# Patient Record
Sex: Male | Born: 1960
Health system: Southern US, Community
[De-identification: ages and names within clinical notes are randomized; demographics above are authoritative.]

## PROBLEM LIST (undated history)

## (undated) DIAGNOSIS — I639 Cerebral infarction, unspecified: Secondary | ICD-10-CM

## (undated) DIAGNOSIS — M1990 Unspecified osteoarthritis, unspecified site: Secondary | ICD-10-CM

## (undated) DIAGNOSIS — K920 Hematemesis: Secondary | ICD-10-CM

## (undated) DIAGNOSIS — I10 Essential (primary) hypertension: Secondary | ICD-10-CM

## (undated) DIAGNOSIS — K219 Gastro-esophageal reflux disease without esophagitis: Secondary | ICD-10-CM

## (undated) DIAGNOSIS — M720 Palmar fascial fibromatosis [Dupuytren]: Secondary | ICD-10-CM

## (undated) HISTORY — PX: TOTAL HIP ARTHROPLASTY: SHX124

## (undated) HISTORY — PX: COLONOSCOPY: SHX174

## (undated) HISTORY — DX: Gastro-esophageal reflux disease without esophagitis: K21.9

## (undated) HISTORY — DX: Cerebral infarction, unspecified: I63.9

## (undated) HISTORY — DX: Palmar fascial fibromatosis (dupuytren): M72.0

## (undated) HISTORY — DX: Unspecified osteoarthritis, unspecified site: M19.90

## (undated) HISTORY — DX: Hematemesis: K92.0

---

## 2012-07-03 ENCOUNTER — Encounter: Payer: Self-pay | Admitting: Family Medicine

## 2012-07-03 ENCOUNTER — Ambulatory Visit (INDEPENDENT_AMBULATORY_CARE_PROVIDER_SITE_OTHER): Payer: 59 | Admitting: Family Medicine

## 2012-07-03 VITALS — BP 130/90 | HR 65 | Temp 98.1°F | Ht 73.0 in | Wt 193.0 lb

## 2012-07-03 DIAGNOSIS — Z0000 Encounter for general adult medical examination without abnormal findings: Secondary | ICD-10-CM | POA: Insufficient documentation

## 2012-07-03 DIAGNOSIS — Z833 Family history of diabetes mellitus: Secondary | ICD-10-CM

## 2012-07-03 DIAGNOSIS — M720 Palmar fascial fibromatosis [Dupuytren]: Secondary | ICD-10-CM

## 2012-07-03 DIAGNOSIS — Z1211 Encounter for screening for malignant neoplasm of colon: Secondary | ICD-10-CM

## 2012-07-03 DIAGNOSIS — Z125 Encounter for screening for malignant neoplasm of prostate: Secondary | ICD-10-CM

## 2012-07-03 LAB — PSA: PSA: 0.12 ng/mL (ref 0.10–4.00)

## 2012-07-03 LAB — COMPREHENSIVE METABOLIC PANEL
ALT: 21 U/L (ref 0–53)
AST: 21 U/L (ref 0–37)
Albumin: 4.4 g/dL (ref 3.5–5.2)
Alkaline Phosphatase: 56 U/L (ref 39–117)
BUN: 9 mg/dL (ref 6–23)
CO2: 29 mEq/L (ref 19–32)
Calcium: 9.9 mg/dL (ref 8.4–10.5)
Chloride: 101 mEq/L (ref 96–112)
Creatinine, Ser: 1 mg/dL (ref 0.4–1.5)
GFR: 102.62 mL/min (ref >60.00)
Glucose, Bld: 92 mg/dL (ref 70–99)
Potassium: 4.5 mEq/L (ref 3.5–5.1)
Sodium: 140 mEq/L (ref 135–145)
Total Bilirubin: 1 mg/dL (ref 0.3–1.2)
Total Protein: 7.3 g/dL (ref 6.0–8.3)

## 2012-07-03 LAB — LIPID PANEL
Cholesterol: 192 mg/dL (ref 0–200)
HDL: 46.7 mg/dL (ref >39.00)
LDL Cholesterol: 118 mg/dL — ABNORMAL HIGH (ref 0–99)
Total CHOL/HDL Ratio: 4
Triglycerides: 137 mg/dL (ref 0.0–149.0)
VLDL: 27.4 mg/dL (ref 0.0–40.0)

## 2012-07-03 NOTE — Assessment & Plan Note (Signed)
Offered referral.  He'll call back if desired.

## 2012-07-03 NOTE — Progress Notes (Signed)
CPE- See plan.  Routine anticipatory guidance given to patient.  See health maintenance. PSA screening d/w pt.  No FH prostate cancer. D/w patient ZO:[id number] for colon cancer screening, including IFOB vs. colonoscopy.  Risks and benefits of both were discussed and patient voiced understanding.  Pt elects WUJ:WJXB.  FH DM.  No known personal hx.  Due for labs today.  Tetanus <10 years per patient.  Flu shot encouraged.  D/w pt about smoking cessation. He's planning on using Ecig.   R>L dupuytren contracture on the hands.  Offered referral, not bothersome enough to need referral at this point.   PMH and SH reviewed  Meds, vitals, and allergies reviewed.   ROS: See HPI.  Otherwise negative.    GEN: nad, alert and oriented HEENT: mucous membranes moist NECK: supple w/o LA CV: rrr. PULM: ctab, no inc wob ABD: soft, +bs EXT: no edema SKIN: no acute rash R>L dupuytren contracture on the hands noted

## 2012-07-03 NOTE — Assessment & Plan Note (Signed)
Routine anticipatory guidance given to patient. See health maintenance.  PSA screening d/w pt. No FH prostate cancer.  D/w patient UE:[id number] for colon cancer screening, including IFOB vs. colonoscopy. Risks and benefits of both were discussed and patient voiced understanding. Pt elects XBJ:YNWG.  FH DM. No known personal hx. Due for labs today.  Tetanus <10 years per patient.  Flu shot encouraged.  D/w pt about smoking cessation. He's planning on using Ecig.

## 2012-07-03 NOTE — Patient Instructions (Addendum)
Go to the lab on the way out.  We'll contact you with your lab report. I would get a flu shot each fall.   Take care.  Glad to see you.  Work on stopping smoking.

## 2012-07-10 ENCOUNTER — Other Ambulatory Visit: Payer: 59

## 2012-07-10 DIAGNOSIS — Z1211 Encounter for screening for malignant neoplasm of colon: Secondary | ICD-10-CM

## 2012-07-10 LAB — FECAL OCCULT BLOOD, IMMUNOCHEMICAL: Fecal Occult Bld: NEGATIVE

## 2012-08-02 ENCOUNTER — Telehealth: Payer: Self-pay

## 2012-08-02 NOTE — Telephone Encounter (Signed)
Pt request lab results 07/03/12. Pt did not receive letter with lab results; pt request results mailed to verified home address;done.

## 2013-06-04 ENCOUNTER — Other Ambulatory Visit: Payer: Self-pay | Admitting: Orthopedic Surgery

## 2013-06-04 DIAGNOSIS — M720 Palmar fascial fibromatosis [Dupuytren]: Secondary | ICD-10-CM

## 2013-06-04 HISTORY — DX: Palmar fascial fibromatosis (dupuytren): M72.0

## 2013-06-20 ENCOUNTER — Encounter (HOSPITAL_BASED_OUTPATIENT_CLINIC_OR_DEPARTMENT_OTHER): Payer: Self-pay | Admitting: *Deleted

## 2013-06-27 ENCOUNTER — Encounter (HOSPITAL_BASED_OUTPATIENT_CLINIC_OR_DEPARTMENT_OTHER)
Admission: RE | Disposition: A | Discharge status: Home / Self Care | Payer: Self-pay | Source: Ambulatory Visit | Attending: Orthopedic Surgery

## 2013-06-27 ENCOUNTER — Ambulatory Visit (HOSPITAL_BASED_OUTPATIENT_CLINIC_OR_DEPARTMENT_OTHER)
Admission: RE | Admit: 2013-06-27 | Discharge: 2013-06-27 | Disposition: A | Discharge status: Home / Self Care | Payer: BC Managed Care – PPO | Source: Ambulatory Visit | Attending: Orthopedic Surgery | Admitting: Orthopedic Surgery

## 2013-06-27 ENCOUNTER — Ambulatory Visit (HOSPITAL_BASED_OUTPATIENT_CLINIC_OR_DEPARTMENT_OTHER): Payer: BC Managed Care – PPO | Admitting: Anesthesiology

## 2013-06-27 ENCOUNTER — Encounter (HOSPITAL_BASED_OUTPATIENT_CLINIC_OR_DEPARTMENT_OTHER): Payer: Self-pay | Admitting: Anesthesiology

## 2013-06-27 ENCOUNTER — Encounter (HOSPITAL_BASED_OUTPATIENT_CLINIC_OR_DEPARTMENT_OTHER): Payer: Self-pay

## 2013-06-27 DIAGNOSIS — F17200 Nicotine dependence, unspecified, uncomplicated: Secondary | ICD-10-CM | POA: Insufficient documentation

## 2013-06-27 DIAGNOSIS — M20099 Other deformity of finger(s), unspecified finger(s): Secondary | ICD-10-CM | POA: Insufficient documentation

## 2013-06-27 DIAGNOSIS — K219 Gastro-esophageal reflux disease without esophagitis: Secondary | ICD-10-CM | POA: Insufficient documentation

## 2013-06-27 DIAGNOSIS — M720 Palmar fascial fibromatosis [Dupuytren]: Secondary | ICD-10-CM | POA: Insufficient documentation

## 2013-06-27 HISTORY — PX: DUPUYTREN CONTRACTURE RELEASE: SHX1478

## 2013-06-27 LAB — POCT HEMOGLOBIN-HEMACUE: Hemoglobin: 15.8 g/dL (ref 13.0–17.0)

## 2013-06-27 SURGERY — RELEASE, DUPUYTREN CONTRACTURE
Anesthesia: General | Site: Hand | Laterality: Right | Wound class: Clean

## 2013-06-27 MED ORDER — FENTANYL CITRATE 0.05 MG/ML IJ SOLN
INTRAMUSCULAR | Status: DC | PRN
  Administered 2013-06-27 (×4): 25 ug via INTRAVENOUS
  Administered 2013-06-27: 100 ug via INTRAVENOUS

## 2013-06-27 MED ORDER — LIDOCAINE HCL (CARDIAC) 20 MG/ML IV SOLN
INTRAVENOUS | Status: DC | PRN
  Administered 2013-06-27: 100 mg via INTRAVENOUS

## 2013-06-27 MED ORDER — HYDROMORPHONE HCL 2 MG PO TABS: 2.0000 mg | ORAL_TABLET | ORAL | Status: DC | PRN

## 2013-06-27 MED ORDER — LACTATED RINGERS IV SOLN
INTRAVENOUS | Status: DC
  Administered 2013-06-27 (×2): via INTRAVENOUS

## 2013-06-27 MED ORDER — HYDROMORPHONE HCL PF 1 MG/ML IJ SOLN
0.2500 mg | INTRAMUSCULAR | Status: DC | PRN
  Administered 2013-06-27 (×2): 0.5 mg via INTRAVENOUS

## 2013-06-27 MED ORDER — CHLORHEXIDINE GLUCONATE 4 % EX LIQD: 60.0000 mL | Freq: Once | CUTANEOUS | Status: DC

## 2013-06-27 MED ORDER — FENTANYL CITRATE 0.05 MG/ML IJ SOLN: 50.0000 ug | INTRAMUSCULAR | Status: DC | PRN

## 2013-06-27 MED ORDER — MIDAZOLAM HCL 2 MG/2ML IJ SOLN: 1.0000 mg | INTRAMUSCULAR | Status: DC | PRN

## 2013-06-27 MED ORDER — DOXYCYCLINE HYCLATE 100 MG PO TABS: 100.0000 mg | ORAL_TABLET | Freq: Two times a day (BID) | ORAL | Status: DC

## 2013-06-27 MED ORDER — DEXAMETHASONE SODIUM PHOSPHATE 10 MG/ML IJ SOLN
INTRAMUSCULAR | Status: DC | PRN
  Administered 2013-06-27: 10 mg via INTRAVENOUS

## 2013-06-27 MED ORDER — LIDOCAINE HCL 2 % IJ SOLN
INTRAMUSCULAR | Status: DC | PRN
  Administered 2013-06-27: 5 mL

## 2013-06-27 MED ORDER — SILVER SULFADIAZINE 1 % EX CREA
TOPICAL_CREAM | CUTANEOUS | Status: DC | PRN
  Administered 2013-06-27: 1 via TOPICAL

## 2013-06-27 MED ORDER — OXYCODONE HCL 5 MG/5ML PO SOLN: 5.0000 mg | Freq: Once | ORAL | Status: AC | PRN

## 2013-06-27 MED ORDER — ONDANSETRON HCL 4 MG/2ML IJ SOLN
INTRAMUSCULAR | Status: DC | PRN
  Administered 2013-06-27: 4 mg via INTRAVENOUS

## 2013-06-27 MED ORDER — PROPOFOL 10 MG/ML IV BOLUS
INTRAVENOUS | Status: DC | PRN
  Administered 2013-06-27: 200 mg via INTRAVENOUS

## 2013-06-27 MED ORDER — MIDAZOLAM HCL 2 MG/ML PO SYRP: 12.0000 mg | ORAL_SOLUTION | Freq: Once | ORAL | Status: DC | PRN

## 2013-06-27 MED ORDER — MIDAZOLAM HCL 5 MG/5ML IJ SOLN
INTRAMUSCULAR | Status: DC | PRN
  Administered 2013-06-27: 2 mg via INTRAVENOUS

## 2013-06-27 MED ORDER — ONDANSETRON HCL 4 MG/2ML IJ SOLN: 4.0000 mg | Freq: Once | INTRAMUSCULAR | Status: DC | PRN

## 2013-06-27 MED ORDER — OXYCODONE HCL 5 MG PO TABS
5.0000 mg | ORAL_TABLET | Freq: Once | ORAL | Status: AC | PRN
  Administered 2013-06-27: 5 mg via ORAL

## 2013-06-27 SURGICAL SUPPLY — 53 items
BANDAGE ADHESIVE 1X3 (GAUZE/BANDAGES/DRESSINGS) IMPLANT
BANDAGE CONFORM 3  STR LF (GAUZE/BANDAGES/DRESSINGS) IMPLANT
BANDAGE ELASTIC 3 VELCRO ST LF (GAUZE/BANDAGES/DRESSINGS) ×2 IMPLANT
BANDAGE GAUZE ELAST BULKY 4 IN (GAUZE/BANDAGES/DRESSINGS) ×4 IMPLANT
BLADE MINI RND TIP GREEN BEAV (BLADE) ×2 IMPLANT
BLADE SURG 15 STRL LF DISP TIS (BLADE) ×4 IMPLANT
BLADE SURG 15 STRL SS (BLADE) ×4
BNDG ESMARK 4X9 LF (GAUZE/BANDAGES/DRESSINGS) ×2 IMPLANT
BRUSH SCRUB EZ PLAIN DRY (MISCELLANEOUS) ×2 IMPLANT
CLOTH BEACON ORANGE TIMEOUT ST (SAFETY) ×2 IMPLANT
CORDS BIPOLAR (ELECTRODE) ×2 IMPLANT
COVER MAYO STAND STRL (DRAPES) ×2 IMPLANT
COVER TABLE BACK 60X90 (DRAPES) ×2 IMPLANT
CUFF TOURNIQUET SINGLE 18IN (TOURNIQUET CUFF) ×2 IMPLANT
DECANTER SPIKE VIAL GLASS SM (MISCELLANEOUS) IMPLANT
DRAPE EXTREMITY T 121X128X90 (DRAPE) ×2 IMPLANT
DRAPE SURG 17X23 STRL (DRAPES) ×2 IMPLANT
DRSG EMULSION OIL 3X3 NADH (GAUZE/BANDAGES/DRESSINGS) ×4 IMPLANT
GLOVE BIO SURGEON STRL SZ 6.5 (GLOVE) ×2 IMPLANT
GLOVE BIO SURGEON STRL SZ7.5 (GLOVE) ×2 IMPLANT
GLOVE BIOGEL M STRL SZ7.5 (GLOVE) IMPLANT
GLOVE BIOGEL PI IND STRL 7.0 (GLOVE) ×3 IMPLANT
GLOVE BIOGEL PI IND STRL 8 (GLOVE) ×1 IMPLANT
GLOVE BIOGEL PI INDICATOR 7.0 (GLOVE) ×3
GLOVE BIOGEL PI INDICATOR 8 (GLOVE) ×1
GLOVE ECLIPSE 6.5 STRL STRAW (GLOVE) ×4 IMPLANT
GLOVE ORTHO TXT STRL SZ7.5 (GLOVE) ×2 IMPLANT
GOWN BRE IMP PREV XXLGXLNG (GOWN DISPOSABLE) ×4 IMPLANT
GOWN PREVENTION PLUS XLARGE (GOWN DISPOSABLE) ×6 IMPLANT
LOOP VESSEL MAXI BLUE (MISCELLANEOUS) IMPLANT
NEEDLE 27GAX1X1/2 (NEEDLE) ×2 IMPLANT
NEEDLE HYPO 25X1 1.5 SAFETY (NEEDLE) ×2 IMPLANT
NS IRRIG 1000ML POUR BTL (IV SOLUTION) ×2 IMPLANT
PACK BASIN DAY SURGERY FS (CUSTOM PROCEDURE TRAY) ×2 IMPLANT
PAD CAST 3X4 CTTN HI CHSV (CAST SUPPLIES) ×1 IMPLANT
PADDING CAST ABS 4INX4YD NS (CAST SUPPLIES) ×1
PADDING CAST ABS COTTON 4X4 ST (CAST SUPPLIES) ×1 IMPLANT
PADDING CAST COTTON 3X4 STRL (CAST SUPPLIES) ×1
SPLINT PLASTER CAST XFAST 3X15 (CAST SUPPLIES) ×8 IMPLANT
SPLINT PLASTER XTRA FASTSET 3X (CAST SUPPLIES) ×8
SPONGE GAUZE 4X4 12PLY (GAUZE/BANDAGES/DRESSINGS) ×2 IMPLANT
STOCKINETTE 4X48 STRL (DRAPES) ×2 IMPLANT
STRIP CLOSURE SKIN 1/2X4 (GAUZE/BANDAGES/DRESSINGS) IMPLANT
SUT ETHILON 5 0 P 3 18 (SUTURE) ×2
SUT NYLON ETHILON 5-0 P-3 1X18 (SUTURE) ×2 IMPLANT
SUT SILK 4 0 PS 2 (SUTURE) ×4 IMPLANT
SUT VIC AB 4-0 P2 18 (SUTURE) IMPLANT
SYR 3ML 23GX1 SAFETY (SYRINGE) IMPLANT
SYR BULB 3OZ (MISCELLANEOUS) ×2 IMPLANT
SYR CONTROL 10ML LL (SYRINGE) ×2 IMPLANT
TOWEL OR 17X24 6PK STRL BLUE (TOWEL DISPOSABLE) ×4 IMPLANT
TRAY DSU PREP LF (CUSTOM PROCEDURE TRAY) ×2 IMPLANT
UNDERPAD 30X30 INCONTINENT (UNDERPADS AND DIAPERS) ×2 IMPLANT

## 2013-06-27 NOTE — H&P (Signed)
Michael Chung is an 52 y.o. male.   Chief Complaint: Bilateral palmar fibromatosis and Dupuytren's diathesis HPI: Aggressive bilateral palmar fibromatosis leading to finger contractures during the past 12 months.         He is a smoker. There is no history of diabetes mellitus over this a positive family history of type 2          diabetes.  Past Medical History  Diagnosis Date  . Dupuytren contracture 06/2013    right long, ring, small fingers  . GERD (gastroesophageal reflux disease)     uses OTC as needed    Past Surgical History  Procedure Laterality Date  . No past surgeries      Family History  Problem Relation Age of Onset  . Hypertension Father   . Diabetes Father   . Diabetes Mother   . Hypertension Mother    Social History:  reports that he has been smoking Cigarettes.  He has a 10 pack-year smoking history. He has never used smokeless tobacco. He reports that he does not drink alcohol or use illicit drugs.  Allergies: No Known Allergies  No prescriptions prior to admission    Results for orders placed during the hospital encounter of 06/27/13 (from the past 48 hour(s))  POCT HEMOGLOBIN-HEMACUE     Status: None   Collection Time    06/27/13 10:22 AM      Result Value Range   Hemoglobin 15.8  13.0 - 17.0 g/dL   No results found.  Review of Systems  Constitutional: Negative.   HENT: Negative.   Eyes: Negative.   Respiratory: Negative.   Cardiovascular: Negative.   Gastrointestinal: Negative.   Genitourinary: Negative.   Musculoskeletal:       Bilateral Dupuytren's contracture of palms and fingers  Skin: Negative.   Neurological: Negative.   Endo/Heme/Allergies: Negative.   Psychiatric/Behavioral: Negative.     Blood pressure 159/108, pulse 74, temperature 97.7 F (36.5 C), temperature source Oral, resp. rate 20, height 6\' 1"  (1.854 m), weight 86.183 kg (190 lb), SpO2 99.00%. Physical Exam  Constitutional: He is oriented to person, place, and time. He  appears well-developed and well-nourished.  HENT:  Head: Normocephalic and atraumatic.  Eyes: Conjunctivae and EOM are normal. Pupils are equal, round, and reactive to light.  Neck: Neck supple.  Cardiovascular: Normal rate, regular rhythm and normal heart sounds.   Respiratory: Effort normal and breath sounds normal.  GI: Soft. Bowel sounds are normal.  Musculoskeletal: Normal range of motion.  Except for limited extension of fingers bilaterally due to palmar fibromatosis  Neurological: He is alert and oriented to person, place, and time. No cranial nerve deficit.  Skin: Skin is warm and dry.  Bilateral significant palmar fibromatosis  Psychiatric: He has a normal mood and affect. His behavior is normal. Judgment and thought content normal.     Assessment/Plan Bilateral aggressive palmar  fibromatosis consistent with Dupuytren's contracture in smoker and evidence of Dupuytren's diathesis.  Plan:  Resection of Dupuytren's palm R. fibromatosis from right hand. The surgery aftercare potential risks and benefits including severe keloid scar formation have been discussed in detail. There is a small risk for a dystrophic type response i.e. CRPS type I or 2 following the surgery. Questions have been invited and answered in detail.  Zeriah Baysinger JR,Damyan Corne V 06/27/2013, 11:27 AM

## 2013-06-27 NOTE — Anesthesia Postprocedure Evaluation (Signed)
  Anesthesia Post-op Note  Patient: Michael Chung  Procedure(s) Performed: Procedure(s): EXCISION DUPUYTRENS RIGHT LONG AND RING  FINGERS (Right)  Patient Location: PACU  Anesthesia Type:General  Level of Consciousness: awake, alert  and oriented  Airway and Oxygen Therapy: Patient Spontanous Breathing  Post-op Pain: mild  Post-op Assessment: Post-op Vital signs reviewed  Post-op Vital Signs: Reviewed  Complications: No apparent anesthesia complications

## 2013-06-27 NOTE — Op Note (Signed)
[id number] 

## 2013-06-27 NOTE — Brief Op Note (Signed)
06/27/2013  1:45 PM  PATIENT:  Michael Chung  52 y.o. male  PRE-OPERATIVE DIAGNOSIS:  DUPUYTRENS CONTRACTURE RIGHT HAND  POST-OPERATIVE DIAGNOSIS:  DUPUYTRENS CONTRACTURE RIGHT HAND  PROCEDURE:  Procedure(s): EXCISION DUPUYTRENS RIGHT LONG AND RING  FINGERS (Right)  SURGEON:  Surgeon(s) and Role:    * Wyn Forster., MD - Primary    * Tami Ribas, MD - Assisting  PHYSICIAN ASSISTANT:   ASSISTANTS: Surgical technician   ANESTHESIA:   general  EBL:  Total I/O In: 1300 [I.V.:1300] Out: -   BLOOD ADMINISTERED:none  DRAINS: none   LOCAL MEDICATIONS USED:  XYLOCAINE   SPECIMEN:  Biopsy / Limited Resection  DISPOSITION OF SPECIMEN:  PATHOLOGY  COUNTS:  YES  TOURNIQUET:   Total Tourniquet Time Documented: Upper Arm (Right) - 82 minutes Total: Upper Arm (Right) - 82 minutes   DICTATION: .Other Dictation: Dictation Number [id number]  PLAN OF CARE: Discharge to home after PACU  PATIENT DISPOSITION:  PACU - hemodynamically stable.   Delay start of Pharmacological VTE agent (>24hrs) due to surgical blood loss or risk of bleeding: not applicable

## 2013-06-27 NOTE — Anesthesia Procedure Notes (Signed)
Procedure Name: LMA Insertion Date/Time: 06/27/2013 11:48 AM Performed by: Caren Macadam Pre-anesthesia Checklist: Patient identified, Emergency Drugs available, Suction available and Patient being monitored Patient Re-evaluated:Patient Re-evaluated prior to inductionOxygen Delivery Method: Circle System Utilized Preoxygenation: Pre-oxygenation with 100% oxygen Intubation Type: IV induction Ventilation: Mask ventilation without difficulty LMA: LMA inserted LMA Size: 5.0 Number of attempts: 1 Airway Equipment and Method: bite block Placement Confirmation: positive ETCO2 and breath sounds checked- equal and bilateral Tube secured with: Tape Dental Injury: Teeth and Oropharynx as per pre-operative assessment

## 2013-06-27 NOTE — Anesthesia Preprocedure Evaluation (Signed)
Anesthesia Evaluation  Patient identified by MRN, date of birth, ID band Patient awake    Reviewed: Allergy & Precautions, H&P , NPO status , Patient's Chart, lab work & pertinent test results  Airway Mallampati: I TM Distance: >3 FB Neck ROM: Full    Dental  (+) Teeth Intact and Dental Advisory Given   Pulmonary  breath sounds clear to auscultation        Cardiovascular Rhythm:Regular Rate:Normal     Neuro/Psych    GI/Hepatic GERD-  Controlled,  Endo/Other    Renal/GU      Musculoskeletal   Abdominal   Peds  Hematology   Anesthesia Other Findings   Reproductive/Obstetrics                           Anesthesia Physical Anesthesia Plan  ASA: II  Anesthesia Plan: General   Post-op Pain Management:    Induction: Intravenous  Airway Management Planned: LMA  Additional Equipment:   Intra-op Plan:   Post-operative Plan: Extubation in OR  Informed Consent: I have reviewed the patients History and Physical, chart, labs and discussed the procedure including the risks, benefits and alternatives for the proposed anesthesia with the patient or authorized representative who has indicated his/her understanding and acceptance.   Dental advisory given  Plan Discussed with: CRNA, Anesthesiologist and Surgeon  Anesthesia Plan Comments:         Anesthesia Quick Evaluation

## 2013-06-27 NOTE — Transfer of Care (Signed)
Immediate Anesthesia Transfer of Care Note  Patient: Michael Chung  Procedure(s) Performed: Procedure(s): EXCISION DUPUYTRENS RIGHT LONG AND RING  FINGERS (Right)  Patient Location: PACU  Anesthesia Type:General  Level of Consciousness: awake, alert  and oriented  Airway & Oxygen Therapy: Patient Spontanous Breathing and Patient connected to face mask oxygen  Post-op Assessment: Report given to PACU RN, Post -op Vital signs reviewed and stable and Patient moving all extremities  Post vital signs: Reviewed and stable  Complications: No apparent anesthesia complications

## 2013-06-28 ENCOUNTER — Encounter (HOSPITAL_BASED_OUTPATIENT_CLINIC_OR_DEPARTMENT_OTHER): Payer: Self-pay | Admitting: Orthopedic Surgery

## 2013-06-28 NOTE — Op Note (Signed)
Michael Chung, SUAREZ NO.:  [id number]  MEDICAL RECORD NO.:  [id number]  LOCATION:                               FACILITY:  [facility]  PHYSICIAN:  Katy Fitch. Jerico Grisso, M.D. DATE OF BIRTH:  09-26-1961  DATE OF PROCEDURE:  06/27/2013 DATE OF DISCHARGE:  06/27/2013                              OPERATIVE REPORT   PREOPERATIVE DIAGNOSES:  Profound Dupuytren's diathesis involving right and left hands with very significant flexion contracture of right long finger proximal interphalangeal joint and right ring finger proximal interphalangeal joint, metacarpophalangeal joint and significant pretendinous cord in the palm.  POSTOPERATIVE DIAGNOSES:  Profound Dupuytren's diathesis involving right and left hands with very significant flexion contracture of right long finger proximal interphalangeal joint and right ring finger proximal interphalangeal joint, metacarpophalangeal joint and significant pretendinous cord in the palm.  OPERATION: 1. Resection of complex Dupuytren's palmar fibromatosis from right     ring finger and palm with V-Y advancement flap closure. 2. Resection of Dupuytren's palmar fibromatosis from right long     finger.  OPERATING SURGEON:  Katy Fitch. Halei Hanover, MD.  ASSISTING:  Betha Loa, MD  ANESTHESIA:  General by LMA.  SUPERVISING ANESTHESIOLOGIST:  Sheldon Silvan, M.D.  INDICATIONS:  Michael Chung is a 52 year old right-hand dominant gentleman who is referred from [facility] for evaluation and management of severe Dupuytren's diathesis affecting his hands bilaterally.  Mr. Troiano developed this Dupuytren's prior to age 52 and now has a very significant flexion contracture of the MP and PIP joints of the right ring finger and right long finger MP joint.  He had minor extension of the small finger through the pretendinous fibers the natatory ligaments.  Complicating Mr. Karge presentation are number of unusual circumstances.  He is  of [place] heritage and has very severe Dupuytren's disease.  He does not have a known history of keloid formation.  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  We had a detailed informed consent in our office during which we explained to him the Dupuytren's palmar fibromatosis is a poorly understood genetically driven predicament.  He understands that despite our surgical efforts, he will have persistent and recurrent disease in his hands and may have disease recurring in the long finger and ring finger we anticipate correcting today as well as his other fingers and thumb and may require revision surgery in the future, skin grafts and may still have flexion contractures and trouble with his predicament.  He understands he is at significant risk for developing a CRPS type 1 or 2 response following surgery, possible infection, and hand stiffness.  He fully understands that this is a genetic disorder that we are treating in a mechanical manner.  We do not have a scientific means of turning off Dupuytren's disease.  We had discussed alternative strategies of treating this including collagenase injections and needle aponeurotomy.  The complexity of his illness as well as the magnitude of his fibromatosis in my judgment precluded approaching his predicament in this manner.  We had repeat informed consent in the holding area of the [facility].  His wife was present for both discussions.  After informed consent, he was brought to the operating room at this time.  Dr. Ivin Booty provided detailed anesthesia informed consent.  Questions were invited and answered in detail.  PROCEDURE IN DETAIL:  Vayden Weinand was brought to room 1 of the [facility] and placed in supine position on the operating table.  Following anesthesia informed consent, general anesthesia by LMA technique was induced under Dr. Ivin Booty'  direct supervision.  The right hand and arm were prepped with Betadine soap and solution, sterilely draped.  A pneumatic tourniquet was applied to the proximal right brachium.  Following exsanguination of the forearm and brachium, the arterial tourniquet was inflated to 220 mmHg.  Following routine surgical time- out, we planned modified Brunner incisions with switch backs across the proximal finger flexion crease for the long and ring fingers.  An extended palmar incision was accomplished to remove the pretendinous fibers to the level of carpal tunnel for the ring finger.  Procedure commenced with skin incision.  Mr. Mcfarlane has profound callous formation, more than 3 mm thick.  The palmar fibromatosis, he presented with, was infiltrating into the dermis extensively.  We performed meticulous dissection between the dermal layer and a fibrous nodules followed by elevation of skin flaps.  We identified all named vessels and nerves, and preserved them throughout dissection.  Extensive nodular disease was removed from the pretendinous fibers to the ring finger as well as lateral fascial sheet involvement in the ring finger and a distal central cord and several lateral extensions.  He had 1 spiral band on the radial aspect of the ring finger.  In the long finger, he had extensive involvement on the radial aspect of the PIP joint with a central cord spiral band and natatory ligament involvement.  After all the pathologic fascia was excised, we were able to fully extend the MP joints beyond neutral.  We were able to extend the PIP joints to neutral.  He was noted to have tightness of his oblique retinacular ligaments for all the fingers.  With the PIP joint in maximum extension, I could only flex the DIP joints 25-30 degrees each finger.  There was a small cord in the thumb-index webspace that after release of the fascia in the palm was no longer tight, therefore we left this  undisturbed.  After dissection was completed, electrocautery was used under saline for hemostasis.  The wounds were meticulously repaired with corner sutures of 5-0 nylon and a few interrupted sutures in the limbs of the flaps. The palmar flaps for the ring finger and the proximal segment of the ring finger were extended by V-Y advancement flap technique. Satisfactory closure was achieved.  The wound was then dressed with Silvadene Adaptic, sterile gauze, sterile Kerlix, sterile Webril, and a volar plaster splint maintaining the MP joints in full extension.  There were no apparent complications. Tourniquet was released with immediate capillary refill to all fingers.  For aftercare, Mr. Wurzer was provided prescriptions for Dilaudid 2 mg 1 or 2 tablets p.o. q.4-6 hours p.r.n. pain, 30 tablets without refill, also Motrin 600 mg 1 p.o. q.6 hours p.r.n. pain, 30 tablets, and doxycycline 100 mg p.o. b.i.d. x4 days as a prophylactic antibiotic.     Katy Fitch Shyana Kulakowski, M.D.     RVS/MEDQ  D:  06/27/2013  T:  06/28/2013  Job:  [id number]

## 2013-10-30 ENCOUNTER — Ambulatory Visit: Payer: Self-pay | Admitting: Family Medicine

## 2013-10-30 ENCOUNTER — Telehealth: Payer: Self-pay | Admitting: Family Medicine

## 2013-10-30 NOTE — Telephone Encounter (Signed)
Patient Information:  Caller Name: Cobi  Phone: [phone]  Patient: Michael Chung, Michael Chung  Gender: Male  DOB: [birth year]  Age: 52 Years  PCP: Crawford Givens Clelia Croft) [facility])  Office Follow Up:  Does the office need to follow up with this patient?: No  Instructions For The Office: N/A  RN Note:  Painless, clear penile discharge.    Symptoms  Reason For Call & Symptoms: "Difficulty in private parts" with "different feeling" in penis and clear fluid  leaking from penis.  Denies dysuria, frequency or urgency.  Reviewed Health History In EMR: Yes  Reviewed Medications In EMR: Yes  Reviewed Allergies In EMR: Yes  Reviewed Surgeries / Procedures: Yes  Date of Onset of Symptoms: 10/28/2013  Guideline(s) Used:  Penis and Scrotum Symptoms  Disposition Per Guideline:   See Today or Tomorrow in Office  Reason For Disposition Reached:   Patient is worried about a sexually transmitted disease (STD)  Advice Given:  Genital Hygiene:  Keep your penis and scrotal area clean. Wash once daily with unscented soap and water.  Keep your penis and scrotal area dry. Wear cotton underwear.  Call Back If:  Rash spreads or becomes worse  Fever occurs  You become worse.  Patient Will Follow Care Advice:  YES  Appointment Scheduled:  10/30/2013 18:00:00 Appointment Scheduled Provider:  Crawford Givens Clelia Croft) [facility])

## 2013-11-07 ENCOUNTER — Encounter: Payer: Self-pay | Admitting: Family Medicine

## 2013-11-07 ENCOUNTER — Ambulatory Visit (INDEPENDENT_AMBULATORY_CARE_PROVIDER_SITE_OTHER): Payer: BC Managed Care – PPO | Admitting: Family Medicine

## 2013-11-07 VITALS — BP 162/92 | HR 79 | Temp 98.0°F | Wt 193.5 lb

## 2013-11-07 DIAGNOSIS — R369 Urethral discharge, unspecified: Secondary | ICD-10-CM | POA: Insufficient documentation

## 2013-11-07 LAB — POCT URINALYSIS DIPSTICK
Bilirubin, UA: NEGATIVE
Blood, UA: NEGATIVE
Glucose, UA: NEGATIVE
Ketones, UA: NEGATIVE
Leukocytes, UA: NEGATIVE
Nitrite, UA: NEGATIVE
Protein, UA: NEGATIVE
Spec Grav, UA: 1.01
Urobilinogen, UA: NEGATIVE
pH, UA: 6

## 2013-11-07 LAB — RPR

## 2013-11-07 NOTE — Patient Instructions (Signed)
Go to the lab on the way out.  We'll contact you with your lab report. Take care.   

## 2013-11-07 NOTE — Progress Notes (Signed)
Pre-visit discussion using our clinic review tool. No additional management support is needed unless otherwise documented below in the visit note.  Penile discharge once.  Noted last week.  Clear.  Thicker than water.  No dysuria.  No FCNAVD.  No rash.  No blood seen in urine.  No sores.  No testicle pain.  Monogamous relationship.  No h/o STDs.    Meds, vitals, and allergies reviewed.   ROS: See HPI.  Otherwise, noncontributory.  nad Testes bilaterally descended without nodularity, tenderness or masses. No scrotal masses or lesions. No penis lesions or urethral discharge.

## 2013-11-07 NOTE — Assessment & Plan Note (Addendum)
Normal exam.  Single episode.  Check GC chlam, HIV RPR.  Micro pending.  U/a wnl.  D/w pt.  Avoid sexual contact at least until labs resulted.  He agrees.  No tx yet as labs are pending.  Micro exam done- no trich noted.

## 2013-11-08 LAB — GC/CHLAMYDIA PROBE AMP, URINE
Chlamydia, Swab/Urine, PCR: NEGATIVE
GC Probe Amp, Urine: NEGATIVE

## 2013-11-08 LAB — HIV ANTIBODY (ROUTINE TESTING W REFLEX): HIV: NONREACTIVE

## 2014-06-25 ENCOUNTER — Ambulatory Visit (INDEPENDENT_AMBULATORY_CARE_PROVIDER_SITE_OTHER)
Admission: RE | Admit: 2014-06-25 | Discharge: 2014-06-25 | Disposition: A | Discharge status: Home / Self Care | Payer: BC Managed Care – PPO | Source: Ambulatory Visit | Attending: Family Medicine | Admitting: Family Medicine

## 2014-06-25 ENCOUNTER — Ambulatory Visit (INDEPENDENT_AMBULATORY_CARE_PROVIDER_SITE_OTHER): Payer: BC Managed Care – PPO | Admitting: Family Medicine

## 2014-06-25 ENCOUNTER — Encounter: Payer: Self-pay | Admitting: Family Medicine

## 2014-06-25 VITALS — BP 122/84 | HR 78 | Temp 98.6°F | Wt 177.8 lb

## 2014-06-25 DIAGNOSIS — K5900 Constipation, unspecified: Secondary | ICD-10-CM

## 2014-06-25 DIAGNOSIS — Z800 Family history of malignant neoplasm of digestive organs: Secondary | ICD-10-CM

## 2014-06-25 DIAGNOSIS — Z1211 Encounter for screening for malignant neoplasm of colon: Secondary | ICD-10-CM

## 2014-06-25 NOTE — Patient Instructions (Signed)
Get over the counter miralax and take it daily.  Go to the lab on the way out.  We'll contact you with your xray report. [facility] will call about your referral. Take care.  Glad to see you.

## 2014-06-25 NOTE — Progress Notes (Signed)
Pre visit review using our clinic review tool, if applicable. No additional management support is needed unless otherwise documented below in the visit note.  He got constipated recently.  He tried some OTC meds w/o relief.  Has been going on for about 3 weeks.  Has BM urge for minimal output.  Sore from pushing, straining.  Tried enema w/o relief.  Still passing gas.  No FCNAVD.  No deep abd pain, mainly abd wall pain.  Had noted BRBPR recently.  Appetite is fair.  Weight loss noted, started about 1 month ago.  No diet changes.  No h/o colonoscopy.    FH colon cancer noted, due for screening.   Meds, vitals, and allergies reviewed.   ROS: See HPI.  Otherwise, noncontributory.  nad ncat Mmm rrr ctab Abd soft, not ttp, normal BS Rectal exam w/o gross blood, small ext hemorrhoid.  Xray reviewed.

## 2014-06-26 DIAGNOSIS — K5900 Constipation, unspecified: Secondary | ICD-10-CM | POA: Insufficient documentation

## 2014-06-26 DIAGNOSIS — Z800 Family history of malignant neoplasm of digestive organs: Secondary | ICD-10-CM | POA: Insufficient documentation

## 2014-06-26 NOTE — Assessment & Plan Note (Signed)
See notes on imaging, reviewed imaging. Nontoxic.  Still passing flatus.  Start miralax.  Given FH colon cancer, refer to GI.  He agrees.

## 2014-09-26 ENCOUNTER — Ambulatory Visit: Payer: Self-pay | Admitting: Unknown Physician Specialty

## 2014-09-26 LAB — HM COLONOSCOPY: HM Colonoscopy: NEGATIVE

## 2014-10-20 ENCOUNTER — Encounter: Payer: Self-pay | Admitting: Family Medicine

## 2014-11-04 ENCOUNTER — Encounter: Payer: Self-pay | Admitting: Family Medicine

## 2014-11-12 ENCOUNTER — Encounter: Payer: Self-pay | Admitting: Family Medicine

## 2015-02-04 ENCOUNTER — Encounter: Payer: Self-pay | Admitting: Family Medicine

## 2015-02-04 ENCOUNTER — Ambulatory Visit (INDEPENDENT_AMBULATORY_CARE_PROVIDER_SITE_OTHER): Payer: BLUE CROSS/BLUE SHIELD | Admitting: Family Medicine

## 2015-02-04 VITALS — BP 130/84 | HR 68 | Temp 98.3°F | Wt 190.5 lb

## 2015-02-04 DIAGNOSIS — K920 Hematemesis: Secondary | ICD-10-CM

## 2015-02-04 DIAGNOSIS — N529 Male erectile dysfunction, unspecified: Secondary | ICD-10-CM

## 2015-02-04 DIAGNOSIS — Z7189 Other specified counseling: Secondary | ICD-10-CM

## 2015-02-04 DIAGNOSIS — Z125 Encounter for screening for malignant neoplasm of prostate: Secondary | ICD-10-CM

## 2015-02-04 DIAGNOSIS — Z0000 Encounter for general adult medical examination without abnormal findings: Secondary | ICD-10-CM

## 2015-02-04 DIAGNOSIS — Z23 Encounter for immunization: Secondary | ICD-10-CM

## 2015-02-04 MED ORDER — OMEPRAZOLE 40 MG PO CPDR: 40.0000 mg | DELAYED_RELEASE_CAPSULE | Freq: Every day | ORAL | Status: DC

## 2015-02-04 MED ORDER — SUCRALFATE 1 G PO TABS: 1.0000 g | ORAL_TABLET | Freq: Three times a day (TID) | ORAL | Status: DC

## 2015-02-04 NOTE — Progress Notes (Signed)
Pre visit review using our clinic review tool, if applicable. No additional management support is needed unless otherwise documented below in the visit note.  CPE- See plan.  Routine anticipatory guidance given to patient.  See health maintenance. Tetanus 2016 Flu encouraged PNA not due Shingles not due.  Colonoscopy 2015 PSA d/w pt.  Will check today.  Risk and benefits d/w pt.   Living will d/w pt.  Wife designated if patient were incapacitated.   Diet and exercise.   Diet is good.  Exercise mainly at work, physical job.   Lipids prev okay on check.  D/w pt.   Recently on indomethacin and tramadol for hip pain.  Last few days with vomiting and upper abd pain.  Not vomiting red blood but had coffee ground emesis (small amount per patient).  No blood in stools, still with normal BMs.  Less abd pain today, less now than prev.  Last vomited 3PM today but that was clear.    ED noted.  D/w pt.  This is on hold for now, given the above.  D/w pt.  He agrees.   PMH and SH reviewed  Meds, vitals, and allergies reviewed.   ROS: See HPI.  Otherwise negative.    GEN: nad, alert and oriented HEENT: mucous membranes moist NECK: supple w/o LA CV: rrr. PULM: ctab, no inc wob ABD: soft, +bs, abd not ttp, no rebound.   EXT: no edema SKIN: no acute rash

## 2015-02-04 NOTE — Patient Instructions (Addendum)
Go to the lab on the way out.  We'll contact you with your lab report. Start taking prilosec 40mg  a day.   Take carafate 4 times a day.   Avoid all indomethacin, ibuprofen, and aleve.  If you have sudden abdominal pain or bloody/black vomiting, then go to the ER.  Update me in 1-2 days.  [name] will call about your referral. Clear liquid diet in the meantime.  Take care.  Glad to see you.

## 2015-02-05 ENCOUNTER — Telehealth: Payer: Self-pay | Admitting: Family Medicine

## 2015-02-05 ENCOUNTER — Encounter: Payer: Self-pay | Admitting: Family Medicine

## 2015-02-05 DIAGNOSIS — K920 Hematemesis: Secondary | ICD-10-CM | POA: Insufficient documentation

## 2015-02-05 DIAGNOSIS — N529 Male erectile dysfunction, unspecified: Secondary | ICD-10-CM | POA: Insufficient documentation

## 2015-02-05 DIAGNOSIS — Z7189 Other specified counseling: Secondary | ICD-10-CM | POA: Insufficient documentation

## 2015-02-05 LAB — CBC WITH DIFFERENTIAL/PLATELET
Basophils Absolute: 0 10*3/uL (ref 0.0–0.1)
Basophils Relative: 0.3 % (ref 0.0–3.0)
Eosinophils Absolute: 0.1 10*3/uL (ref 0.0–0.7)
Eosinophils Relative: 0.7 % (ref 0.0–5.0)
HCT: 43.5 % (ref 39.0–52.0)
Hemoglobin: 15.1 g/dL (ref 13.0–17.0)
Lymphocytes Relative: 22.7 % (ref 12.0–46.0)
Lymphs Abs: 1.9 10*3/uL (ref 0.7–4.0)
MCHC: 34.5 g/dL (ref 30.0–36.0)
MCV: 90.8 fl (ref 78.0–100.0)
Monocytes Absolute: 0.8 10*3/uL (ref 0.1–1.0)
Monocytes Relative: 9.9 % (ref 3.0–12.0)
Neutro Abs: 5.6 10*3/uL (ref 1.4–7.7)
Neutrophils Relative %: 66.4 % (ref 43.0–77.0)
Platelets: 259 10*3/uL (ref 150.0–400.0)
RBC: 4.79 Mil/uL (ref 4.22–5.81)
RDW: 13.1 % (ref 11.5–15.5)
WBC: 8.4 10*3/uL (ref 4.0–10.5)

## 2015-02-05 LAB — COMPREHENSIVE METABOLIC PANEL
ALT: 10 U/L (ref 0–53)
AST: 13 U/L (ref 0–37)
Albumin: 4.5 g/dL (ref 3.5–5.2)
Alkaline Phosphatase: 71 U/L (ref 39–117)
BUN: 19 mg/dL (ref 6–23)
CO2: 35 mEq/L — ABNORMAL HIGH (ref 19–32)
Calcium: 9.8 mg/dL (ref 8.4–10.5)
Chloride: 99 mEq/L (ref 96–112)
Creatinine, Ser: 0.99 mg/dL (ref 0.40–1.50)
GFR: 101.59 mL/min (ref >60.00)
Glucose, Bld: 104 mg/dL — ABNORMAL HIGH (ref 70–99)
Potassium: 3.7 mEq/L (ref 3.5–5.1)
Sodium: 139 mEq/L (ref 135–145)
Total Bilirubin: 0.6 mg/dL (ref 0.2–1.2)
Total Protein: 7.4 g/dL (ref 6.0–8.3)

## 2015-02-05 LAB — LIPASE: Lipase: 34 U/L (ref 11.0–59.0)

## 2015-02-05 LAB — PSA: PSA: 0.13 ng/mL (ref 0.10–4.00)

## 2015-02-05 NOTE — Assessment & Plan Note (Signed)
Concern for upper GIB esp with Nsaid hx.  Refer to GI.  Stop all nsaids.   In meantime, start PPI and carafate.  Check labs today, d/w pt.  abd exam benign now.  Okay for outpatient f/u, with clear liquid diet.   If worsening, to ER.  He agrees.

## 2015-02-05 NOTE — Telephone Encounter (Signed)
Called the patient to set up GI Referral and he wanted to let you know that he started the medicine yesterday and today he is feeling much better and not having any abdominal pain today.

## 2015-02-05 NOTE — Telephone Encounter (Signed)
emmi mailed  °

## 2015-02-05 NOTE — Assessment & Plan Note (Signed)
Defer tx for now, given other issues today.

## 2015-02-05 NOTE — Assessment & Plan Note (Signed)
Routine anticipatory guidance given to patient.  See health maintenance. Tetanus 2016 Flu encouraged PNA not due Shingles not due.  Colonoscopy 2015 PSA d/w pt.  Will check today.  Risk and benefits d/w pt.   Living will d/w pt.  Wife designated if patient were incapacitated.   Diet and exercise.   Diet is good.  Exercise mainly at work, physical job.   Lipids prev okay on check.  D/w pt.

## 2015-02-06 ENCOUNTER — Telehealth: Payer: Self-pay

## 2015-02-06 NOTE — Telephone Encounter (Signed)
Patient aware of lab results and plan.

## 2015-02-06 NOTE — Telephone Encounter (Signed)
-----   Message from Joaquim NamGraham S Duncan, MD sent at 02/06/2015  5:50 AM EST ----- Call pt.  Labs are fine. Continue as planned at the OV and keep the appointment with GI.  Thanks.

## 2015-02-06 NOTE — Telephone Encounter (Signed)
Noted, thanks. Glad to hear.

## 2015-03-03 ENCOUNTER — Telehealth: Payer: Self-pay | Admitting: *Deleted

## 2015-03-03 ENCOUNTER — Telehealth: Payer: Self-pay | Admitting: Family Medicine

## 2015-03-03 NOTE — Telephone Encounter (Signed)
Patient says he found the letter that said he needed to let us know when [facility] is [facility].  It is April 25th at 8:30 a.m  [facility]

## 2015-03-03 NOTE — Telephone Encounter (Signed)
Patient also says he was of the understanding that you were going to write him a prescription for his sex drive.  [facility] [address].

## 2015-03-03 NOTE — Telephone Encounter (Signed)
Pt has gotten his testing papers for next month. Please call pt at [phone].

## 2015-03-04 MED ORDER — SILDENAFIL CITRATE 20 MG PO TABS: 60.0000 mg | ORAL_TABLET | Freq: Every day | ORAL | Status: DC | PRN

## 2015-03-04 NOTE — Telephone Encounter (Signed)
Sildenafil rx sent.  Start with 3 tabs- may increase to 5 tabs at a time.  Cheapest version of viagra available.   Don't use more than one time a day.   Thanks.

## 2015-03-04 NOTE — Telephone Encounter (Signed)
Patient notified as instructed by telephone and verbalized understanding. 

## 2015-03-05 NOTE — Telephone Encounter (Signed)
Erroneous encounter

## 2015-03-05 NOTE — Telephone Encounter (Signed)
[facility], [place]ung for PA on Sildenafil 20 mg. Tablet.  PA submitted through CMM, awaiting response.

## 2015-03-12 ENCOUNTER — Other Ambulatory Visit: Payer: Self-pay | Admitting: *Deleted

## 2015-03-12 MED ORDER — SILDENAFIL CITRATE 20 MG PO TABS: 60.0000 mg | ORAL_TABLET | Freq: Every day | ORAL | Status: DC | PRN

## 2015-03-12 NOTE — Telephone Encounter (Signed)
Notify pt.  He'll likely have to pay cash.  This is still likely the cheapest form available.  Thanks.

## 2015-03-12 NOTE — Telephone Encounter (Signed)
PA for Sildenafil 20 mg denied.  Denial letter placed in Dr. Lianne Bushyuncan's IN Box.

## 2015-03-12 NOTE — Telephone Encounter (Signed)
Patient was advised and given the option of [facility] #50 for $75.00 or [facility] #50 for $80.  Patient asked that the Rx be sent to [facility] [place], sent.

## 2015-03-30 LAB — SURGICAL PATHOLOGY

## 2015-05-27 ENCOUNTER — Other Ambulatory Visit: Payer: Self-pay | Admitting: Family Medicine

## 2015-05-27 NOTE — Telephone Encounter (Signed)
Patient notified as instructed by telephone and verbalized understanding. 

## 2015-05-27 NOTE — Telephone Encounter (Signed)
Received refill request electronically from pharmacy. Last refill 02/04/15 #40/1 Last office visit 02/04/15 Is it okay to refill medication?

## 2015-05-27 NOTE — Telephone Encounter (Signed)
Please clarify with patient.  Is he still needing this?  Was it an auto refill request?  How is he feeling?  Thanks .

## 2015-05-27 NOTE — Telephone Encounter (Signed)
Spoke to patient and was advised that he is still using this medication as needed. Patient stated that he is having some pain every now and than and wants to keep some on hand if he needs it.

## 2015-05-27 NOTE — Telephone Encounter (Signed)
Sent.  Thanks.  If the pain continues/if needing med often, then I want to see him back.  Thanks.

## 2015-10-02 ENCOUNTER — Telehealth: Payer: Self-pay

## 2015-10-02 NOTE — Telephone Encounter (Signed)
Please schedule O/V for pre-op clearance - per Dr. Para Marchuncan.

## 2015-10-02 NOTE — Telephone Encounter (Signed)
Appointment 11/28 Pt aware

## 2015-11-02 ENCOUNTER — Ambulatory Visit (INDEPENDENT_AMBULATORY_CARE_PROVIDER_SITE_OTHER): Payer: BLUE CROSS/BLUE SHIELD | Admitting: Family Medicine

## 2015-11-02 ENCOUNTER — Encounter: Payer: Self-pay | Admitting: Family Medicine

## 2015-11-02 VITALS — BP 142/80 | HR 77 | Temp 98.2°F | Ht 73.0 in | Wt 202.8 lb

## 2015-11-02 DIAGNOSIS — Z23 Encounter for immunization: Secondary | ICD-10-CM

## 2015-11-02 DIAGNOSIS — M1612 Unilateral primary osteoarthritis, left hip: Secondary | ICD-10-CM | POA: Diagnosis not present

## 2015-11-02 DIAGNOSIS — Z029 Encounter for administrative examinations, unspecified: Secondary | ICD-10-CM

## 2015-11-02 MED ORDER — SILDENAFIL CITRATE 20 MG PO TABS: 60.0000 mg | ORAL_TABLET | Freq: Every day | ORAL | Status: DC | PRN

## 2015-11-02 NOTE — Patient Instructions (Signed)
Let me talk to cardiology and we'll be in touch. Take care.  Glad to see you.

## 2015-11-02 NOTE — Progress Notes (Signed)
Pre visit review using our clinic review tool, if applicable. No additional management support is needed unless otherwise documented below in the visit note.  L hip pain, replacement planned.  Can't tolerate nsaids.   Smoking ~1/2 PPD, d/w pt about cessation Flu shot done today.  No h/o CAD, MI, CVA, TIA.  No CHF, HTN.  No h/o DM2.   No CP, SOB, BLE edema.   L hip pain is the limiting issue for patient, not a cardiopulmonary source.    PMH and SH reviewed  ROS: See HPI, otherwise noncontributory.  Meds, vitals, and allergies reviewed.   GEN: nad, alert and oriented HEENT: mucous membranes moist NECK: supple w/o LA CV: rrr.  no murmur PULM: ctab, no inc wob ABD: soft, +bs EXT: no edema SKIN: no acute rash Limping from pain at L hip.

## 2015-11-03 ENCOUNTER — Encounter: Payer: Self-pay | Admitting: Family Medicine

## 2015-11-03 DIAGNOSIS — M1610 Unilateral primary osteoarthritis, unspecified hip: Secondary | ICD-10-CM | POA: Insufficient documentation

## 2015-11-03 NOTE — Assessment & Plan Note (Addendum)
EKG reviewed. I wasn't expecting to find RBBB on EKG.  D/w pt.  No acute issues but I want to discuss with cards for input.  Had his EKG been totally normal, then he would have been appropriately low risk for surgery.  He is okay for outpatient f/u, but his surgery status is not determined yet. >25 minutes spent in face to face time with patient, >50% spent in counselling or coordination of care.

## 2015-11-09 ENCOUNTER — Other Ambulatory Visit: Payer: Self-pay | Admitting: Family Medicine

## 2015-11-09 DIAGNOSIS — I4510 Unspecified right bundle-branch block: Secondary | ICD-10-CM

## 2015-11-09 DIAGNOSIS — R9431 Abnormal electrocardiogram [ECG] [EKG]: Secondary | ICD-10-CM

## 2015-11-11 ENCOUNTER — Other Ambulatory Visit: Payer: BLUE CROSS/BLUE SHIELD

## 2015-11-13 ENCOUNTER — Other Ambulatory Visit: Payer: Self-pay

## 2015-11-13 ENCOUNTER — Ambulatory Visit (INDEPENDENT_AMBULATORY_CARE_PROVIDER_SITE_OTHER): Payer: BLUE CROSS/BLUE SHIELD

## 2015-11-13 ENCOUNTER — Other Ambulatory Visit: Payer: Self-pay | Admitting: Family Medicine

## 2015-11-13 DIAGNOSIS — R9431 Abnormal electrocardiogram [ECG] [EKG]: Secondary | ICD-10-CM

## 2015-11-13 DIAGNOSIS — R931 Abnormal findings on diagnostic imaging of heart and coronary circulation: Secondary | ICD-10-CM

## 2015-11-16 ENCOUNTER — Other Ambulatory Visit: Payer: Self-pay | Admitting: *Deleted

## 2015-11-16 ENCOUNTER — Encounter
Admission: RE | Admit: 2015-11-16 | Discharge: 2015-11-16 | Disposition: A | Discharge status: Home / Self Care | Payer: BLUE CROSS/BLUE SHIELD | Source: Ambulatory Visit | Attending: Specialist | Admitting: Specialist

## 2015-11-16 ENCOUNTER — Other Ambulatory Visit: Payer: BLUE CROSS/BLUE SHIELD

## 2015-11-16 DIAGNOSIS — Z01812 Encounter for preprocedural laboratory examination: Secondary | ICD-10-CM | POA: Insufficient documentation

## 2015-11-16 LAB — PROTIME-INR
INR: 1.06
Prothrombin Time: 14 seconds (ref 11.4–15.0)

## 2015-11-16 LAB — CBC
HCT: 43.9 % (ref 40.0–52.0)
Hemoglobin: 14.9 g/dL (ref 13.0–18.0)
MCH: 31.2 pg (ref 26.0–34.0)
MCHC: 33.9 g/dL (ref 32.0–36.0)
MCV: 91.8 fL (ref 80.0–100.0)
Platelets: 221 10*3/uL (ref 150–440)
RBC: 4.78 MIL/uL (ref 4.40–5.90)
RDW: 13.6 % (ref 11.5–14.5)
WBC: 5.1 10*3/uL (ref 3.8–10.6)

## 2015-11-16 LAB — BASIC METABOLIC PANEL
Anion gap: 5 (ref 5–15)
BUN: 19 mg/dL (ref 6–20)
CO2: 30 mmol/L (ref 22–32)
Calcium: 9.5 mg/dL (ref 8.9–10.3)
Chloride: 106 mmol/L (ref 101–111)
Creatinine, Ser: 0.94 mg/dL (ref 0.61–1.24)
GFR calc Af Amer: >60 mL/min (ref >60)
GFR calc non Af Amer: >60 mL/min (ref >60)
Glucose, Bld: 83 mg/dL (ref 65–99)
Potassium: 3.9 mmol/L (ref 3.5–5.1)
Sodium: 141 mmol/L (ref 135–145)

## 2015-11-16 LAB — URINALYSIS COMPLETE WITH MICROSCOPIC (ARMC ONLY)
Bacteria, UA: NONE SEEN
Bilirubin Urine: NEGATIVE
Glucose, UA: NEGATIVE mg/dL
Hgb urine dipstick: NEGATIVE
Ketones, ur: NEGATIVE mg/dL
Leukocytes, UA: NEGATIVE
Nitrite: NEGATIVE
Protein, ur: NEGATIVE mg/dL
Specific Gravity, Urine: 1.021 (ref 1.005–1.030)
pH: 5 (ref 5.0–8.0)

## 2015-11-16 LAB — SURGICAL PCR SCREEN
MRSA, PCR: NEGATIVE
Staphylococcus aureus: NEGATIVE

## 2015-11-16 MED ORDER — SILDENAFIL CITRATE 20 MG PO TABS: 60.0000 mg | ORAL_TABLET | Freq: Every day | ORAL | Status: DC | PRN

## 2015-11-16 NOTE — Pre-Procedure Instructions (Signed)
Pt has an appt with a cardiologist scheduled by his PCP related to a change in a recent EKG.  This RN called Dr. Rondel BatonMiller's office to request a pain prescription for pt due to him having to stop ibuprofen preop and not being able to tolerate hip pain on Tylenol.  Dr. Hyacinth MeekerMiller or his nurse will call pt. back.

## 2015-11-16 NOTE — Telephone Encounter (Signed)
[facility] cash price.

## 2015-11-16 NOTE — Patient Instructions (Signed)
  Your procedure is scheduled on: Wednesday Dec. 21, 2016. Report to Same Day Surgery. To find out your arrival time please call [phone] between 1PM - 3PM on Tuesday Dec. 20, 2016.  Remember: Instructions that are not followed completely may result in serious medical risk, up to and including death, or upon the discretion of your surgeon and anesthesiologist your surgery may need to be rescheduled.    _x___ 1. Do not eat food or drink liquids after midnight. No gum chewing or hard candies.     ____ 2. No Alcohol for 24 hours before or after surgery.   ____ 3. Bring all medications with you on the day of surgery if instructed.    __x__ 4. Notify your doctor if there is any change in your medical condition     (cold, fever, infections).     Do not wear jewelry, make-up, hairpins, clips or nail polish.  Do not wear lotions, powders, or perfumes. You may wear deodorant.  Do not shave 48 hours prior to surgery. Men may shave face and neck.  Do not bring valuables to the hospital.    [facility] is not responsible for any belongings or valuables.               Contacts, dentures or bridgework may not be worn into surgery.  Leave your suitcase in the car. After surgery it may be brought to your room.  For patients admitted to the hospital, discharge time is determined by your treatment team.   Patients discharged the day of surgery will not be allowed to drive home.    Please read over the following fact sheets that you were given:   [facility] Pre[facility]  ____ Take these medicines the morning of surgery with A SIP OF WATER: None     ____ Fleet Enema (as directed)   __x__ Use CHG Soap as directed  ____ Use inhalers on the day of surgery  ____ Stop metformin 2 days prior to surgery    ____ Take 1/2 of usual insulin dose the night before surgery and none on the morning of surgery.   ____ Stop Coumadin/Plavix/aspirin on   __x__ Stop Anti-inflammatories such  Ibuprofen 7-10 days prior to surgery.  OK to take Tylenol for pain.   ____ Stop supplements until after surgery.    ____ Bring C-Pap to the hospital.

## 2015-11-17 LAB — ABO/RH: ABO/RH(D): B POS

## 2015-11-19 ENCOUNTER — Encounter: Payer: Self-pay | Admitting: Cardiovascular Disease

## 2015-11-19 ENCOUNTER — Ambulatory Visit (INDEPENDENT_AMBULATORY_CARE_PROVIDER_SITE_OTHER): Payer: BLUE CROSS/BLUE SHIELD | Admitting: Cardiovascular Disease

## 2015-11-19 VITALS — BP 144/88 | HR 76 | Ht 73.0 in | Wt 202.0 lb

## 2015-11-19 DIAGNOSIS — I4510 Unspecified right bundle-branch block: Secondary | ICD-10-CM | POA: Diagnosis not present

## 2015-11-19 DIAGNOSIS — Z01810 Encounter for preprocedural cardiovascular examination: Secondary | ICD-10-CM

## 2015-11-19 DIAGNOSIS — Z720 Tobacco use: Secondary | ICD-10-CM | POA: Diagnosis not present

## 2015-11-19 DIAGNOSIS — Z01818 Encounter for other preprocedural examination: Secondary | ICD-10-CM | POA: Diagnosis not present

## 2015-11-19 DIAGNOSIS — F17200 Nicotine dependence, unspecified, uncomplicated: Secondary | ICD-10-CM | POA: Insufficient documentation

## 2015-11-19 DIAGNOSIS — Z0000 Encounter for general adult medical examination without abnormal findings: Secondary | ICD-10-CM

## 2015-11-19 NOTE — Progress Notes (Signed)
Patient ID: Michael Chung, male    DOB: March 14, 1961, 54 y.o.   MRN: [id number]  HPI Comments: [name] is a pleasant 54 year old gentleman with long history of smoking who continues to smoke one half pack per day, no prior cardiac history, no diabetes, who presents for preoperative evaluation and for evaluation of abnormal EKG.  Recently seen by Dr. Para Marchuncan Found to have right bundle branch block on EKG Echocardiogram is ordered that shows normal ejection fraction, no other abnormalities  He is very active at baseline, denies any symptoms concerning for angina. Somewhat limited secondary to severe hip pain on heavy exertion. Denies any significant leg edema, shortness of breath on exertion  EKG on today's visit shows normal sinus rhythm with right bundle branch block, rate 76 bpm  Review of lab work came shows total cholesterol in the 190 range several years ago Continues to smoke one half pack per day  No prior CT scan imaging of the chest or stress testing available for review    Allergies  Allergen Reactions  . Nsaids Other (See Comments)    H/o coffee ground emesis    No current outpatient prescriptions on file prior to visit.   No current facility-administered medications on file prior to visit.    Past Medical History  Diagnosis Date  . Dupuytren contracture 06/2013    right long, ring, small fingers  . GERD (gastroesophageal reflux disease)     uses OTC as needed  . Coffee ground emesis     2016 after nsaid use  . Arthritis     Past Surgical History  Procedure Laterality Date  . Dupuytren contracture release Right 06/27/2013    Procedure: EXCISION DUPUYTRENS RIGHT LONG AND RING  FINGERS;  Surgeon: Wyn Forsterobert V Sypher Jr., MD;  Location: [facility];  Service: Orthopedics;  Laterality: Right;    Social History  reports that he has been smoking Cigarettes.  He has a 20 pack-year smoking history. He has never used smokeless tobacco. He reports that he  does not drink alcohol or use illicit drugs.  Family History family history includes Colon cancer in his brother, brother, and father; Diabetes in his father and mother; Heart Problems in his father; Hypertension in his father and mother. There is no history of Prostate cancer.   Review of Systems  Constitutional: Negative.   Respiratory: Negative.   Cardiovascular: Negative.   Gastrointestinal: Negative.   Musculoskeletal: Positive for arthralgias.  Neurological: Negative.   Hematological: Negative.   Psychiatric/Behavioral: Negative.   All other systems reviewed and are negative.   BP 144/88 mmHg  Pulse 76  Ht 6\' 1"  (1.854 m)  Wt 202 lb (91.627 kg)  BMI 26.66 kg/m2  Physical Exam  Constitutional: He is oriented to person, place, and time. He appears well-developed and well-nourished.  HENT:  Head: Normocephalic.  Nose: Nose normal.  Mouth/Throat: Oropharynx is clear and moist.  Eyes: Conjunctivae are normal. Pupils are equal, round, and reactive to light.  Neck: Normal range of motion. Neck supple. No JVD present.  Cardiovascular: Normal rate, regular rhythm, normal heart sounds and intact distal pulses.  Exam reveals no gallop and no friction rub.   No murmur heard. Pulmonary/Chest: Effort normal and breath sounds normal. No respiratory distress. He has no wheezes. He has no rales. He exhibits no tenderness.  Abdominal: Soft. Bowel sounds are normal. He exhibits no distension. There is no tenderness.  Musculoskeletal: Normal range of motion. He exhibits no edema or tenderness.  Lymphadenopathy:    He has no cervical adenopathy.  Neurological: He is alert and oriented to person, place, and time. Coordination normal.  Skin: Skin is warm and dry. No rash noted. No erythema.  Psychiatric: He has a normal mood and affect. His behavior is normal. Judgment and thought content normal.

## 2015-11-19 NOTE — Assessment & Plan Note (Signed)
We have encouraged him to continue to work on weaning his cigarettes and smoking cessation. He will continue to work on this and does not want any assistance with chantix.  

## 2015-11-19 NOTE — Patient Instructions (Signed)
You are doing well. No medication changes were made.  Research "CT coronary Calcium Score" on google image   Please call us if you have new issues that need to be addressed before your next appt.

## 2015-11-19 NOTE — Assessment & Plan Note (Signed)
EKG with right bundle branch block, in the current setting with no significant symptoms, benign finding. Normal echocardiogram. No further workup needed

## 2015-11-19 NOTE — Assessment & Plan Note (Signed)
Acceptable risk for upcoming total hip replacement Asymptomatic, normal echocardiogram, essentially benign EKG, no further testing needed

## 2015-11-19 NOTE — Assessment & Plan Note (Signed)
We did discuss various screening modalities for coronary artery disease. Suggested he consider CT coronary calcium scoring to help guide cholesterol management, and the urgency for smoking cessation. He will think about this and call us back if he would like to schedule

## 2015-11-20 ENCOUNTER — Telehealth: Payer: Self-pay

## 2015-11-20 NOTE — Telephone Encounter (Signed)
Received cardiac clearance request for pt to proceed w/ Left Total Hip replacement on 11/25/15 w/ [facility]. Per Dr. Mariah MillingGollan, pt is acceptable risk and is cleared to proceed w/ surgery. Faxed to [phone].

## 2015-11-24 NOTE — H&P (Signed)
TOTAL HIP ADMISSION H&P  Patient is admitted for left total hip arthroplasty.  Subjective:  Chief Complaint: left hip pain  HPI: Michael Chung, 54 y.o. male, has a history of pain and functional disability in the left hip(s) due to arthritis and patient has failed non-surgical conservative treatments for greater than 12 weeks to include NSAID's and/or analgesics, flexibility and strengthening excercises and activity modification.  Onset of symptoms was gradual starting 3 years ago with gradually worsening course since that time.The patient noted no past surgery on the left hip(s).  Patient currently rates pain in the left hip at 7 out of 10 with activity. Patient has night pain, worsening of pain with activity and weight bearing, trendelenberg gait and pain that interfers with activities of daily living. Patient has evidence of subchondral cysts, subchondral sclerosis, periarticular osteophytes and joint space narrowing by imaging studies. This condition presents safety issues increasing the risk of falls. This patient has had no prior surgery.  There is no current active infection.  Patient Active Problem List   Diagnosis Date Noted  . Smoker 11/19/2015  . Preoperative cardiovascular examination 11/19/2015  . RBBB 11/09/2015  . Osteoarthritis of left hip 11/03/2015  . Coffee ground emesis 02/05/2015  . ED (erectile dysfunction) 02/05/2015  . Advance care planning 02/05/2015  . Unspecified constipation 06/26/2014  . FH: colon cancer 06/26/2014  . Routine general medical examination at a health care facility 07/03/2012  . Dupuytren contracture 07/03/2012   Past Medical History  Diagnosis Date  . Dupuytren contracture 06/2013    right long, ring, small fingers  . GERD (gastroesophageal reflux disease)     uses OTC as needed  . Coffee ground emesis     2016 after nsaid use  . Arthritis     Past Surgical History  Procedure Laterality Date  . Dupuytren contracture release Right  06/27/2013    Procedure: EXCISION DUPUYTRENS RIGHT LONG AND RING  FINGERS;  Surgeon: Wyn Forsterobert V Sypher Jr., MD;  Location: [facility];  Service: Orthopedics;  Laterality: Right;    No prescriptions prior to admission   Allergies  Allergen Reactions  . Nsaids Other (See Comments)    H/o coffee ground emesis    Social History  Substance Use Topics  . Smoking status: Current Every Day Smoker -- 1.00 packs/day for 20 years    Types: Cigarettes  . Smokeless tobacco: Never Used  . Alcohol Use: No    Family History  Problem Relation Age of Onset  . Hypertension Father   . Diabetes Father   . Colon cancer Father   . Heart Problems Father   . Diabetes Mother   . Hypertension Mother   . Colon cancer Brother   . Colon cancer Brother   . Prostate cancer Neg Hx      Review of Systems  All other systems reviewed and are negative.   Objective:  Physical Exam  Constitutional: He is oriented to person, place, and time. He appears well-developed and well-nourished.  HENT:  Head: Normocephalic.  Eyes: Pupils are equal, round, and reactive to light.  Neck: Normal range of motion. Neck supple.  Cardiovascular: Normal rate and regular rhythm.   Respiratory: Effort normal.  GI: Soft.  Musculoskeletal:       Left hip: He exhibits decreased range of motion and decreased strength.       Arms: Neurological: He is alert and oriented to person, place, and time.  Skin: Skin is warm.  Psychiatric: He has  a normal mood and affect.    Vital signs in last 24 hours:    Labs:   Estimated body mass index is 25.14 kg/(m^2) as calculated from the following:   Height as of 06/27/13:  (1.854 m).   Weight as of 02/04/15: 86.41 kg (190 lb 8 oz).   Imaging Review Plain radiographs demonstrate severe degenerative joint disease of the left hip(s). The bone quality appears to be excellent for age and reported activity level.  Assessment/Plan:  End stage arthritis, left  hip(s)  The patient history, physical examination, clinical judgement of the provider and imaging studies are consistent with end stage degenerative joint disease of the left hip(s) and total hip arthroplasty is deemed medically necessary. The treatment options including medical management, injection therapy, arthroscopy and arthroplasty were discussed at length. The risks and benefits of total hip arthroplasty were presented and reviewed. The risks due to aseptic loosening, infection, stiffness, dislocation/subluxation,  thromboembolic complications and other imponderables were discussed.  The patient acknowledged the explanation, agreed to proceed with the plan and consent was signed. Patient is being admitted for inpatient treatment for surgery, pain control, PT, OT, prophylactic antibiotics, VTE prophylaxis, progressive ambulation and ADL's and discharge planning.The patient is planning to be discharged home with home health services

## 2015-11-25 ENCOUNTER — Encounter: Payer: Self-pay | Admitting: Emergency Medicine

## 2015-11-25 ENCOUNTER — Inpatient Hospital Stay: Payer: BLUE CROSS/BLUE SHIELD | Admitting: Certified Registered Nurse Anesthetist

## 2015-11-25 ENCOUNTER — Inpatient Hospital Stay
Admission: RE | Admit: 2015-11-25 | Discharge: 2015-11-27 | DRG: 941 | Disposition: A | Discharge status: Home Health | Payer: BLUE CROSS/BLUE SHIELD | Source: Ambulatory Visit | Attending: Specialist | Admitting: Specialist

## 2015-11-25 ENCOUNTER — Encounter
Admission: RE | Disposition: A | Discharge status: Home Health | Payer: Self-pay | Source: Ambulatory Visit | Attending: Specialist

## 2015-11-25 ENCOUNTER — Inpatient Hospital Stay: Payer: BLUE CROSS/BLUE SHIELD

## 2015-11-25 DIAGNOSIS — K219 Gastro-esophageal reflux disease without esophagitis: Secondary | ICD-10-CM | POA: Diagnosis present

## 2015-11-25 DIAGNOSIS — F17210 Nicotine dependence, cigarettes, uncomplicated: Secondary | ICD-10-CM | POA: Diagnosis present

## 2015-11-25 DIAGNOSIS — M1612 Unilateral primary osteoarthritis, left hip: Secondary | ICD-10-CM | POA: Diagnosis present

## 2015-11-25 DIAGNOSIS — Z96649 Presence of unspecified artificial hip joint: Secondary | ICD-10-CM

## 2015-11-25 HISTORY — PX: TOTAL HIP ARTHROPLASTY: SHX124

## 2015-11-25 LAB — CREATININE, SERUM
Creatinine, Ser: 0.9 mg/dL (ref 0.61–1.24)
GFR calc Af Amer: >60 mL/min (ref >60)
GFR calc non Af Amer: >60 mL/min (ref >60)

## 2015-11-25 LAB — CBC
HCT: 41.3 % (ref 40.0–52.0)
Hemoglobin: 13.6 g/dL (ref 13.0–18.0)
MCH: 30.6 pg (ref 26.0–34.0)
MCHC: 32.9 g/dL (ref 32.0–36.0)
MCV: 92.9 fL (ref 80.0–100.0)
Platelets: 204 10*3/uL (ref 150–440)
RBC: 4.44 MIL/uL (ref 4.40–5.90)
RDW: 13.5 % (ref 11.5–14.5)
WBC: 9.1 10*3/uL (ref 3.8–10.6)

## 2015-11-25 SURGERY — ARTHROPLASTY, HIP, TOTAL,POSTERIOR APPROACH
Anesthesia: General | Site: Hip | Laterality: Left | Wound class: Clean

## 2015-11-25 MED ORDER — NEOMYCIN-POLYMYXIN B GU 40-200000 IR SOLN
Status: DC | PRN
  Administered 2015-11-25: 16 mL

## 2015-11-25 MED ORDER — METOCLOPRAMIDE HCL 5 MG PO TABS: 5.0000 mg | ORAL_TABLET | Freq: Three times a day (TID) | ORAL | Status: DC | PRN

## 2015-11-25 MED ORDER — PREGABALIN 75 MG PO CAPS
75.0000 mg | ORAL_CAPSULE | Freq: Once | ORAL | Status: AC
  Administered 2015-11-25: 75 mg via ORAL

## 2015-11-25 MED ORDER — FAMOTIDINE 20 MG PO TABS
20.0000 mg | ORAL_TABLET | Freq: Once | ORAL | Status: AC
  Administered 2015-11-25: 20 mg via ORAL

## 2015-11-25 MED ORDER — METHOCARBAMOL 1000 MG/10ML IJ SOLN
500.0000 mg | Freq: Four times a day (QID) | INTRAVENOUS | Status: DC | PRN
  Filled 2015-11-25: qty 5

## 2015-11-25 MED ORDER — KETAMINE HCL 10 MG/ML IJ SOLN
INTRAMUSCULAR | Status: DC | PRN
  Administered 2015-11-25: 50 mg via INTRAVENOUS

## 2015-11-25 MED ORDER — SODIUM CHLORIDE 0.9 % IJ SOLN
INTRAMUSCULAR | Status: AC
  Filled 2015-11-25: qty 3

## 2015-11-25 MED ORDER — NEOSTIGMINE METHYLSULFATE 10 MG/10ML IV SOLN
INTRAVENOUS | Status: DC | PRN
  Administered 2015-11-25: 5 mg via INTRAVENOUS

## 2015-11-25 MED ORDER — ONDANSETRON HCL 4 MG/2ML IJ SOLN: 4.0000 mg | Freq: Four times a day (QID) | INTRAMUSCULAR | Status: DC | PRN

## 2015-11-25 MED ORDER — EPHEDRINE SULFATE 50 MG/ML IJ SOLN
INTRAMUSCULAR | Status: DC | PRN
  Administered 2015-11-25 (×2): 10 mg via INTRAVENOUS
  Administered 2015-11-25: 5 mg via INTRAVENOUS
  Administered 2015-11-25: 10 mg via INTRAVENOUS

## 2015-11-25 MED ORDER — VANCOMYCIN HCL 10 G IV SOLR
1500.0000 mg | Freq: Once | INTRAVENOUS | Status: AC
  Administered 2015-11-25: 1500 mg via INTRAVENOUS
  Filled 2015-11-25: qty 1500

## 2015-11-25 MED ORDER — FENTANYL CITRATE (PF) 100 MCG/2ML IJ SOLN: 25.0000 ug | INTRAMUSCULAR | Status: DC | PRN

## 2015-11-25 MED ORDER — DIPHENHYDRAMINE HCL 50 MG/ML IJ SOLN
INTRAMUSCULAR | Status: DC | PRN
  Administered 2015-11-25: 25 mg via INTRAVENOUS

## 2015-11-25 MED ORDER — NEOMYCIN-POLYMYXIN B GU 40-200000 IR SOLN
Status: AC
  Filled 2015-11-25: qty 20

## 2015-11-25 MED ORDER — ROCURONIUM BROMIDE 100 MG/10ML IV SOLN
INTRAVENOUS | Status: DC | PRN
  Administered 2015-11-25: 5 mg via INTRAVENOUS
  Administered 2015-11-25 (×2): 10 mg via INTRAVENOUS
  Administered 2015-11-25: 35 mg via INTRAVENOUS

## 2015-11-25 MED ORDER — SODIUM CHLORIDE 0.9 % IJ SOLN
INTRAMUSCULAR | Status: AC
  Filled 2015-11-25: qty 50

## 2015-11-25 MED ORDER — PHENYLEPHRINE HCL 10 MG/ML IJ SOLN
INTRAMUSCULAR | Status: DC | PRN
  Administered 2015-11-25: 100 ug via INTRAVENOUS
  Administered 2015-11-25: 50 ug via INTRAVENOUS
  Administered 2015-11-25 (×3): 100 ug via INTRAVENOUS
  Administered 2015-11-25 (×2): 50 ug via INTRAVENOUS

## 2015-11-25 MED ORDER — HYDROMORPHONE HCL 1 MG/ML IJ SOLN
INTRAMUSCULAR | Status: DC | PRN
  Administered 2015-11-25 (×2): 0.5 mg via INTRAVENOUS

## 2015-11-25 MED ORDER — HYDROCODONE-ACETAMINOPHEN 7.5-325 MG PO TABS
1.0000 | ORAL_TABLET | Freq: Four times a day (QID) | ORAL | Status: DC
  Administered 2015-11-25 – 2015-11-27 (×9): 1 via ORAL
  Filled 2015-11-25 (×9): qty 1

## 2015-11-25 MED ORDER — FLEET ENEMA 7-19 GM/118ML RE ENEM: 1.0000 | ENEMA | Freq: Once | RECTAL | Status: DC | PRN

## 2015-11-25 MED ORDER — ALUM & MAG HYDROXIDE-SIMETH 200-200-20 MG/5ML PO SUSP: 30.0000 mL | ORAL | Status: DC | PRN

## 2015-11-25 MED ORDER — ACETAMINOPHEN 500 MG PO TABS
1000.0000 mg | ORAL_TABLET | Freq: Four times a day (QID) | ORAL | Status: AC
  Administered 2015-11-25 – 2015-11-26 (×4): 1000 mg via ORAL
  Filled 2015-11-25 (×4): qty 2

## 2015-11-25 MED ORDER — BUPIVACAINE LIPOSOME 1.3 % IJ SUSP
INTRAMUSCULAR | Status: AC
  Filled 2015-11-25: qty 20

## 2015-11-25 MED ORDER — PREGABALIN 75 MG PO CAPS
ORAL_CAPSULE | ORAL | Status: AC
  Administered 2015-11-25: 75 mg via ORAL
  Filled 2015-11-25: qty 1

## 2015-11-25 MED ORDER — PHENOL 1.4 % MT LIQD
1.0000 | OROMUCOSAL | Status: DC | PRN
Start: 2015-11-25 — End: 2015-11-27

## 2015-11-25 MED ORDER — ACETAMINOPHEN 10 MG/ML IV SOLN
INTRAVENOUS | Status: AC
  Filled 2015-11-25: qty 100

## 2015-11-25 MED ORDER — LACTATED RINGERS IV SOLN
INTRAVENOUS | Status: DC
  Administered 2015-11-25: 07:00:00 via INTRAVENOUS

## 2015-11-25 MED ORDER — TRANEXAMIC ACID 1000 MG/10ML IV SOLN
1000.0000 mg | INTRAVENOUS | Status: AC
  Administered 2015-11-25: 1000 mg via INTRAVENOUS
  Filled 2015-11-25: qty 10

## 2015-11-25 MED ORDER — DEXAMETHASONE SODIUM PHOSPHATE 10 MG/ML IJ SOLN
INTRAMUSCULAR | Status: DC | PRN
  Administered 2015-11-25: 20 mg via INTRAVENOUS

## 2015-11-25 MED ORDER — ENOXAPARIN SODIUM 30 MG/0.3ML ~~LOC~~ SOLN
30.0000 mg | Freq: Two times a day (BID) | SUBCUTANEOUS | Status: DC
  Administered 2015-11-26 – 2015-11-27 (×3): 30 mg via SUBCUTANEOUS
  Filled 2015-11-25 (×3): qty 0.3

## 2015-11-25 MED ORDER — MIDAZOLAM HCL 5 MG/5ML IJ SOLN
INTRAMUSCULAR | Status: DC | PRN
  Administered 2015-11-25: 2 mg via INTRAVENOUS

## 2015-11-25 MED ORDER — CELECOXIB 200 MG PO CAPS
400.0000 mg | ORAL_CAPSULE | Freq: Once | ORAL | Status: AC
  Administered 2015-11-25: 400 mg via ORAL

## 2015-11-25 MED ORDER — CELECOXIB 200 MG PO CAPS
ORAL_CAPSULE | ORAL | Status: AC
  Administered 2015-11-25: 400 mg via ORAL
  Filled 2015-11-25: qty 2

## 2015-11-25 MED ORDER — BUPIVACAINE-EPINEPHRINE (PF) 0.5% -1:200000 IJ SOLN
INTRAMUSCULAR | Status: AC
  Filled 2015-11-25: qty 30

## 2015-11-25 MED ORDER — ONDANSETRON HCL 4 MG/2ML IJ SOLN
INTRAMUSCULAR | Status: DC | PRN
  Administered 2015-11-25: 4 mg via INTRAVENOUS

## 2015-11-25 MED ORDER — LIDOCAINE HCL (CARDIAC) 20 MG/ML IV SOLN
INTRAVENOUS | Status: DC | PRN
  Administered 2015-11-25: 100 mg via INTRAVENOUS

## 2015-11-25 MED ORDER — SENNA 8.6 MG PO TABS
1.0000 | ORAL_TABLET | Freq: Two times a day (BID) | ORAL | Status: DC
  Administered 2015-11-25 – 2015-11-27 (×5): 8.6 mg via ORAL
  Filled 2015-11-25 (×5): qty 1

## 2015-11-25 MED ORDER — ACETAMINOPHEN 10 MG/ML IV SOLN
INTRAVENOUS | Status: DC | PRN
  Administered 2015-11-25: 1000 mg via INTRAVENOUS

## 2015-11-25 MED ORDER — DIPHENHYDRAMINE HCL 12.5 MG/5ML PO ELIX: 12.5000 mg | ORAL_SOLUTION | ORAL | Status: DC | PRN

## 2015-11-25 MED ORDER — METOCLOPRAMIDE HCL 5 MG/ML IJ SOLN: 5.0000 mg | Freq: Three times a day (TID) | INTRAMUSCULAR | Status: DC | PRN

## 2015-11-25 MED ORDER — MENTHOL 3 MG MT LOZG
1.0000 | LOZENGE | OROMUCOSAL | Status: DC | PRN
Start: 2015-11-25 — End: 2015-11-27

## 2015-11-25 MED ORDER — SODIUM CHLORIDE 0.45 % IV SOLN
INTRAVENOUS | Status: DC
  Administered 2015-11-25: 12:00:00 via INTRAVENOUS

## 2015-11-25 MED ORDER — HYDROCODONE-ACETAMINOPHEN 10-325 MG PO TABS: 1.0000 | ORAL_TABLET | ORAL | Status: DC | PRN

## 2015-11-25 MED ORDER — SODIUM CHLORIDE 0.9 % IV SOLN
INTRAVENOUS | Status: DC | PRN
  Administered 2015-11-25: 60 mL

## 2015-11-25 MED ORDER — METHOCARBAMOL 500 MG PO TABS: 500.0000 mg | ORAL_TABLET | Freq: Four times a day (QID) | ORAL | Status: DC | PRN

## 2015-11-25 MED ORDER — ONDANSETRON HCL 4 MG PO TABS: 4.0000 mg | ORAL_TABLET | Freq: Four times a day (QID) | ORAL | Status: DC | PRN

## 2015-11-25 MED ORDER — BUPIVACAINE-EPINEPHRINE (PF) 0.25% -1:200000 IJ SOLN
INTRAMUSCULAR | Status: AC
  Filled 2015-11-25: qty 30

## 2015-11-25 MED ORDER — HYDROMORPHONE HCL 1 MG/ML IJ SOLN
INTRAMUSCULAR | Status: AC
  Filled 2015-11-25: qty 1

## 2015-11-25 MED ORDER — GLYCOPYRROLATE 0.2 MG/ML IJ SOLN
INTRAMUSCULAR | Status: DC | PRN
  Administered 2015-11-25: 0.6 mg via INTRAVENOUS

## 2015-11-25 MED ORDER — BUPIVACAINE-EPINEPHRINE (PF) 0.5% -1:200000 IJ SOLN
INTRAMUSCULAR | Status: DC | PRN
  Administered 2015-11-25: 30 mL

## 2015-11-25 MED ORDER — MORPHINE SULFATE (PF) 2 MG/ML IV SOLN
2.0000 mg | INTRAVENOUS | Status: DC | PRN
  Administered 2015-11-25: 2 mg via INTRAVENOUS
  Filled 2015-11-25: qty 1

## 2015-11-25 MED ORDER — FAMOTIDINE 20 MG PO TABS
ORAL_TABLET | ORAL | Status: AC
  Administered 2015-11-25: 20 mg via ORAL
  Filled 2015-11-25: qty 1

## 2015-11-25 MED ORDER — SODIUM CHLORIDE 0.9 % IV SOLN
1000.0000 mg | INTRAVENOUS | Status: AC
  Administered 2015-11-25: 1000 mg via INTRAVENOUS
  Filled 2015-11-25: qty 10

## 2015-11-25 MED ORDER — VANCOMYCIN HCL 10 G IV SOLR
1500.0000 mg | Freq: Two times a day (BID) | INTRAVENOUS | Status: AC
  Administered 2015-11-25 – 2015-11-26 (×3): 1500 mg via INTRAVENOUS
  Filled 2015-11-25 (×3): qty 1500

## 2015-11-25 MED ORDER — FERROUS SULFATE 325 (65 FE) MG PO TABS
325.0000 mg | ORAL_TABLET | Freq: Every day | ORAL | Status: DC
  Administered 2015-11-26 – 2015-11-27 (×2): 325 mg via ORAL
  Filled 2015-11-25 (×2): qty 1

## 2015-11-25 MED ORDER — FENTANYL CITRATE (PF) 100 MCG/2ML IJ SOLN
INTRAMUSCULAR | Status: DC | PRN
  Administered 2015-11-25 (×2): 50 ug via INTRAVENOUS

## 2015-11-25 MED ORDER — CELECOXIB 200 MG PO CAPS
200.0000 mg | ORAL_CAPSULE | Freq: Two times a day (BID) | ORAL | Status: DC
  Administered 2015-11-25 – 2015-11-27 (×5): 200 mg via ORAL
  Filled 2015-11-25 (×5): qty 1

## 2015-11-25 MED ORDER — ONDANSETRON HCL 4 MG/2ML IJ SOLN: 4.0000 mg | Freq: Once | INTRAMUSCULAR | Status: DC | PRN

## 2015-11-25 MED ORDER — SUCCINYLCHOLINE CHLORIDE 20 MG/ML IJ SOLN
INTRAMUSCULAR | Status: DC | PRN
  Administered 2015-11-25: 180 mg via INTRAVENOUS

## 2015-11-25 MED ORDER — BISACODYL 10 MG RE SUPP: 10.0000 mg | Freq: Every day | RECTAL | Status: DC | PRN

## 2015-11-25 MED ORDER — MAGNESIUM HYDROXIDE 400 MG/5ML PO SUSP: 30.0000 mL | Freq: Every day | ORAL | Status: DC | PRN

## 2015-11-25 MED ORDER — ACETAMINOPHEN 650 MG RE SUPP: 650.0000 mg | Freq: Four times a day (QID) | RECTAL | Status: DC | PRN

## 2015-11-25 MED ORDER — ACETAMINOPHEN 325 MG PO TABS: 650.0000 mg | ORAL_TABLET | Freq: Four times a day (QID) | ORAL | Status: DC | PRN

## 2015-11-25 MED ORDER — PROPOFOL 10 MG/ML IV BOLUS
INTRAVENOUS | Status: DC | PRN
Start: 2015-11-25 — End: 2015-11-25
  Administered 2015-11-25: 200 mg via INTRAVENOUS

## 2015-11-25 SURGICAL SUPPLY — 51 items
AUTOTRANSFUS HAS 1/8 (MISCELLANEOUS) ×2
BAG COUNTER SPONGE EZ (MISCELLANEOUS) IMPLANT
BLADE DEBAKEY 8.0 (BLADE) ×2 IMPLANT
BLADE SAGITTAL WIDE XTHICK NO (BLADE) ×2 IMPLANT
BRUSH STANDARD PREP 14M (MISCELLANEOUS) ×2 IMPLANT
BUR EGG ELITE 5.0 (BURR) ×2 IMPLANT
CANISTER SUCT 1200ML W/VALVE (MISCELLANEOUS) ×2 IMPLANT
CANISTER SUCT 3000ML (MISCELLANEOUS) ×2 IMPLANT
CAPT HIP TOTAL 2 ×2 IMPLANT
CATH TRAY METER 16FR LF (MISCELLANEOUS) ×2 IMPLANT
CHLORAPREP W/TINT 26ML (MISCELLANEOUS) ×2 IMPLANT
DRAPE INCISE IOBAN 66X60 STRL (DRAPES) ×2 IMPLANT
DRAPE SHEET LG 3/4 BI-LAMINATE (DRAPES) ×2 IMPLANT
DRAPE TABLE BACK 80X90 (DRAPES) ×2 IMPLANT
DRSG AQUACEL AG ADV 3.5X10 (GAUZE/BANDAGES/DRESSINGS) ×2 IMPLANT
DRSG AQUACEL AG ADV 3.5X14 (GAUZE/BANDAGES/DRESSINGS) ×2 IMPLANT
ELECT BLADE 6 FLAT ULTRCLN (ELECTRODE) ×2 IMPLANT
GAUZE PETRO XEROFOAM 1X8 (MISCELLANEOUS) ×2 IMPLANT
GAUZE SPONGE 4X4 12PLY STRL (GAUZE/BANDAGES/DRESSINGS) ×2 IMPLANT
GLOVE INDICATOR 8.0 STRL GRN (GLOVE) ×2 IMPLANT
GLOVE SURG ORTHO 8.5 STRL (GLOVE) ×2 IMPLANT
GOWN STRL REUS W/ TWL LRG LVL3 (GOWN DISPOSABLE) ×2 IMPLANT
GOWN STRL REUS W/ TWL XL LVL3 (GOWN DISPOSABLE) ×1 IMPLANT
GOWN STRL REUS W/TWL LRG LVL3 (GOWN DISPOSABLE) ×2
GOWN STRL REUS W/TWL XL LVL3 (GOWN DISPOSABLE) ×1
HANDPIECE SUCTION TUBG SURGILV (MISCELLANEOUS) ×2 IMPLANT
KIT RM TURNOVER STRD PROC AR (KITS) ×2 IMPLANT
NEEDLE SPNL 18GX3.5 QUINCKE PK (NEEDLE) ×2 IMPLANT
NS IRRIG 1000ML POUR BTL (IV SOLUTION) ×2 IMPLANT
PACK HIP PROSTHESIS (MISCELLANEOUS) ×2 IMPLANT
PAD GROUND ADULT SPLIT (MISCELLANEOUS) ×2 IMPLANT
PAD PREP 24X41 OB/GYN DISP (PERSONAL CARE ITEMS) ×2 IMPLANT
PIN STEIN THRED 5/32 (Pin) ×2 IMPLANT
SOL .9 NS 3000ML IRR  AL (IV SOLUTION) ×1
SOL .9 NS 3000ML IRR UROMATIC (IV SOLUTION) ×1 IMPLANT
SPONGE LAP 18X18 5 PK (GAUZE/BANDAGES/DRESSINGS) ×2 IMPLANT
STAPLER SKIN PROX 35W (STAPLE) ×2 IMPLANT
SUT BONE WAX W31G (SUTURE) ×2 IMPLANT
SUT DVC 2 QUILL PDO  T11 36X36 (SUTURE) ×1
SUT DVC 2 QUILL PDO T11 36X36 (SUTURE) ×1 IMPLANT
SUT ETHIBOND NAB CT1 #1 30IN (SUTURE) ×2 IMPLANT
SUT QUILL PDO 0 36 36 VIOLET (SUTURE) ×2 IMPLANT
SUT TICRON 2-0 30IN 311381 (SUTURE) ×8 IMPLANT
SUT VIC AB 2-0 CT1 27 (SUTURE) ×2
SUT VIC AB 2-0 CT1 TAPERPNT 27 (SUTURE) ×2 IMPLANT
SYR 20CC LL (SYRINGE) ×2 IMPLANT
SYR 30ML LL (SYRINGE) ×2 IMPLANT
SYSTEM AUTOTRANSFUS DUAL TROCR (MISCELLANEOUS) ×1 IMPLANT
TAPE TRANSPORE STRL 2 31045 (GAUZE/BANDAGES/DRESSINGS) ×2 IMPLANT
TUBE SUCT KAM VAC (TUBING) ×2 IMPLANT
WATER STERILE IRR 1000ML POUR (IV SOLUTION) ×2 IMPLANT

## 2015-11-25 NOTE — Op Note (Signed)
11/25/2015  10:15 AM  PATIENT:  Michael Chung   MRN: [id number]  PRE-OPERATIVE DIAGNOSIS:   Severe osteoarthritis, left hip  POST-OPERATIVE DIAGNOSIS:  Same  PROCEDURE:  Procedure(s):TOTAL HIP ARTHROPLASTY , left hip  PREOPERATIVE INDICATIONS:    Michael Chung is an 54 y.o. male who has a diagnosis of localized primary osteoarthritisof the pelvic region and thigh. and elected for surgical management after failing conservative treatment.  The risks benefits and alternatives were discussed with the patient including but not limited to the risks of nonoperative treatment, versus surgical intervention including infection, bleeding, nerve injury, periprosthetic fracture, the need for revision surgery, dislocation, leg length discrepancy, blood clots, cardiopulmonary complications, morbidity, mortality, among others, and they were willing to proceed.     OPERATIVE REPORT      SURGEON: Valinda Hoar, MD    ASSISTANT:      ,MD  ANESTHESIA: Spinal    COMPLICATIONS:  None.   DRAINS: 2 Autovacs  EBL:   400 cc                           REPLACED:   None    COMPONENTS:  Depuy AML  femur size 15 mm large stature with a 36 mm/ + 1.5 mm head ball and a Pinnacle 300 acetabular shell size 58 mm     with a neutral polyethylene liner    PROCEDURE IN DETAIL:   The patient was met in the holding area and  identified.  The appropriate hip was identified and marked at the operative site.  The patient was then transported to the OR  and  placed under spinall anesthesia.  At that point, the patient was  placed in the lateral decubitus position with the operative side up and  secured to the operating room table and all bony prominences padded.     The operative lower extremity was prepped from the iliac crest to the distal leg.  Sterile draping was performed.  Time out was performed prior to incision.      A routine posterolateral approach was utilized via sharp dissection  carried down to the  subcutaneous tissue.  Gross bleeders were Bovie coagulated.  The iliotibial band was identified and incised along the length of the skin incision.  Self-retaining retractors were  inserted.  With the hip internally rotated, the short external rotators  were identified. The piriformis and capsule was tagged with Ticron, and the hip capsule released in a T-type fashion.  A Steinmann pin was placed in the pelvis to help verify leg lengths.  The femoral neck was exposed, and I resected the femoral neck using the appropriate jig. This was performed at approximately a thumb's breadth above the lesser trochanter.    I then exposed the deep acetabulum, cleared out any tissue including the ligamentum teres.  A wing retractor was placed.  After adequate visualization, I excised the labrum, and then sequentially reamed.  I placed the trial acetabulum, which seated nicely, and then impacted the real cup into place.  Appropriate version and inclination was confirmed clinically matching their bony anatomy, and also with the use of the jig.  A trial polyethylene liner was placed and the wing retractor removed.    I then prepared the proximal femur using the cookie-cutter, the lateralizing reamer, and then sequentially reamed and broached.  A trial broach, neck, and head was utilized, and I reduced the hip and it was found to have  excellent stability with functional range of motion. The trial components were then removed, and the real polyethylene liner was placed with the lip directed posteriorly.  I then impacted the real femoral prosthesis into place into the appropriate version, slightly anteverted to the normal anatomy, and I impacted the real head ball into place. The hip was then reduced and taken through functional range of motion and found to have excellent stability. Leg lengths were restored.  I closed the T in the capsule. And repaired the posterior capsule with #2 Ticron.    I then irrigated the hip  copiously again with pulse lavage, and repaired the fascia with #2 Quill, followed by 0 Quill for the subcutaneous tissue, and staples for the skin, Steri-Strips and Aquacel .  TENS pads were applied Sponge and needle counts were correct.  The patient was then awakened and returned to PACU in stable and satisfactory condition. There were no complications.  Park Breed, MD  11/25/2015 10:15 AM

## 2015-11-25 NOTE — Transfer of Care (Signed)
Immediate Anesthesia Transfer of Care Note  Patient: Michael MountsJohn R Chung  Procedure(s) Performed: Procedure(s): TOTAL HIP ARTHROPLASTY (Left)  Patient Location: PACU  Anesthesia Type:General  Level of Consciousness: sedated  Airway & Oxygen Therapy: Patient Spontanous Breathing and Patient connected to face mask oxygen  Post-op Assessment: Report given to RN and Post -op Vital signs reviewed and stable  Post vital signs: Reviewed and stable  Last Vitals:  Filed Vitals:   11/25/15 0623 11/25/15 1025  BP: 145/98 109/72  Pulse: 73 71  Temp: 36.6 C 36.6 C  Resp: 18 18    Complications: No apparent anesthesia complications

## 2015-11-25 NOTE — Anesthesia Preprocedure Evaluation (Addendum)
Anesthesia Evaluation  Patient identified by MRN, date of birth, ID band Patient awake    Reviewed: Allergy & Precautions, NPO status , Patient's Chart, lab work & pertinent test results  Airway Mallampati: II  TM Distance: >3 FB     Dental  (+) Chipped   Pulmonary Current Smoker,    Pulmonary exam normal breath sounds clear to auscultation       Cardiovascular Normal cardiovascular exam+ dysrhythmias      Neuro/Psych negative neurological ROS  negative psych ROS   GI/Hepatic Neg liver ROS, GERD  Medicated and Controlled,Hx of GI bleed   Endo/Other  negative endocrine ROS  Renal/GU negative Renal ROS  negative genitourinary   Musculoskeletal  (+) Arthritis , Osteoarthritis,    Abdominal Normal abdominal exam  (+)   Peds negative pediatric ROS (+)  Hematology negative hematology ROS (+)   Anesthesia Other Findings   Reproductive/Obstetrics                            Anesthesia Physical Anesthesia Plan  ASA: II  Anesthesia Plan: General   Post-op Pain Management:    Induction: Intravenous  Airway Management Planned: Oral ETT  Additional Equipment:   Intra-op Plan:   Post-operative Plan: Extubation in OR  Informed Consent: I have reviewed the patients History and Physical, chart, labs and discussed the procedure including the risks, benefits and alternatives for the proposed anesthesia with the patient or authorized representative who has indicated his/her understanding and acceptance.   Dental advisory given  Plan Discussed with: CRNA and Surgeon  Anesthesia Plan Comments:         Anesthesia Quick Evaluation

## 2015-11-25 NOTE — Anesthesia Procedure Notes (Signed)
Procedure Name: Intubation Date/Time: 11/25/2015 7:46 AM Performed by: Ginger CarneMICHELET, Ihan Pat Pre-anesthesia Checklist: Patient identified, Emergency Drugs available, Suction available and Patient being monitored Patient Re-evaluated:Patient Re-evaluated prior to inductionOxygen Delivery Method: Circle system utilized Preoxygenation: Pre-oxygenation with 100% oxygen Intubation Type: IV induction Ventilation: Two handed mask ventilation required and Mask ventilation with difficulty Laryngoscope Size: Miller, 2, Mac and 4 Grade View: Grade III Tube type: Oral Tube size: 7.5 mm Number of attempts: 3 Airway Equipment and Method: Stylet Placement Confirmation: ETT inserted through vocal cords under direct vision,  positive ETCO2 and breath sounds checked- equal and bilateral Secured at: 23 cm Tube secured with: Tape Dental Injury: Teeth and Oropharynx as per pre-operative assessment  Difficulty Due To: Difficulty was unanticipated Future Recommendations: Recommend- induction with short-acting agent, and alternative techniques readily available Comments: Pt with large epiglottis and deep vocal cord entry. DVL X@ by Hansel StarlingS Vada Yellen CRNA unable to visualize. Mask ventilation the DVL x1 with  MAC4 by Dr. Noralyn Pickarroll  7.5 ETT tight to fit

## 2015-11-25 NOTE — Progress Notes (Signed)
Stacy with tens unit  Applied tens unit at 1

## 2015-11-25 NOTE — Anesthesia Postprocedure Evaluation (Signed)
Anesthesia Post Note  Patient: Michael MountsJohn R Chung  Procedure(s) Performed: Procedure(s) (LRB): TOTAL HIP ARTHROPLASTY (Left)  Patient location during evaluation: PACU Level of consciousness: awake and alert and oriented Pain management: pain level controlled Vital Signs Assessment: post-procedure vital signs reviewed and stable Respiratory status: spontaneous breathing Cardiovascular status: blood pressure returned to baseline Postop Assessment: no headache Anesthetic complications: no    Last Vitals:  Filed Vitals:   11/25/15 1420 11/25/15 1536  BP: 128/79 133/81  Pulse: 76 72  Temp: 36.7 C 36.6 C  Resp: 16 16    Last Pain:  Filed Vitals:   11/25/15 1536  PainSc: 7                  Kiara Mcdowell

## 2015-11-25 NOTE — H&P (Signed)
THE PATIENT WAS SEEN IN THE HOLDING AREA.  HISTORY, ALLERGIES, HOME MEDICATIONS AND OPERATIVE PROCEDURE WERE REVIEWED. RISKS AND BENEFITS OF SURGERY DISCUSSED WITH PATIENT AGAIN.  NO CHANGES FROM INITIAL HISTORY AND PHYSICAL NOTED.    

## 2015-11-25 NOTE — Evaluation (Signed)
Physical Therapy Evaluation Patient Details Name: Michael Chung MRN: [id number] DOB: 09-Jul-1961 Today's Date: 11/25/2015   History of Present Illness  Pt admitted for L THR on 11/25/15.   Clinical Impression  Pt is a pleasant 54 year old male who was admitted for L THR. Pt performs bed mobility with min assist and transfers/ambulation with cga and rw. Pt requires education on precautions and WB status and is very motivated to perform therapy. Pt demonstrates deficits with strength/mobility/endurance. Would benefit from skilled PT to address above deficits and promote optimal return to PLOF.      Follow Up Recommendations Home health PT    Equipment Recommendations  Rolling walker with 5" wheels;3in1 (PT)    Recommendations for Other Services       Precautions / Restrictions Precautions Precautions: Posterior Hip;Fall Precaution Booklet Issued: No Restrictions Weight Bearing Restrictions: Yes LLE Weight Bearing: Partial weight bearing      Mobility  Bed Mobility Overal bed mobility: Needs Assistance Bed Mobility: Supine to Sit     Supine to sit: Min assist     General bed mobility comments: bed mobility performed with safe technique. Cues given for sequencing as well as abiding hip precautions.  Transfers Overall transfer level: Needs assistance Equipment used: Rolling walker (2 wheeled) Transfers: Sit to/from Stand Sit to Stand: Min guard         General transfer comment: sit<>Stand performed with rw and safe technique. Cues given for correct technique  Ambulation/Gait Ambulation/Gait assistance: Min guard Ambulation Distance (Feet): 5 Feet Assistive device: Rolling walker (2 wheeled) Gait Pattern/deviations: Step-to pattern     General Gait Details: ambulated to recliner demonstrating antalgic gait pattern. Step-to gait pattern performed.  Stairs            Wheelchair Mobility    Modified Rankin (Stroke Patients Only)       Balance  Overall balance assessment: Needs assistance Sitting-balance support: Bilateral upper extremity supported Sitting balance-Leahy Scale: Good     Standing balance support: Bilateral upper extremity supported Standing balance-Leahy Scale: Good                               Pertinent Vitals/Pain Pain Assessment: 0-10 Pain Score: 4  Pain Location: L hip Pain Descriptors / Indicators: Operative site guarding Pain Intervention(s): Limited activity within patient's tolerance    Home Living Family/patient expects to be discharged to:: Private residence Living Arrangements: Spouse/significant other Available Help at Discharge: Family Type of Home: House Home Access: Stairs to enter Entrance Stairs-Rails: Right Entrance Stairs-Number of Steps: 5 Home Layout: One level Home Equipment: None      Prior Function Level of Independence: Independent               Hand Dominance        Extremity/Trunk Assessment   Upper Extremity Assessment: Overall WFL for tasks assessed           Lower Extremity Assessment:  (L LE grossly 3/5; R LE 5/5)         Communication   Communication: No difficulties  Cognition Arousal/Alertness: Awake/alert Behavior During Therapy: WFL for tasks assessed/performed Overall Cognitive Status: Within Functional Limits for tasks assessed                      General Comments      Exercises Other Exercises Other Exercises: supine ther-ex performed including L LE ankle pumps, quad sets, glut  squeezes and hip abd/add x 10 reps. CGA used for assistance      Assessment/Plan    PT Assessment Patient needs continued PT services  PT Diagnosis Difficulty walking;Abnormality of gait;Generalized weakness;Acute pain   PT Problem List Decreased strength;Decreased mobility;Decreased knowledge of use of DME;Pain  PT Treatment Interventions Gait training;Therapeutic exercise;DME instruction   PT Goals (Current goals can be found in  the Care Plan section) Acute Rehab PT Goals Patient Stated Goal: to go home PT Goal Formulation: With patient Time For Goal Achievement: 12/09/15 Potential to Achieve Goals: Good    Frequency BID   Barriers to discharge        Co-evaluation               End of Session Equipment Utilized During Treatment: Gait belt Activity Tolerance: Patient tolerated treatment well Patient left: in chair;with chair alarm set Nurse Communication: Mobility status         Time: [id number] PT Time Calculation (min) (ACUTE ONLY): 22 min   Charges:   PT Evaluation $Initial PT Evaluation Tier I: 1 Procedure PT Treatments $Therapeutic Exercise: 8-22 mins   PT G Codes:        Marylin Lathon 11/25/2015, 5:20 PM  Elizabeth PalauStephanie Drishti Pepperman, PT, DPT [phone]

## 2015-11-26 LAB — BASIC METABOLIC PANEL
Anion gap: 5 (ref 5–15)
BUN: 15 mg/dL (ref 6–20)
CO2: 24 mmol/L (ref 22–32)
Calcium: 8.7 mg/dL — ABNORMAL LOW (ref 8.9–10.3)
Chloride: 111 mmol/L (ref 101–111)
Creatinine, Ser: 0.84 mg/dL (ref 0.61–1.24)
GFR calc Af Amer: >60 mL/min (ref >60)
GFR calc non Af Amer: >60 mL/min (ref >60)
Glucose, Bld: 145 mg/dL — ABNORMAL HIGH (ref 65–99)
Potassium: 3.8 mmol/L (ref 3.5–5.1)
Sodium: 140 mmol/L (ref 135–145)

## 2015-11-26 LAB — CBC
HCT: 36.8 % — ABNORMAL LOW (ref 40.0–52.0)
Hemoglobin: 12.4 g/dL — ABNORMAL LOW (ref 13.0–18.0)
MCH: 30.9 pg (ref 26.0–34.0)
MCHC: 33.7 g/dL (ref 32.0–36.0)
MCV: 91.8 fL (ref 80.0–100.0)
Platelets: 184 10*3/uL (ref 150–440)
RBC: 4.01 MIL/uL — ABNORMAL LOW (ref 4.40–5.90)
RDW: 13.7 % (ref 11.5–14.5)
WBC: 13.3 10*3/uL — ABNORMAL HIGH (ref 3.8–10.6)

## 2015-11-26 NOTE — Progress Notes (Signed)
Subjective: 1 Day Post-Op Procedure(s) (LRB): TOTAL HIP ARTHROPLASTY (Left)    Patient reports pain as mild. Walked around [employer].  Slept well last night.  Feels well.   Objective:   VITALS:   Filed Vitals:   11/26/15 0527 11/26/15 0719  BP: 160/92 139/81  Pulse: 88 82  Temp: 98.2 F (36.8 C) 98.2 F (36.8 C)  Resp: 19 18    Neurovascular intact Sensation intact distally Intact pulses distally Dorsiflexion/Plantar flexion intact Incision: no drainage  LABS  Recent Labs  11/25/15 1249 11/26/15 0601  HGB 13.6 12.4*  HCT 41.3 36.8*  WBC 9.1 13.3*  PLT 204 184     Recent Labs  11/25/15 1249 11/26/15 0601  NA  --  140  K  --  3.8  BUN  --  15  CREATININE 0.90 0.84  GLUCOSE  --  145*    No results for input(s): LABPT, INR in the last 72 hours.   Assessment/Plan: 1 Day Post-Op Procedure(s) (LRB): TOTAL HIP ARTHROPLASTY (Left)   Advance diet Up with therapy D/C IV fluids Discharge home with home health tomorrow

## 2015-11-26 NOTE — Evaluation (Signed)
Occupational Therapy Evaluation Patient Details Name: Michael Chung MRN: [id number] DOB: 1961-02-14 Today's Date: 11/26/2015    History of Present Illness This patient is a 54 year old male who came to [facility] for a L THR (posterior)   Clinical Impression   This patient is a 54 year old male who came to [facility] for a L total hip replacement (posterior approach).  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  He had been independent with ADL and functional mobility. He now requires minimal assistance for lower body dressing. He would benefit from further Occupational Therapy for activities of daily living training to stay within hip precautions.      Follow Up Recommendations   (home with home health PT, most likely will not need OT after discharte.)    Equipment Recommendations   (will need hip kit, given list of vendors who carry hip kit.)    Recommendations for Other Services       Precautions / Restrictions Precautions Precautions: Posterior Hip;Fall Restrictions Weight Bearing Restrictions: Yes LLE Weight Bearing: Partial weight bearing      Mobility Bed Mobility                  Transfers                      Balance                                            ADL                                         General ADL Comments: Had been independent and working full time. Patient practiced lower body dressing with hip kit to stay within hip precautions, to donn, doff socks and donn pants (briefs) needed verbal cues for technique and safety. No physical assistance.     Vision     Perception     Praxis      Pertinent Vitals/Pain Pain Score: 0-No pain (Pain incereased to 2 when moving)     Hand Dominance     Extremity/Trunk Assessment Upper Extremity Assessment Upper Extremity Assessment: Overall WFL for tasks assessed   Lower Extremity Assessment Lower  Extremity Assessment: Defer to PT evaluation       Communication Communication Communication: No difficulties   Cognition Arousal/Alertness: Awake/alert Behavior During Therapy: WFL for tasks assessed/performed Overall Cognitive Status: Within Functional Limits for tasks assessed                     General Comments       Exercises       Shoulder Instructions      Home Living Family/patient expects to be discharged to:: Private residence Living Arrangements: Spouse/significant other Available Help at Discharge: Family Type of Home: House Home Access: Stairs to enter Technical brewer of Steps: 5 Entrance Stairs-Rails: Right Home Layout: One level               Home Equipment: None          Prior Functioning/Environment Level of Independence: Independent             OT Diagnosis: Acute pain   OT Problem List:  OT Treatment/Interventions: Self-care/ADL training    OT Goals(Current goals can be found in the care plan section) Acute Rehab OT Goals Patient Stated Goal: to go home OT Goal Formulation: With patient/family Time For Goal Achievement: 12/10/15 Potential to Achieve Goals: Good  OT Frequency: Min 1X/week   Barriers to D/C:            Co-evaluation              End of Session Equipment Utilized During Treatment:  (hip kit)  Activity Tolerance:   Patient left:  (left with physical therapy)   Time: [id number] OT Time Calculation (min): 22 min Charges:  OT General Charges $OT Visit: 1 Procedure OT Evaluation $Initial OT Evaluation Tier I: 1 Procedure OT Treatments $Self Care/Home Management : 8-22 mins G-Codes:    Chanan, Detwiler, MS/OTR/L  11/26/2015, 10:12 AM

## 2015-11-26 NOTE — Progress Notes (Signed)
Physical Therapy Treatment Patient Details Name: Michael Chung MRN: [id number] DOB: Aug 22, 1961 Today's Date: 11/26/2015    History of Present Illness This patient is a 54 year old male who came to [facility] for a L THR (posterior)    PT Comments    Pt is making good progress towards goals with stair training performed this date. Safe technique and good endurance demonstrated. Pt very motivated to perform therapy. Pt has met goals for acute care stay and could potentially dc home this date. RN, CM, and AD notified of therapy recommendations. Per pt reports, he has had positive BM this date. Will continue efforts as needed.  Follow Up Recommendations  Home health PT     Equipment Recommendations  Rolling walker with 5" wheels;3in1 (PT)    Recommendations for Other Services       Precautions / Restrictions Precautions Precautions: Posterior Hip;Fall Precaution Booklet Issued: Yes (comment) Restrictions Weight Bearing Restrictions: Yes LLE Weight Bearing: Partial weight bearing    Mobility  Bed Mobility Overal bed mobility: Needs Assistance Bed Mobility: Supine to Sit     Supine to sit: Min guard     General bed mobility comments: not performed as pt received in recliner  Transfers Overall transfer level: Needs assistance Equipment used: Rolling walker (2 wheeled) Transfers: Sit to/from Stand Sit to Stand: Min guard         General transfer comment: sit<>Stand performed with rw and safe technique. Cues given for correct technique  Ambulation/Gait Ambulation/Gait assistance: Supervision Ambulation Distance (Feet): 200 Feet Assistive device: Rolling walker (2 wheeled) Gait Pattern/deviations: Step-through pattern     General Gait Details: ambulated using reciprocal gait pattern and able to maintain PWB status. Safe technique performed with cues to look ahead instead of at floor. Cues given for hip precautions during L turns.    Stairs Stairs: Yes Stairs  assistance: Min guard Stair Management: One rail Right Number of Stairs: 4 General stair comments: safe technique with cues for sequencing. No pain reported with exertion. Step to gait pattern performed  Wheelchair Mobility    Modified Rankin (Stroke Patients Only)       Balance                                    Cognition Arousal/Alertness: Awake/alert Behavior During Therapy: WFL for tasks assessed/performed Overall Cognitive Status: Within Functional Limits for tasks assessed                      Exercises Other Exercises Other Exercises: seated ther-ex performed including L LE ankle pumps, quad sets, hip abd/add, SLRs, and LAQ. All ther-ex performed x 12 reps with cues for correct sequencing. Supervision given    General Comments        Pertinent Vitals/Pain Pain Assessment: No/denies pain Pain Score: 2  Pain Location: L hip-with exertion Pain Descriptors / Indicators: Operative site guarding Pain Intervention(s): Limited activity within patient's tolerance    Home Living                      Prior Function            PT Goals (current goals can now be found in the care plan section) Acute Rehab PT Goals Patient Stated Goal: to go home PT Goal Formulation: With patient Time For Goal Achievement: 12/09/15 Potential to Achieve Goals: Good Progress towards PT goals: Progressing  toward goals    Frequency  BID    PT Plan Current plan remains appropriate    Co-evaluation             End of Session Equipment Utilized During Treatment: Gait belt Activity Tolerance: Patient tolerated treatment well Patient left: in chair;with chair alarm set     Time: 1483-0735 PT Time Calculation (min) (ACUTE ONLY): 23 min  Charges:  $Gait Training: 8-22 mins $Therapeutic Exercise: 8-22 mins                    G Codes:      Michael Chung 12-07-2015, 3:08 PM  Michael Chung, PT, DPT [phone]

## 2015-11-26 NOTE — Progress Notes (Signed)
Physical Therapy Treatment Patient Details Name: Michael Chung MRN: [id number] DOB: 11-13-1961 Today's Date: 11/26/2015    History of Present Illness This patient is a 54 year old male [facility])    PT Comments    Pt is making good progress towards goals with increased ambulation distance noted this session. Pt fatigues with increased distance and starts to limp at end of session. Pt demonstrates good endurance with there-ex and has excellent pain control. Pt very motivated to work with therapy. Able to recite 1/3 hip precautions; educated and booklet given.  Follow Up Recommendations  Home health PT     Equipment Recommendations  Rolling walker with 5" wheels;3in1 (PT) (needs tall rw)    Recommendations for Other Services       Precautions / Restrictions Precautions Precautions: Posterior Hip;Fall Precaution Booklet Issued: Yes (comment) Restrictions Weight Bearing Restrictions: Yes LLE Weight Bearing: Partial weight bearing    Mobility  Bed Mobility Overal bed mobility: Needs Assistance Bed Mobility: Supine to Sit     Supine to sit: Min guard     General bed mobility comments: bed mobility performed with safe technique. Cues given for sequencing as well as abiding hip precautions.  Transfers Overall transfer level: Needs assistance Equipment used: Rolling walker (2 wheeled) Transfers: Sit to/from Stand Sit to Stand: Min guard         General transfer comment: sit<>Stand performed with rw and safe technique. Cues given for correct technique  Ambulation/Gait Ambulation/Gait assistance: Min guard Ambulation Distance (Feet): 250 Feet Assistive device: Rolling walker (2 wheeled) Gait Pattern/deviations: WFL(Within Functional Limits)     General Gait Details: ambulated using reciprocal gait pattern and able to maintain PWB status. Safe technique performed with cues to look ahead instead of at floor. Cues given for hip precautions  during L turns.    Stairs            Wheelchair Mobility    Modified Rankin (Stroke Patients Only)       Balance                                    Cognition Arousal/Alertness: Awake/alert Behavior During Therapy: WFL for tasks assessed/performed Overall Cognitive Status: Within Functional Limits for tasks assessed                      Exercises Other Exercises Other Exercises: supine ther-ex performed including L LE ankle pumps, quad sets, glut squeezes SAQ, and hip abd/add x 12 reps. CGA used for assistance    General Comments        Pertinent Vitals/Pain Pain Assessment: 0-10 Pain Score: 2  Pain Location: L hip-with exertion Pain Descriptors / Indicators: Operative site guarding Pain Intervention(s): Limited activity within patient's tolerance    Home Living Family/patient expects to be discharged to:: Private residence Living Arrangements: Spouse/significant other Available Help at Discharge: Family Type of Home: House Home Access: Stairs to enter Entrance Stairs-Rails: Right Home Layout: One level Home Equipment: None      Prior Function Level of Independence: Independent          PT Goals (current goals can now be found in the care plan section) Acute Rehab PT Goals Patient Stated Goal: to go home PT Goal Formulation: With patient Time For Goal Achievement: 12/09/15 Potential to Achieve Goals: Good Progress towards PT goals: Progressing toward goals  Frequency  BID    PT Plan Current plan remains appropriate    Co-evaluation             End of Session Equipment Utilized During Treatment: Gait belt Activity Tolerance: Patient tolerated treatment well Patient left: in chair;with chair alarm set     Time: [phone] PT Time Calculation (min) (ACUTE ONLY): 25 min  Charges:  $Gait Training: 8-22 mins $Therapeutic Exercise: 8-22 mins                    G Codes:      Michael Chung 12-25-15, 11:50  AM  Michael Chung, PT, DPT [phone]

## 2015-11-26 NOTE — Care Management Note (Addendum)
Case Management Note  Patient Details  Name: Michael Chung MRN: [id number] Date of Birth: 09/12/1961  Subjective/Objective:                  Met with patient and his wife to discuss discharge planning. He plans to return home at discharge with his wife. He does not have a preference for home health agencies. He will need a rolling walker but does not feel that he will need a 3 in 1 commode. He uses [facility] for Rx.  Action/Plan: List of home health agencies left with patient/wife. I have requested rolling walker from Will with [facility]. RNCM will continue to follow.   Expected Discharge Date:                  Expected Discharge Plan:     In-House Referral:     Discharge planning Services  CM Consult  Post Acute Care Choice:  Durable Medical Equipment, Home Health Choice offered to:  Patient, Spouse  DME Arranged:  Walker rolling DME Agency:  [facility]  HH Arranged:  PT HH Agency:     Status of Service:  In process, will continue to follow  Medicare Important Message Given:    Date Medicare IM Given:    Medicare IM give by:    Date Additional Medicare IM Given:    Additional Medicare Important Message give by:     If discussed at Long Length of Stay Meetings, dates discussed:    Additional Comments: Message left at [facility] for Rx for rolling walker. Spoke with patient's wife- they would like to use [facility] for home health. Rolling walker again requested from Will with [facility].   Angela Johnson, RN 11/26/2015, 10:29 AM  

## 2015-11-26 NOTE — Clinical Social Work Note (Signed)
CSW consulted for possible STR. PT has assessed patient and recommending HH PT. CSW will sign off. Please reconsult CSW if needed. York SpanielMonica Tommy Minichiello MSW,LCSW [phone]

## 2015-11-27 LAB — CBC
HCT: 36.5 % — ABNORMAL LOW (ref 40.0–52.0)
Hemoglobin: 12.3 g/dL — ABNORMAL LOW (ref 13.0–18.0)
MCH: 30.7 pg (ref 26.0–34.0)
MCHC: 33.8 g/dL (ref 32.0–36.0)
MCV: 90.9 fL (ref 80.0–100.0)
Platelets: 180 10*3/uL (ref 150–440)
RBC: 4.01 MIL/uL — ABNORMAL LOW (ref 4.40–5.90)
RDW: 13.9 % (ref 11.5–14.5)
WBC: 8 10*3/uL (ref 3.8–10.6)

## 2015-11-27 LAB — SURGICAL PATHOLOGY

## 2015-11-27 MED ORDER — FERROUS SULFATE 325 (65 FE) MG PO TABS: 325.0000 mg | ORAL_TABLET | Freq: Two times a day (BID) | ORAL | Status: DC

## 2015-11-27 MED ORDER — METHOCARBAMOL 500 MG PO TABS: 500.0000 mg | ORAL_TABLET | Freq: Four times a day (QID) | ORAL | Status: DC | PRN

## 2015-11-27 MED ORDER — HYDROCODONE-ACETAMINOPHEN 7.5-325 MG PO TABS: 1.0000 | ORAL_TABLET | Freq: Four times a day (QID) | ORAL | Status: DC

## 2015-11-27 MED ORDER — GABAPENTIN 400 MG PO CAPS: 400.0000 mg | ORAL_CAPSULE | Freq: Three times a day (TID) | ORAL | Status: DC

## 2015-11-27 MED ORDER — CELECOXIB 200 MG PO CAPS: 200.0000 mg | ORAL_CAPSULE | Freq: Every day | ORAL | Status: DC

## 2015-11-27 MED ORDER — ASPIRIN EC 325 MG PO TBEC: 325.0000 mg | DELAYED_RELEASE_TABLET | Freq: Every day | ORAL | Status: DC

## 2015-11-27 NOTE — Care Management (Addendum)
Rx received for rolling walker from Dr. Hyacinth MeekerMiller- I have notified Will with [facility]. Per Dr. Hyacinth MeekerMiller patient will need OPPT arranged through his office  [phone]. Tuesday 12/01/15 at 11AM post op. Patient notified. I have notified Barbara CowerJason with [facility] of this change. Rolling walker has been delivered to this room by [facility]. No further RNCM needs. Case closed.

## 2015-11-27 NOTE — Progress Notes (Signed)
Physical Therapy Treatment Patient Details Name: Michael Chung MRN: [id number] DOB: 04/18/1961 Today's Date: 11/27/2015    History of Present Illness This patient is a 54 year old male who came to [facility] for a L THR (posterior)    PT Comments    Pt is making good progress towards therapy goals and is safe to dc home. Stairs completed yesterday with safe technique. Pt educated on hip precautions especially during mobility. Good endurance with there-ex. Personal rw adjusted to pt height.  Follow Up Recommendations  Home health PT     Equipment Recommendations  Rolling walker with 5" wheels;3in1 (PT)    Recommendations for Other Services       Precautions / Restrictions Precautions Precautions: Posterior Hip;Fall Precaution Booklet Issued: Yes (comment) Restrictions Weight Bearing Restrictions: Yes LLE Weight Bearing: Partial weight bearing    Mobility  Bed Mobility Overal bed mobility: Needs Assistance Bed Mobility: Supine to Sit     Supine to sit: Supervision     General bed mobility comments: safe technique performed with limited cues for hip precautions  Transfers Overall transfer level: Needs assistance Equipment used: Rolling walker (2 wheeled) Transfers: Sit to/from Stand Sit to Stand: Supervision         General transfer comment: transfers performed with safe technique and rw. No cues required for sequencing.  Ambulation/Gait Ambulation/Gait assistance: Supervision Ambulation Distance (Feet): 250 Feet Assistive device: Rolling walker (2 wheeled) Gait Pattern/deviations: Step-through pattern     General Gait Details: ambulated using reciprocal gait pattern and able to maintain PWB status. Safe technique performed with cues to look ahead instead of at floor. Cues given for hip precautions during L turns.    Stairs            Wheelchair Mobility    Modified Rankin (Stroke Patients Only)       Balance                                     Cognition Arousal/Alertness: Awake/alert Behavior During Therapy: WFL for tasks assessed/performed Overall Cognitive Status: Within Functional Limits for tasks assessed                      Exercises Other Exercises Other Exercises: supine ther-ex performed x 15 reps including B LE ankle pumps, quad sets, glut squeezes, hip abd/add, and SAQ. Exercises performed with supervision.    General Comments        Pertinent Vitals/Pain Pain Assessment: 0-10 Pain Score: 2  Pain Location: L hip  Pain Descriptors / Indicators: Operative site guarding Pain Intervention(s): Limited activity within patient's tolerance    Home Living                      Prior Function            PT Goals (current goals can now be found in the care plan section) Acute Rehab PT Goals Patient Stated Goal: to go home PT Goal Formulation: With patient Time For Goal Achievement: 12/09/15 Potential to Achieve Goals: Good Progress towards PT goals: Progressing toward goals    Frequency  BID    PT Plan Current plan remains appropriate    Co-evaluation             End of Session Equipment Utilized During Treatment: Gait belt Activity Tolerance: Patient tolerated treatment well Patient left: in chair;with chair alarm set  Time: [id number] PT Time Calculation (min) (ACUTE ONLY): 23 min  Charges:  $Gait Training: 8-22 mins $Therapeutic Exercise: 8-22 mins                    G Codes:      Aarin Bluett 11/27/2015, 10:31 AM  Elizabeth PalauStephanie Carmichael Burdette, PT, DPT [phone]

## 2015-11-27 NOTE — Discharge Summary (Signed)
Physician Discharge Summary  Patient ID: Michael Chung MRN: [id number] DOB/AGE: August 02, 1961 54 y.o.  Admit date: 11/25/2015 Discharge date: 11/27/2015  Admission Diagnoses: Severe osteoarthritis, left hip  Discharge Diagnoses: Severe osteoarthritis, left hip Active Problems:   S/P total hip arthroplasty   Discharged Condition: good  Hospital Course: The patient underwent an AML left total hip replacement 11/25/2015.  He did very well postoperatively with minimal pain.  He made excellent progress with physical therapy.  Hemoglobin remained stable.  He is discharged home 11/27/2015.  He will start outpatient PT at my office next week.  He will be seen in my office for wound check in 1 week.  Consults: None  Significant Diagnostic Studies: radiology:  Postop hip x-ray showed excellent position of components  Treatments: antibiotics: vancomycin.  Physical therapy.  Discharge Exam: Blood pressure 131/84, pulse 76, temperature 97.7 F (36.5 C), temperature source Oral, resp. rate 18, height  (1.854 m), weight 92.987 kg (205 lb), SpO2 97 %.   Left hip stable.  Dressing dry.  Leg lengths equal.  Good motion of the hip without pain.  Disposition: 01-Home or Self Care  Discharge Instructions    Call MD for:  persistant nausea and vomiting    Complete by:  As directed      Call MD for:  redness, tenderness, or signs of infection (pain, swelling, redness, odor or green/yellow discharge around incision site)    Complete by:  As directed      Call MD for:  severe uncontrolled pain    Complete by:  As directed      Call MD for:  temperature >100.4    Complete by:  As directed      Diet - low sodium heart healthy    Complete by:  As directed      Discharge instructions    Complete by:  As directed   Partial weightbearing with walker Start PT next week Return to clinic 7-10 days     Increase activity slowly    Complete by:  As directed      No wound care    Complete by:  As  directed             Medication List    STOP taking these medications        HYDROcodone-acetaminophen 5-325 MG tablet  Commonly known as:  NORCO/VICODIN  Replaced by:  HYDROcodone-acetaminophen 7.5-325 MG tablet      TAKE these medications        aspirin EC 325 MG tablet  Take 1 tablet (325 mg total) by mouth daily.     celecoxib 200 MG capsule  Commonly known as:  CELEBREX  Take 1 capsule (200 mg total) by mouth daily.     ferrous sulfate 325 (65 FE) MG tablet  Take 1 tablet (325 mg total) by mouth 2 (two) times daily with a meal.     gabapentin 400 MG capsule  Commonly known as:  NEURONTIN  Take 1 capsule (400 mg total) by mouth 3 (three) times daily.     HYDROcodone-acetaminophen 7.5-325 MG tablet  Commonly known as:  NORCO  Take 1 tablet by mouth every 6 (six) hours.     methocarbamol 500 MG tablet  Commonly known as:  ROBAXIN  Take 1 tablet (500 mg total) by mouth every 6 (six) hours as needed for muscle spasms.           Follow-up Information    Follow up with Salaam Battershell  E, MD On 12/01/2015.   Specialty:  Specialist   Why:  11:00AM   Contact information:   [address] [place] [address] [phone]       Signed: Valinda HoarMILLER,Aisia Correira E 11/27/2015, 1:31 PM

## 2015-11-27 NOTE — Progress Notes (Signed)
Subjective: 2 Days Post-Op Procedure(s) (LRB): TOTAL HIP ARTHROPLASTY (Left)    Patient reports pain as mild. Ready to go home. Doing well with PT. Will start OPPT next week.   Objective:   VITALS:   Filed Vitals:   11/27/15 0412 11/27/15 0731  BP: 146/94 131/84  Pulse:  76  Temp:  97.7 F (36.5 C)  Resp:  18    Neurologically intact Neurovascular intact Sensation intact distally Intact pulses distally Dorsiflexion/Plantar flexion intact Incision: no drainage  LABS  Recent Labs  11/25/15 1249 11/26/15 0601 11/27/15 0414  HGB 13.6 12.4* 12.3*  HCT 41.3 36.8* 36.5*  WBC 9.1 13.3* 8.0  PLT 204 184 180     Recent Labs  11/25/15 1249 11/26/15 0601  NA  --  140  K  --  3.8  BUN  --  15  CREATININE 0.90 0.84  GLUCOSE  --  145*    No results for input(s): LABPT, INR in the last 72 hours.   Assessment/Plan: 2 Days Post-Op Procedure(s) (LRB): TOTAL HIP ARTHROPLASTY (Left)  D/C today. RTC 7-10 days Start OPPT next week

## 2015-12-19 LAB — TYPE AND SCREEN
ABO/RH(D): B POS
Antibody Identification: NOT DETECTED
Antibody Screen: POSITIVE

## 2016-06-27 ENCOUNTER — Ambulatory Visit (INDEPENDENT_AMBULATORY_CARE_PROVIDER_SITE_OTHER)
Admission: RE | Admit: 2016-06-27 | Discharge: 2016-06-27 | Disposition: A | Discharge status: Home / Self Care | Payer: BLUE CROSS/BLUE SHIELD | Source: Ambulatory Visit | Attending: Family Medicine | Admitting: Family Medicine

## 2016-06-27 ENCOUNTER — Ambulatory Visit (INDEPENDENT_AMBULATORY_CARE_PROVIDER_SITE_OTHER): Payer: BLUE CROSS/BLUE SHIELD | Admitting: Family Medicine

## 2016-06-27 ENCOUNTER — Encounter: Payer: Self-pay | Admitting: Family Medicine

## 2016-06-27 VITALS — BP 122/84 | HR 83 | Temp 98.6°F | Ht 73.0 in | Wt 207.5 lb

## 2016-06-27 DIAGNOSIS — Z96642 Presence of left artificial hip joint: Secondary | ICD-10-CM | POA: Diagnosis not present

## 2016-06-27 DIAGNOSIS — M79652 Pain in left thigh: Secondary | ICD-10-CM

## 2016-06-27 NOTE — Patient Instructions (Signed)
Hip Rehab:  Hip Flexion: Toe up to ceiling, laying on your back. Lift your whole leg, 3 sets. Work up to being able to do #30 with each set.  Hip elevations, Toe and leg turned out to side.  Lift whole leg, 3 sets. Work up to being able to do #30 with each set.  Hip Abductions: Lying on side, straight out to side. 3 sets, work up to being able to do #30 with each set.  At the beginning you may only be able to do a lot less, try to do #10.    Use some warm linament - like icy hot or tiger balm 2-3 times a day on the stomach

## 2016-06-27 NOTE — Progress Notes (Signed)
Dr. Karleen Hampshire T. Nikala Walsworth, MD, CAQ Sports Medicine [facility] [address] [place]  [place], [address] Phone: [phone] Fax: [phone]  06/27/2016  Patient: Michael Chung, MRN: [id number], DOB: [birth year], 55 y.o.  Primary Physician:  Crawford Givens, MD   Chief Complaint  Patient presents with  . Leg Pain    Left Thigh   Subjective:   Michael Chung is a 55 y.o. very pleasant male patient who presents with the following:  Left thigh is painful - worse about Thursday and Friday. Used ice and TENS unit. Tender with walking. Called Dr. Rondel Baton office and they could not see him.   Worked under his house on Wed. And then Thursday afternoon. Then put some ice on it. He is having anterior thigh pain. No numbness or radiculopathy. No trauma.   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    Past Medical History, Surgical History, Social History, Family History, Problem List, Medications, and Allergies have been reviewed and updated if relevant.  Patient Active Problem List   Diagnosis Date Noted  . S/P total hip arthroplasty 11/25/2015  . Smoker 11/19/2015  . Preoperative cardiovascular examination 11/19/2015  . RBBB 11/09/2015  . Osteoarthritis of left hip 11/03/2015  . Coffee ground emesis 02/05/2015  . ED (erectile dysfunction) 02/05/2015  . Advance care planning 02/05/2015  . Unspecified constipation 06/26/2014  . FH: colon cancer 06/26/2014  . Routine general medical examination at a health care facility 07/03/2012  . Dupuytren contracture 07/03/2012    Past Medical History:  Diagnosis Date  . Arthritis   . Coffee ground emesis    2016 after nsaid use  . Dupuytren contracture 06/2013   right long, ring, small fingers  . GERD (gastroesophageal reflux disease)    uses OTC as needed    Past Surgical History:  Procedure Laterality Date  . DUPUYTREN CONTRACTURE RELEASE Right 06/27/2013   Procedure: EXCISION DUPUYTRENS RIGHT LONG AND RING  FINGERS;   Surgeon: Wyn Forster., MD;  Location: [facility];  Service: Orthopedics;  Laterality: Right;  . TOTAL HIP ARTHROPLASTY Left 11/25/2015   Procedure: TOTAL HIP ARTHROPLASTY;  Surgeon: Deeann Saint, MD;  Location: [facility];  Service: Orthopedics;  Laterality: Left;    Social History   Social History  . Marital status: Married    Spouse name: N/A  . Number of children: N/A  . Years of education: N/A   Occupational History  . [employer]    Social History Main Topics  . Smoking status: Current Every Day Smoker    Packs/day: 1.00    Years: 20.00    Types: Cigarettes  . Smokeless tobacco: Never Used  . Alcohol use No  . Drug use: No  . Sexual activity: Not on file   Other Topics Concern  . Not on file   Social History Narrative   Education:  11th grade   Works at [employer]    Married 1987    Family History  Problem Relation Age of Onset  . Hypertension Father   . Diabetes Father   . Colon cancer Father   . Heart Problems Father   . Diabetes Mother   . Hypertension Mother   . Colon cancer Brother   . Colon cancer Brother   . Prostate cancer Neg Hx     Allergies  Allergen Reactions  . Nsaids Other (See Comments)    H/o coffee ground emesis    Medication list reviewed and updated in full  in [facility].  GEN: No fevers, chills. Nontoxic. Primarily MSK c/o today. MSK: Detailed in the HPI GI: tolerating PO intake without difficulty Neuro: No numbness, parasthesias, or tingling associated. Otherwise the pertinent positives of the ROS are noted above.   Objective:   BP 122/84 (BP Location: Right Arm, Patient Position: Sitting, Cuff Size: Large)   Pulse 83   Temp 98.6 F (37 C) (Oral)   Ht 6\' 1"  (1.854 m)   Wt 207 lb 8 oz (94.1 kg)   BMI 27.38 kg/m    GEN: WDWN, NAD, Non-toxic, Alert & Oriented x 3 HEENT: Atraumatic, Normocephalic.  Ears and Nose: No external deformity. EXTR: No clubbing/cyanosis/edema NEURO: Normal gait.  Mild antalgia PSYCH: Normally interactive. Conversant. Not depressed or anxious appearing.  Calm demeanor.   HIP EXAM: SIDE: B ROM: Abduction, Flexion, Internal and External range of motion: moderately restricted on the right compared to the left. Pain with terminal IROM and EROM: none GTB: NT SLR: NEG Knees: No effusion FABER: NT REVERSE FABER: NT, neg Piriformis: NT at direct palpation Str: flexion: 5/5 abduction: 5/5 - there is some pain in the midportion of the rectus femoris. adduction: 5/5 Strength testing non-tender     Radiology: Dg Hip Unilat With Pelvis 2-3 Views Left  Result Date: 06/27/2016 CLINICAL DATA:  Left hip and left thigh pain for the past 4 days without trauma; history of total hip joint replacement in December 2016 EXAM: DG HIP (WITH OR WITHOUT PELVIS) 2-3V LEFT COMPARISON:  Immediate postoperative images from the hip joint replacement procedure in December 2016 FINDINGS: The bony pelvis appears intact. The acetabular component of the prosthesis appears appropriately positioned. The femoral head correction the prosthetic femoral head is appropriately related to the prosthetic acetabular cup. The femoral component of the prosthesis appears normal with the interface with the native bone being normal. IMPRESSION: No acute abnormality of the left hip joint prosthesis nor of the native bone. Electronically Signed   By: David  [place] M.D.   On: 06/27/2016 17:12  Dg Femur Min 2 Views Left  Result Date: 06/27/2016 CLINICAL DATA:  Left hip and thigh pain for the past 4 days without known trauma ; left hip replacement in December 2016 EXAM: LEFT FEMUR 2 VIEWS COMPARISON:  Left hip series of today's date and immediate postoperative images of the left hip of November 25, 2015 FINDINGS: The prosthetic joint components appear appropriately positioned. The interface with the native bone is intact. The native bone itself is unremarkable. The femoral shaft distal to the prosthesis  appears reasonably well mineralized and intact. There is no periosteal reaction. The observed portions of the knee exhibit no acute abnormalities. There calcifications in the popliteal artery and distal superficial femoral artery. IMPRESSION: No acute bony abnormality of the left femur is observed. The left hip joint prosthesis and its femoral component appear normal. Electronically Signed   By: David  [place] M.D.   On: 06/27/2016 17:11    Assessment and Plan:   Left thigh pain - Plan: DG HIP UNILAT WITH PELVIS 2-3 VIEWS LEFT, DG FEMUR MIN 2 VIEWS LEFT  History of left hip replacement - Plan: DG HIP UNILAT WITH PELVIS 2-3 VIEWS LEFT, DG FEMUR MIN 2 VIEWS LEFT  I reassured the patient that his hip replacement looks fine on radiographs.  Isolates to rectus femoris, likely pulled while he was working underneath his house.  History of  GI bleed, avoid NSAIDs in this case.  Recommend massage, use of liniment, and basic  range of motion and strengthening of the thigh and hip musculature.  Follow-up: No Follow-up on file.  Orders Placed This Encounter  Procedures  . DG HIP UNILAT WITH PELVIS 2-3 VIEWS LEFT  . DG FEMUR MIN 2 VIEWS LEFT   Patient Instructions  Hip Rehab:  Hip Flexion: Toe up to ceiling, laying on your back. Lift your whole leg, 3 sets. Work up to being able to do #30 with each set.  Hip elevations, Toe and leg turned out to side.  Lift whole leg, 3 sets. Work up to being able to do #30 with each set.  Hip Abductions: Lying on side, straight out to side. 3 sets, work up to being able to do #30 with each set.  At the beginning you may only be able to do a lot less, try to do #10.    Use some warm linament - like icy hot or tiger balm 2-3 times a day on the stomach    Signed,  Jarissa Sheriff T. Alvino Lechuga, MD   Patient's Medications  New Prescriptions   No medications on file  Previous Medications   ASPIRIN EC 325 MG TABLET    Take 1 tablet (325 mg total) by mouth daily.    CELECOXIB (CELEBREX) 200 MG CAPSULE    Take 1 capsule (200 mg total) by mouth daily.   FERROUS SULFATE 325 (65 FE) MG TABLET    Take 1 tablet (325 mg total) by mouth 2 (two) times daily with a meal.   GABAPENTIN (NEURONTIN) 400 MG CAPSULE    Take 1 capsule (400 mg total) by mouth 3 (three) times daily.   HYDROCODONE-ACETAMINOPHEN (NORCO) 7.5-325 MG TABLET    Take 1 tablet by mouth every 6 (six) hours.   METHOCARBAMOL (ROBAXIN) 500 MG TABLET    Take 1 tablet (500 mg total) by mouth every 6 (six) hours as needed for muscle spasms.  Modified Medications   No medications on file  Discontinued Medications   No medications on file

## 2016-06-27 NOTE — Progress Notes (Signed)
Pre visit review using our clinic review tool, if applicable. No additional management support is needed unless otherwise documented below in the visit note. 

## 2017-03-24 ENCOUNTER — Encounter: Payer: Self-pay | Admitting: Family Medicine

## 2017-03-24 ENCOUNTER — Ambulatory Visit (INDEPENDENT_AMBULATORY_CARE_PROVIDER_SITE_OTHER): Payer: BLUE CROSS/BLUE SHIELD | Admitting: Family Medicine

## 2017-03-24 DIAGNOSIS — M79652 Pain in left thigh: Secondary | ICD-10-CM

## 2017-03-24 NOTE — Progress Notes (Signed)
Pre visit review using our clinic review tool, if applicable. No additional management support is needed unless otherwise documented below in the visit note. 

## 2017-03-24 NOTE — Patient Instructions (Signed)
Schedule a physical in the summer/fall when possible.  Use the hip rehab exercises (straight leg raises, rotated leg raises, and side leg lifts). Start at 5 a piece and gradually work up to 10-15 each.  Take care.  Glad to see you.

## 2017-03-24 NOTE — Progress Notes (Signed)
Patient is cutting back from smoking.  I encouraged him.    Is s/p L hip replacement.  In the meantime, he has had some L quad pain.  Some better today than yesterday.  No posterior pain.  No trauma, no bruising.  No hip pain, no radicular.  No B/B sx.  Knee isn't painful.    He took gabapentin last night, that was the first dose he had taken in a while.    Meds, vitals, and allergies reviewed.   ROS: Per HPI unless specifically indicated in ROS section   nad ncat rrr ctab abd soft Back not ttp Normal L hip rom Left quad slightly tender to palpation without bruising. No palpable muscle belly rupture in the quad. Mild discomfort with resisted knee extension. No pain on ITB testing. Able to bear weight. Not tender to palpation over bony prominences.

## 2017-03-26 DIAGNOSIS — M79659 Pain in unspecified thigh: Secondary | ICD-10-CM | POA: Insufficient documentation

## 2017-03-26 NOTE — Assessment & Plan Note (Signed)
Likely quadriceps irritation, strain. No sign of true muscle rupture. Discussed with patient. This likely is not a hip issue per se, but he likely has some relative gait changes related to his previous hip pathology and replacement so that it is easier for him to irritate his left quad.  Reasonable to rehabilitate his left quad with routine home exercise program. He has done some of these before. Demonstrated and discussed with patient. He understood. He will start with straight leg raises, rotated leg raises, and side leg lifts.  He can start at 5 a piece and gradually work up to 10-15 each. No weights needed. Should resolve. Update me as needed. He agrees.

## 2017-05-04 ENCOUNTER — Other Ambulatory Visit: Payer: Self-pay

## 2017-05-04 MED ORDER — GABAPENTIN 400 MG PO CAPS: 400.0000 mg | ORAL_CAPSULE | Freq: Three times a day (TID) | ORAL | 2 refills | Status: DC

## 2017-05-04 NOTE — Telephone Encounter (Signed)
Needs to await PCP for refill

## 2017-05-04 NOTE — Telephone Encounter (Signed)
Sent. Thanks.   

## 2017-05-04 NOTE — Telephone Encounter (Signed)
Pt left v/m; pt seen 03/24/17 and pt [facility] Dr Deeann SaintHoward Miller last refilled # 60 x 3 on 11/27/15.

## 2017-05-09 ENCOUNTER — Encounter: Payer: Self-pay | Admitting: Family Medicine

## 2017-05-09 ENCOUNTER — Ambulatory Visit (INDEPENDENT_AMBULATORY_CARE_PROVIDER_SITE_OTHER): Payer: BLUE CROSS/BLUE SHIELD | Admitting: Family Medicine

## 2017-05-09 VITALS — BP 140/90 | HR 101 | Temp 98.1°F | Wt 218.2 lb

## 2017-05-09 DIAGNOSIS — M79659 Pain in unspecified thigh: Secondary | ICD-10-CM | POA: Diagnosis not present

## 2017-05-09 DIAGNOSIS — Z0001 Encounter for general adult medical examination with abnormal findings: Secondary | ICD-10-CM

## 2017-05-09 DIAGNOSIS — Z1159 Encounter for screening for other viral diseases: Secondary | ICD-10-CM

## 2017-05-09 DIAGNOSIS — Z125 Encounter for screening for malignant neoplasm of prostate: Secondary | ICD-10-CM

## 2017-05-09 DIAGNOSIS — Z0000 Encounter for general adult medical examination without abnormal findings: Secondary | ICD-10-CM

## 2017-05-09 DIAGNOSIS — Z8249 Family history of ischemic heart disease and other diseases of the circulatory system: Secondary | ICD-10-CM

## 2017-05-09 NOTE — Patient Instructions (Addendum)
Fasting lab appointment when possible.  Scheduled on the way out.  Check your pressure a few times out of clinic when at rest.  Take care.  Glad to see you.

## 2017-05-09 NOTE — Progress Notes (Signed)
CPE- See plan.  Routine anticipatory guidance given to patient.  See health maintenance.  The possibility exists that previously documented standard health maintenance information may have been brought forward from a previous encounter into this note.  If needed, that same information has been updated to reflect the current situation based on today's encounter.    Tetanus 2016 Flu encouraged.   PNA not due, d/w pt. Shingles not due.  Colonoscopy 2015.  Prostate cancer screening and PSA options (with potential risks and benefits of testing vs not testing) were discussed along with recent recs/guidelines.   He opts in.   Living will d/w pt.  Wife designated if patient were incapacitated.   Diet and exercise d/w pt. A lot of exercise at work.   Smoking d/w pt.  He is tapering down.  Encouraged.   Pt opts in for HCV screening.  D/w pt re: routine screening.    He gets jittery with gabapentin use but it helps with his leg/thigh pain.  Unclear if he tolerates it better on a full stomach, d/w pt.  He tried home exercises but was still getting some soreness.  He is some better in the meantime but not folly resolved.    No FCNAVD.  He is tolerating Aspirin w/o complication.  PMH and SH reviewed  Meds, vitals, and allergies reviewed.   ROS: Per HPI.  Unless specifically indicated otherwise in HPI, the patient denies:  General: fever. Eyes: acute vision changes ENT: sore throat Cardiovascular: chest pain Respiratory: SOB GI: vomiting GU: dysuria Musculoskeletal: acute back pain Derm: acute rash Neuro: acute motor dysfunction Psych: worsening mood Endocrine: polydipsia Heme: bleeding Allergy: hayfever  GEN: nad, alert and oriented HEENT: mucous membranes moist NECK: supple w/o LA CV: rrr. PULM: ctab, no inc wob ABD: soft, +bs EXT: no edema SKIN: no acute rash

## 2017-05-10 NOTE — Assessment & Plan Note (Signed)
I still think some of this is likely due to gait changes related to his previous orthopedic issues, accommodation. He is getting some better in the meantime. He uses gabapentin sparingly, only as needed. He is some better in the meantime. Would not change management at this point. He agrees. Update me as needed.

## 2017-05-10 NOTE — Assessment & Plan Note (Signed)
Tetanus 2016 Flu encouraged.   PNA not due, d/w pt. Shingles not due.  Colonoscopy 2015.  Prostate cancer screening and PSA options (with potential risks and benefits of testing vs not testing) were discussed along with recent recs/guidelines.   He opts in.   Living will d/w pt.  Wife designated if patient were incapacitated.   Diet and exercise d/w pt. A lot of exercise at work.   Smoking d/w pt.  He is tapering down.  Encouraged.   Pt opts in for HCV screening.  D/w pt re: routine screening.   Return for routine labs. Discussed with patient. Recheck blood pressure is okay.

## 2017-05-18 ENCOUNTER — Other Ambulatory Visit: Payer: BLUE CROSS/BLUE SHIELD

## 2017-05-19 ENCOUNTER — Other Ambulatory Visit (INDEPENDENT_AMBULATORY_CARE_PROVIDER_SITE_OTHER): Payer: BLUE CROSS/BLUE SHIELD

## 2017-05-19 DIAGNOSIS — Z125 Encounter for screening for malignant neoplasm of prostate: Secondary | ICD-10-CM

## 2017-05-19 DIAGNOSIS — Z8249 Family history of ischemic heart disease and other diseases of the circulatory system: Secondary | ICD-10-CM

## 2017-05-19 DIAGNOSIS — Z1159 Encounter for screening for other viral diseases: Secondary | ICD-10-CM

## 2017-05-19 LAB — COMPREHENSIVE METABOLIC PANEL
ALT: 14 U/L (ref 0–53)
AST: 15 U/L (ref 0–37)
Albumin: 4.1 g/dL (ref 3.5–5.2)
Alkaline Phosphatase: 58 U/L (ref 39–117)
BUN: 20 mg/dL (ref 6–23)
CO2: 26 mEq/L (ref 19–32)
Calcium: 9.5 mg/dL (ref 8.4–10.5)
Chloride: 111 mEq/L (ref 96–112)
Creatinine, Ser: 0.96 mg/dL (ref 0.40–1.50)
GFR: 104.37 mL/min (ref >60.00)
Glucose, Bld: 105 mg/dL — ABNORMAL HIGH (ref 70–99)
Potassium: 4 mEq/L (ref 3.5–5.1)
Sodium: 143 mEq/L (ref 135–145)
Total Bilirubin: 0.4 mg/dL (ref 0.2–1.2)
Total Protein: 6.7 g/dL (ref 6.0–8.3)

## 2017-05-19 LAB — LIPID PANEL
Cholesterol: 188 mg/dL (ref 0–200)
HDL: 32.6 mg/dL — ABNORMAL LOW (ref >39.00)
LDL Cholesterol: 138 mg/dL — ABNORMAL HIGH (ref 0–99)
NonHDL: 155.04
Total CHOL/HDL Ratio: 6
Triglycerides: 86 mg/dL (ref 0.0–149.0)
VLDL: 17.2 mg/dL (ref 0.0–40.0)

## 2017-05-19 LAB — PSA: PSA: 0.22 ng/mL (ref 0.10–4.00)

## 2017-05-20 LAB — HEPATITIS C ANTIBODY: HCV Ab: NEGATIVE

## 2017-06-02 ENCOUNTER — Other Ambulatory Visit: Payer: Self-pay | Admitting: *Deleted

## 2017-06-02 NOTE — Telephone Encounter (Signed)
Requests 90 day supply Electronic refill request. Last office visit:   05/09/17 Last Filled:    90 capsule 2 05/04/2017  90 day supply please.

## 2017-06-04 MED ORDER — GABAPENTIN 400 MG PO CAPS: 400.0000 mg | ORAL_CAPSULE | Freq: Three times a day (TID) | ORAL | 1 refills | Status: DC

## 2017-06-04 NOTE — Telephone Encounter (Signed)
Sent. Thanks.   

## 2017-12-19 ENCOUNTER — Other Ambulatory Visit: Payer: Self-pay | Admitting: Family Medicine

## 2017-12-19 NOTE — Telephone Encounter (Signed)
neurontin last refilled # 270 x 1 on 06/04/17. [facility]09/17[facility]

## 2017-12-19 NOTE — Telephone Encounter (Signed)
Copied from CRM [phone]#[id number]. Topic: Quick Communication - Rx Refill/Question >> Dec 19, 2017  3:42 PM Alexander BergeronBarksdale, Bova B wrote: Medication:  gabapentin (NEURONTIN) 400 MG capsule [[id number]][[id number]]   Has the patient contacted their pharmacy? No. (Agent: If no, request that the patient contact the pharmacy for the refill.)  Preferred Pharm[facility] (with phone number [facility] [place]  Agent: Please be advised that RX refills may take up to 3 business days. We ask that you follow-up with your pharmacy.  Pt was told to contact pharmacy

## 2017-12-20 MED ORDER — GABAPENTIN 400 MG PO CAPS: 400.0000 mg | ORAL_CAPSULE | Freq: Three times a day (TID) | ORAL | 1 refills | Status: DC

## 2017-12-20 NOTE — Telephone Encounter (Signed)
Sent. Thanks.   

## 2018-05-21 ENCOUNTER — Ambulatory Visit (INDEPENDENT_AMBULATORY_CARE_PROVIDER_SITE_OTHER): Payer: BLUE CROSS/BLUE SHIELD | Admitting: Family Medicine

## 2018-05-21 ENCOUNTER — Encounter: Payer: Self-pay | Admitting: Family Medicine

## 2018-05-21 VITALS — BP 142/90 | HR 91 | Temp 98.4°F | Ht 73.0 in | Wt 222.8 lb

## 2018-05-21 DIAGNOSIS — Z8249 Family history of ischemic heart disease and other diseases of the circulatory system: Secondary | ICD-10-CM | POA: Diagnosis not present

## 2018-05-21 DIAGNOSIS — Z125 Encounter for screening for malignant neoplasm of prostate: Secondary | ICD-10-CM

## 2018-05-21 DIAGNOSIS — F17200 Nicotine dependence, unspecified, uncomplicated: Secondary | ICD-10-CM

## 2018-05-21 DIAGNOSIS — M1610 Unilateral primary osteoarthritis, unspecified hip: Secondary | ICD-10-CM

## 2018-05-21 DIAGNOSIS — Z0000 Encounter for general adult medical examination without abnormal findings: Secondary | ICD-10-CM | POA: Diagnosis not present

## 2018-05-21 DIAGNOSIS — Z7189 Other specified counseling: Secondary | ICD-10-CM

## 2018-05-21 MED ORDER — NICOTINE POLACRILEX 2 MG MT GUM: 2.0000 mg | CHEWING_GUM | OROMUCOSAL | 1 refills | Status: DC | PRN

## 2018-05-21 MED ORDER — TRAMADOL HCL 50 MG PO TABS: 50.0000 mg | ORAL_TABLET | Freq: Three times a day (TID) | ORAL | 1 refills | Status: DC | PRN

## 2018-05-21 NOTE — Assessment & Plan Note (Signed)
Discussed cessation.  He may be a good candidate for intermittent nicotine replacement, when he has the urge to smoke.  Discussed.

## 2018-05-21 NOTE — Assessment & Plan Note (Signed)
Living will d/w pt.  Wife designated if patient were incapacitated.   ?

## 2018-05-21 NOTE — Patient Instructions (Addendum)
Go to the lab on the way out.  We'll contact you with your lab report. Try tramadol for the hip pain.  Sedation caution.  Try the gum instead of chantix.  Update me as needed.

## 2018-05-21 NOTE — Assessment & Plan Note (Addendum)
Tetanus 2016 Flu done yearly.   PNA not due, d/w pt. Shingles d/w pt.  Out of stock.   Colonoscopy 2015.  Prostate cancer screening and PSA options (with potential risks and benefits of testing vs not testing) were discussed along with recent recs/guidelines.   He opts in.   Living will d/w pt.  Wife designated if patient were incapacitated.   Diet and exercise d/w pt. A lot of exercise at work.   HCV testing prev done.    See notes on labs.  Reasonable to recheck lipids and sugar given previous values and family history.

## 2018-05-21 NOTE — Progress Notes (Signed)
CPE- See plan.  Routine anticipatory guidance given to patient.  See health maintenance.  The possibility exists that previously documented standard health maintenance information may have been brought forward from a previous encounter into this note.  If needed, that same information has been updated to reflect the current situation based on today's encounter.    Tetanus 2016 Flu done yearly.   PNA not due, d/w pt. Shingles d/w pt.  Out of stock.   Colonoscopy 2015.  Prostate cancer screening and PSA options (with potential risks and benefits of testing vs not testing) were discussed along with recent recs/guidelines.   He opts in.   Living will d/w pt.  Wife designated if patient were incapacitated.   Diet and exercise d/w pt. A lot of exercise at work.   HCV testing prev done.    He is using chantix and that didn't help much.  No ADE but not much help with med.  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  He may benefit from nicotine replacement instead of chantix.   Discussed options and rationale.    Hip pain. Taking gabapentin at baseline.  Not much help, still with sig pain.  Right-sided hip pain.  More pain with weightbearing and with internal rotation.  More pain with range of motion in general.  No left-sided hip pain after previous surgery.  PMH and SH reviewed Meds, vitals, and allergies reviewed.   ROS: Per HPI.  Unless specifically indicated otherwise in HPI, the patient denies:  General: fever. Eyes: acute vision changes ENT: sore throat Cardiovascular: chest pain Respiratory: SOB GI: vomiting GU: dysuria Musculoskeletal: acute back pain Derm: acute rash Neuro: acute motor dysfunction Psych: worsening mood Endocrine: polydipsia Heme: bleeding Allergy: hayfever  GEN: nad, alert and oriented HEENT: mucous membranes moist NECK: supple w/o LA CV: rrr. PULM: ctab, no inc wob ABD: soft, +bs EXT: no edema SKIN: no acute rash R hip with pain on internal rotation and also  hip flexion.  Able to bear weight.  Not tender at the greater trochanteric area.

## 2018-05-21 NOTE — Assessment & Plan Note (Addendum)
Previous imaging discussed with patient.  Images reviewed with patient at office visit.  He has right-sided arthritis.  Likely has worsening pain from that, with consistent exam.  Reasonable to try tramadol.  Avoid NSAIDs.  We may have to refer back to orthopedics if not better with tramadol.  He may end up needing replacement.  We will have to see how he does clinically and we will await orthopedic input, if needed.  Sedation caution given on tramadol.  Initial prescription for tramadol printed but this was voided out and resent electronically.

## 2018-05-22 LAB — LDL CHOLESTEROL, DIRECT: Direct LDL: 138 mg/dL

## 2018-05-22 LAB — COMPREHENSIVE METABOLIC PANEL
ALT: 12 U/L (ref 0–53)
AST: 10 U/L (ref 0–37)
Albumin: 4.2 g/dL (ref 3.5–5.2)
Alkaline Phosphatase: 56 U/L (ref 39–117)
BUN: 23 mg/dL (ref 6–23)
CO2: 30 mEq/L (ref 19–32)
Calcium: 10.1 mg/dL (ref 8.4–10.5)
Chloride: 107 mEq/L (ref 96–112)
Creatinine, Ser: 1.36 mg/dL (ref 0.40–1.50)
GFR: 69.57 mL/min (ref >60.00)
Glucose, Bld: 95 mg/dL (ref 70–99)
Potassium: 4.3 mEq/L (ref 3.5–5.1)
Sodium: 144 mEq/L (ref 135–145)
Total Bilirubin: 0.3 mg/dL (ref 0.2–1.2)
Total Protein: 6.8 g/dL (ref 6.0–8.3)

## 2018-05-22 LAB — LIPID PANEL
Cholesterol: 209 mg/dL — ABNORMAL HIGH (ref 0–200)
HDL: 31.7 mg/dL — ABNORMAL LOW (ref >39.00)
NonHDL: 177
Total CHOL/HDL Ratio: 7
Triglycerides: 261 mg/dL — ABNORMAL HIGH (ref 0.0–149.0)
VLDL: 52.2 mg/dL — ABNORMAL HIGH (ref 0.0–40.0)

## 2018-05-22 LAB — PSA: PSA: 0.19 ng/mL (ref 0.10–4.00)

## 2018-05-28 ENCOUNTER — Other Ambulatory Visit: Payer: Self-pay | Admitting: Family Medicine

## 2018-05-28 DIAGNOSIS — E785 Hyperlipidemia, unspecified: Secondary | ICD-10-CM

## 2018-07-10 ENCOUNTER — Telehealth: Payer: Self-pay | Admitting: Family Medicine

## 2018-07-10 NOTE — Telephone Encounter (Signed)
Copied from CRM [id number]. Topic: Quick Communication - Rx Refill/Question >> Jul 10, 2018  8:59 AM Alexander BergeronBarksdale, Hocevar B wrote: Medication: traMADol (ULTRAM) 50 MG tablet [[id number]][[id number]]  Pt called and wanted to speak w/ the nurse of pcp; pt states the medication above is not helping; the other hip is having issues now and pt is needing help w/ pain until the other hip can be replaced; pt is up for other options w/ medication

## 2018-07-11 MED ORDER — HYDROCODONE-ACETAMINOPHEN 5-325 MG PO TABS: 0.5000 | ORAL_TABLET | Freq: Three times a day (TID) | ORAL | 0 refills | Status: DC | PRN

## 2018-07-11 NOTE — Telephone Encounter (Signed)
Recommend he try 2 tramadol at a time assuming he can tolerate side effects. If no better with 2 let us know to consider stronger pain medication. Will cc PCP

## 2018-07-11 NOTE — Telephone Encounter (Signed)
Patient notified as instructed by telephone and verbalized understanding. Patient stated that he has already tried taking two at a time for 3 days and that did not help. Patient stated that he is now out of the Tramadol.

## 2018-07-11 NOTE — Telephone Encounter (Signed)
He is reliable and has known hip OA with prev unilateral replacement.   When is he seeing ortho?   Let me know.   I sent rx for vicodin to try in the meantime.  Sedation caution.  Update me about relief from med in a few days.  If he is going to need to use med after this rx, will need pain med appointment.   Don't use prior to driving, work, Catering manageretc.  Materials engineerThanks.  [place] database reviewed, appropriate.

## 2018-07-11 NOTE — Addendum Note (Signed)
Addended by: Joaquim NamUNCAN, Trishna Cwik S on: 07/11/2018 03:54 PM   Modules accepted: Orders

## 2018-07-31 ENCOUNTER — Other Ambulatory Visit: Payer: Self-pay | Admitting: Family Medicine

## 2018-07-31 NOTE — Telephone Encounter (Signed)
Unable to refill per protocol.  Gabapentin refill Last Refill:12/20/17 # 270/1 refill Last OV: 05/21/18 PCP: Para Marchuncan Pharmacy: [id number] [place] [place] - [phone] (Phone) [phone] (Fax)

## 2018-07-31 NOTE — Telephone Encounter (Signed)
Copied from CRM [id number]. Topic: Quick Communication - See Telephone Encounter >> Jul 31, 2018  1:19 PM Windy KalataMichael, Tomicka Lover L, NT wrote: CRM for notification. See Telephone encounter for: 07/31/18.  Patient is needing a refill on gabapentin (NEURONTIN) 400 MG capsule.  [id number] [place] [place] - [address] [place] [id number] Phone: [phone] Fax: [phone]

## 2018-07-31 NOTE — Telephone Encounter (Signed)
Name of Medication: Gabapentin 400 mg Name of [facility]  Last Fill or Written Date and Quantity: #270  X 1 on 12/20/17 Last Office Visit and Type: 05/21/18 annual Next Office Visit and Type: none scheduled Last Controlled Substance Agreement Date: none Last WUJ:WJXBDS:none

## 2018-08-01 MED ORDER — HYDROCODONE-ACETAMINOPHEN 5-325 MG PO TABS: 0.5000 | ORAL_TABLET | Freq: Three times a day (TID) | ORAL | 0 refills | Status: DC | PRN

## 2018-08-01 MED ORDER — GABAPENTIN 400 MG PO CAPS: 400.0000 mg | ORAL_CAPSULE | Freq: Three times a day (TID) | ORAL | 1 refills | Status: DC

## 2018-08-01 NOTE — Telephone Encounter (Signed)
Spoke to patient by telephone and was advised that he is doing pretty good and pain level is about a 7 and that is not too bad.  Patient stated that he is taking the Gabapentin and Hydrocodone at night which has helped a lot. Patient stated that he could use a few more Hydrocodone if Dr. Para Marchuncan is okay with that. [facility]. Hydrocodone last refill 07/11/18 #15 Last office visit 05/21/18

## 2018-08-01 NOTE — Telephone Encounter (Signed)
Sent.  Please get update on patient regarding his pain level.  thanks.

## 2018-08-01 NOTE — Telephone Encounter (Signed)
Sent hydrocodone.  If this prescription for hydrocodone does not significantly improve his situation and he has an ongoing need for the medication, then please set up a chronic pain visit so we can talk about options.  Thanks. [place] database approved and appropriate.

## 2018-08-01 NOTE — Telephone Encounter (Signed)
Patient notified as instructed by telephone and verbalized understanding. 

## 2018-09-12 ENCOUNTER — Ambulatory Visit: Payer: BLUE CROSS/BLUE SHIELD | Admitting: Family Medicine

## 2018-11-05 ENCOUNTER — Telehealth: Payer: Self-pay | Admitting: Family Medicine

## 2018-11-05 NOTE — Telephone Encounter (Signed)
Pt called and stated he hasn't had his flu shot.

## 2018-11-05 NOTE — Telephone Encounter (Signed)
Appt for flu shot scheduled on flu clinic.

## 2018-11-08 ENCOUNTER — Ambulatory Visit (INDEPENDENT_AMBULATORY_CARE_PROVIDER_SITE_OTHER): Payer: BLUE CROSS/BLUE SHIELD

## 2018-11-08 DIAGNOSIS — Z23 Encounter for immunization: Secondary | ICD-10-CM | POA: Diagnosis not present

## 2019-01-31 ENCOUNTER — Ambulatory Visit: Payer: BLUE CROSS/BLUE SHIELD | Admitting: Family Medicine

## 2019-01-31 ENCOUNTER — Encounter: Payer: Self-pay | Admitting: Family Medicine

## 2019-01-31 VITALS — BP 160/80 | HR 74 | Temp 98.3°F | Ht 73.0 in | Wt 211.9 lb

## 2019-01-31 DIAGNOSIS — Z125 Encounter for screening for malignant neoplasm of prostate: Secondary | ICD-10-CM | POA: Diagnosis not present

## 2019-01-31 DIAGNOSIS — F17200 Nicotine dependence, unspecified, uncomplicated: Secondary | ICD-10-CM

## 2019-01-31 DIAGNOSIS — Z0000 Encounter for general adult medical examination without abnormal findings: Secondary | ICD-10-CM

## 2019-01-31 DIAGNOSIS — M25559 Pain in unspecified hip: Secondary | ICD-10-CM

## 2019-01-31 DIAGNOSIS — Z7189 Other specified counseling: Secondary | ICD-10-CM

## 2019-01-31 DIAGNOSIS — K219 Gastro-esophageal reflux disease without esophagitis: Secondary | ICD-10-CM

## 2019-01-31 DIAGNOSIS — E785 Hyperlipidemia, unspecified: Secondary | ICD-10-CM

## 2019-01-31 DIAGNOSIS — M1610 Unilateral primary osteoarthritis, unspecified hip: Secondary | ICD-10-CM

## 2019-01-31 MED ORDER — OMEPRAZOLE 20 MG PO CPDR
20.0000 mg | DELAYED_RELEASE_CAPSULE | Freq: Every day | ORAL | 3 refills | Status: DC
Start: 2019-01-31 — End: 2019-12-30

## 2019-01-31 NOTE — Progress Notes (Signed)
CPE- See plan.  Routine anticipatory guidance given to patient.  See health maintenance.  The possibility exists that previously documented standard health maintenance information may have been brought forward from a previous encounter into this note.  If needed, that same information has been updated to reflect the current situation based on today's encounter.    Tetanus 2016 Flu done yearly.   PNA not due, d/w pt. Shingles d/w pt.  Out of stock.   Colonoscopy 2015.  Prostate cancer screening and PSA options(with potential risks and benefits of testing vs not testing) were discussed along with recent recs/guidelines. He opts in.  Living will d/w pt. Wife designated if patient were incapacitated.  Diet and exercise d/w pt. A lot of exercise at work.  HCV testing prev done.   He is cutting back on smoking.  D/w pt.    L ear itching, occasionally noted.  GI hx d/w pt, esp re: starting PPI since he is having GERD sx. No coffee ground emesis.     D/w pt not taking nsaids.  He is trying to put off hip surgery.  No recent hydrocodone use. Referred for ortho input.    PMH and SH reviewed  Meds, vitals, and allergies reviewed.   ROS: Per HPI.  Unless specifically indicated otherwise in HPI, the patient denies:  General: fever. Eyes: acute vision changes ENT: sore throat Cardiovascular: chest pain Respiratory: SOB GI: vomiting GU: dysuria Musculoskeletal: acute back pain Derm: acute rash Neuro: acute motor dysfunction Psych: worsening mood Endocrine: polydipsia Heme: bleeding Allergy: hayfever  GEN: nad, alert and oriented HEENT: mucous membranes moist, ear canals without erythema.  TM within normal limits. (Discussed that he can use a small amount of over-the-counter topical skin cream to help treat the itching in case this is related to dry skin) NECK: supple w/o LA CV: rrr. PULM: ctab, no inc wob ABD: soft, +bs EXT: no edema SKIN: no acute rash

## 2019-01-31 NOTE — Patient Instructions (Addendum)
GI should contact you about a colonoscopy this fall.   Go to the lab on the way out.  We'll contact you with your lab report. Check your BP at home and update me if consistently >140/>90.  Keep working on stopping smoking and try taking prilosec daily for reflux.   Take care.  Glad to see you.

## 2019-02-01 LAB — COMPREHENSIVE METABOLIC PANEL
ALT: 11 U/L (ref 0–53)
AST: 14 U/L (ref 0–37)
Albumin: 4.6 g/dL (ref 3.5–5.2)
Alkaline Phosphatase: 66 U/L (ref 39–117)
BUN: 13 mg/dL (ref 6–23)
CO2: 29 mEq/L (ref 19–32)
Calcium: 9.6 mg/dL (ref 8.4–10.5)
Chloride: 106 mEq/L (ref 96–112)
Creatinine, Ser: 0.88 mg/dL (ref 0.40–1.50)
GFR: 107.9 mL/min (ref >60.00)
Glucose, Bld: 95 mg/dL (ref 70–99)
Potassium: 3.7 mEq/L (ref 3.5–5.1)
Sodium: 142 mEq/L (ref 135–145)
Total Bilirubin: 0.3 mg/dL (ref 0.2–1.2)
Total Protein: 7 g/dL (ref 6.0–8.3)

## 2019-02-01 LAB — LIPID PANEL
Cholesterol: 181 mg/dL (ref 0–200)
HDL: 35.2 mg/dL — ABNORMAL LOW (ref >39.00)
LDL Cholesterol: 127 mg/dL — ABNORMAL HIGH (ref 0–99)
NonHDL: 145.5
Total CHOL/HDL Ratio: 5
Triglycerides: 92 mg/dL (ref 0.0–149.0)
VLDL: 18.4 mg/dL (ref 0.0–40.0)

## 2019-02-01 LAB — PSA: PSA: 0.17 ng/mL (ref 0.10–4.00)

## 2019-02-03 DIAGNOSIS — K219 Gastro-esophageal reflux disease without esophagitis: Secondary | ICD-10-CM | POA: Insufficient documentation

## 2019-02-03 NOTE — Assessment & Plan Note (Signed)
Living will d/w pt.  Wife designated if patient were incapacitated.   ?

## 2019-02-03 NOTE — Assessment & Plan Note (Signed)
Tetanus 2016 Flu done yearly.   PNA not due, d/w pt. Shingles d/w pt.  Out of stock.   Colonoscopy 2015.  Prostate cancer screening and PSA options(with potential risks and benefits of testing vs not testing) were discussed along with recent recs/guidelines. He opts in.  Living will d/w pt. Wife designated if patient were incapacitated.  Diet and exercise d/w pt. A lot of exercise at work.  HCV testing prev done.   He is cutting back on smoking.  D/w pt.

## 2019-02-03 NOTE — Assessment & Plan Note (Signed)
Known issue, regarding hip arthritis D/w pt not taking nsaids.  He is trying to put off hip surgery.  No recent hydrocodone use. Referred for ortho input.

## 2019-02-03 NOTE — Assessment & Plan Note (Signed)
GI hx d/w pt, esp re: starting PPI since he is having GERD sx. No coffee ground emesis.   No alarming symptoms otherwise.  See after visit summary.

## 2019-02-03 NOTE — Assessment & Plan Note (Signed)
Encourage cessation. °

## 2019-02-06 ENCOUNTER — Encounter: Payer: Self-pay | Admitting: *Deleted

## 2019-02-06 ENCOUNTER — Telehealth: Payer: Self-pay | Admitting: Family Medicine

## 2019-02-06 NOTE — Telephone Encounter (Signed)
Patient returned Lugene's call about his lab work. °

## 2019-02-06 NOTE — Telephone Encounter (Signed)
Lab results discussed with Michael Chung.  Letter with results also mailed to patient for his records.

## 2019-05-16 ENCOUNTER — Other Ambulatory Visit: Payer: Self-pay | Admitting: Family Medicine

## 2019-05-16 NOTE — Telephone Encounter (Signed)
Electronic refill request. Gabapentin Last office visit:   01/31/2019 Last Filled:    270 capsule 1 08/01/2018  Please advise.

## 2019-05-19 NOTE — Telephone Encounter (Signed)
Sent. Thanks.   

## 2019-06-25 ENCOUNTER — Telehealth: Payer: Self-pay

## 2019-06-25 NOTE — Telephone Encounter (Signed)
Pt said he has been taking the gabapentin 400 mg tid and lately not helping as much. Pt plans hip replacement on rt hip in about 1 month; pt tried one of his wifes pills for pain in rt hip area; cyclobenzaprine 10 mg taking one tab daily. The cyclobenzaprine really helped the hip pain. Pt does not want to get rid of gabapentin but request [facility]obenzaprine to [facility].Please advise. Pt request cb after Dr Damita Dunnings reviews.

## 2019-06-26 MED ORDER — CYCLOBENZAPRINE HCL 10 MG PO TABS: 10.0000 mg | ORAL_TABLET | Freq: Every day | ORAL | 3 refills | Status: DC | PRN

## 2019-06-26 NOTE — Telephone Encounter (Signed)
Called cell phone number but not able to leave a message. Spoke with patient's wife, ok per DPR on file and advised of everything.

## 2019-06-26 NOTE — Telephone Encounter (Signed)
Reasonable to use.  Sedation caution.  Prescription sent.  Thanks.

## 2019-09-02 ENCOUNTER — Ambulatory Visit (INDEPENDENT_AMBULATORY_CARE_PROVIDER_SITE_OTHER): Payer: BC Managed Care – PPO | Admitting: Family Medicine

## 2019-09-02 ENCOUNTER — Other Ambulatory Visit: Payer: Self-pay

## 2019-09-02 ENCOUNTER — Encounter: Payer: Self-pay | Admitting: Family Medicine

## 2019-09-02 VITALS — BP 150/90 | HR 82 | Temp 97.6°F | Ht 73.0 in | Wt 212.0 lb

## 2019-09-02 DIAGNOSIS — Z23 Encounter for immunization: Secondary | ICD-10-CM | POA: Diagnosis not present

## 2019-09-02 DIAGNOSIS — Z01818 Encounter for other preprocedural examination: Secondary | ICD-10-CM | POA: Diagnosis not present

## 2019-09-02 DIAGNOSIS — M1610 Unilateral primary osteoarthritis, unspecified hip: Secondary | ICD-10-CM | POA: Diagnosis not present

## 2019-09-02 NOTE — Progress Notes (Signed)
We talked about smoking cessation.  He wanted help quitting. ~1/2 PPD.  Discussed options.    Hip pain.  D/w pt about routine pre op considerations.   No CP, SOB, BLE edema.  No MI/CHF/CVA. He has trouble walking from R hip pain, not from cardiopulmonary issue.  He has been able to tolerate anesthesia in the past.  Flu shot done today.   Recheck BP 150/90.  Unclear how much this is influenced by pain.  PMH and SH reviewed  ROS: Per HPI unless specifically indicated in ROS section   Meds, vitals, and allergies reviewed.   GEN: nad, alert and oriented HEENT: ncat NECK: supple w/o LA CV: rrr.  no murmur PULM: ctab, no inc wob ABD: soft, +bs EXT: no edema SKIN: no acute rash  EKG without acute changes.  Compared to previous and no significant change.  Discussed with patient at office visit.

## 2019-09-02 NOTE — Patient Instructions (Addendum)
Try generic flavored nicorette gum and see if that helps.   Thanks for getting a flu shot.  Check your BP a few times out of clinic and update me if persistently >140/>90. I'll update ortho that you are appropriately low risk for surgery.   Take care.  Glad to see you.

## 2019-09-05 NOTE — Assessment & Plan Note (Signed)
  Flu shot done today. He can try Nicorette gum for nicotine replacement/cessation.  Discussed use. He can check blood pressure out of clinic and update me if persistently elevated. It does appear that he is appropriately low risk for surgery.  We talked about the fact that I cannot "clear" him for surgery but it does appear that the benefit of surgery would significantly outweigh the risks at this point. >25 minutes spent in face to face time with patient, >50% spent in counselling or coordination of care.

## 2019-10-27 ENCOUNTER — Other Ambulatory Visit: Payer: Self-pay | Admitting: Family Medicine

## 2019-10-28 NOTE — Telephone Encounter (Signed)
Electronic refill request. Cyclobenzaprine Last office visit:   09/02/2019 Last Filled:   30 tablet 3 06/26/2019  Please advise.

## 2019-10-29 NOTE — Telephone Encounter (Signed)
Sent. Thanks.   

## 2019-11-19 ENCOUNTER — Other Ambulatory Visit: Payer: Self-pay | Admitting: Family Medicine

## 2019-11-19 NOTE — Telephone Encounter (Signed)
Sent. Thanks.   

## 2019-11-19 NOTE — Telephone Encounter (Signed)
Electronic refill request. Gabapentin Last office visit:   09/02/2019 Last Filled:    270 capsule 1 05/19/2019  Please advise.

## 2019-12-19 ENCOUNTER — Ambulatory Visit: Payer: BC Managed Care – PPO | Admitting: Family Medicine

## 2019-12-19 ENCOUNTER — Ambulatory Visit (INDEPENDENT_AMBULATORY_CARE_PROVIDER_SITE_OTHER)
Admission: RE | Admit: 2019-12-19 | Discharge: 2019-12-19 | Disposition: A | Discharge status: Home / Self Care | Payer: BC Managed Care – PPO | Source: Ambulatory Visit | Attending: Family Medicine | Admitting: Family Medicine

## 2019-12-19 ENCOUNTER — Encounter: Payer: Self-pay | Admitting: Family Medicine

## 2019-12-19 ENCOUNTER — Telehealth: Payer: Self-pay | Admitting: Family Medicine

## 2019-12-19 ENCOUNTER — Other Ambulatory Visit: Payer: Self-pay

## 2019-12-19 VITALS — BP 150/90 | HR 58 | Temp 97.1°F | Ht 73.0 in | Wt 224.1 lb

## 2019-12-19 DIAGNOSIS — S99921A Unspecified injury of right foot, initial encounter: Secondary | ICD-10-CM

## 2019-12-19 DIAGNOSIS — R0683 Snoring: Secondary | ICD-10-CM

## 2019-12-19 MED ORDER — DOXYCYCLINE HYCLATE 100 MG PO TABS: 100.0000 mg | ORAL_TABLET | Freq: Two times a day (BID) | ORAL | 0 refills | Status: DC

## 2019-12-19 NOTE — Telephone Encounter (Signed)
Noted. Thanks. Will see at OV.  

## 2019-12-19 NOTE — Telephone Encounter (Signed)
Tetanus is in date, less then 5 years.  That isn't the main issue.  If this was anything other than a superficial injury, then he needs to get checked about the injury itself, regardless of tetanus being up to date.  Thanks.

## 2019-12-19 NOTE — Progress Notes (Signed)
This visit occurred during the SARS-CoV-2 public health emergency.  Safety protocols were in place, including screening questions prior to the visit, additional usage of staff PPE, and extensive cleaning of exam room while observing appropriate contact time as indicated for disinfecting solutions.  R foot injury, stepped on a nail yesterday.  Walking in the woods, nail was covered in leaves.  He was able to walk off the nail.  Tender locally.  Can still walk on it.  No FCNAVD. It was an old nail.  He cleaned the area locally.  He has noted some snoring.  He isn't waking gasping for air.  Wife noted snoring in the last few weeks.  No AM HA.  No AM fatigue.  Not nodding off in the day.  15" neck.   He has been working long hours recently.  We talked about this.  It may be that he is having symptoms related to working long hours.  If his snoring continues then he will let me know.  Meds, vitals, and allergies reviewed.   ROS: Per HPI unless specifically indicated in ROS section   nad ncat Right foot with normal inspection except for small healing epithelial defect that is clean and not bleeding on the plantar side of the foot near the distal 1st metatarsal.  The area is not open or ulcerated.  No other foot lesions noted.  Normal sensation.  Skin well perfused.  Normal dorsalis pedis pulse.  Able to bear weight.

## 2019-12-19 NOTE — Telephone Encounter (Signed)
Dr Para March asked me to reach out to pt; pt said it was an old nail that pt stepped on and it do go deep into foot; pt wonders if it hit the bone. Big toe swollen also. No fever, area is sore and pt not sure if any drainage. Pt has no covid symptoms, no travel and no known exposure to + covid. Pt scheduled in office appt today at 3 PM.

## 2019-12-19 NOTE — Telephone Encounter (Signed)
Pt called and left a voicemail regarding his vaccine history.  Patient stepped on a nail 12/18/2019 and is wanting to know if he needs to get his Tetanus vaccine updated. He has been vaccinated in the last 10 years - he was not sure if he needed to get a booster since being injured.    Immunization History  Administered Date(s) Administered  . Influenza,inj,Quad PF,6+ Mos 11/02/2015, 11/08/2018, 09/02/2019  . Influenza-Unspecified 09/28/2017  . Tdap 02/04/2015   Please advise, thanks.

## 2019-12-19 NOTE — Patient Instructions (Addendum)
[email or web address]  Keep the area clean, update me as needed (especially if you have a fever, drainage, more pain, spreading redness).   Update me if the snoring isn't better and if your BP is usually >140/>90.  Take care.  Glad to see you.

## 2019-12-22 DIAGNOSIS — S99921A Unspecified injury of right foot, initial encounter: Secondary | ICD-10-CM | POA: Insufficient documentation

## 2019-12-22 DIAGNOSIS — R0683 Snoring: Secondary | ICD-10-CM | POA: Insufficient documentation

## 2019-12-22 NOTE — Assessment & Plan Note (Signed)
Overall benign exam.  X-ray negative.  Tetanus is still up-to-date.  Would start doxycycline in the meantime given the nature of the injury.  Routine cautions given to patient.  He has no purulent drainage.  No spreading erythema.  Would not be prudent to try to open the site at this point.  Discussed with patient.  He agrees.  He will update me as needed.

## 2019-12-22 NOTE — Assessment & Plan Note (Signed)
He has noted some snoring.  He isn't waking gasping for air.  Wife noted snoring in the last few weeks.  No AM HA.  No AM fatigue.  Not nodding off in the day.  15" neck.   He has been working long hours recently.  We talked about this.  It may be that he is having symptoms related to working long hours.  If his snoring continues then he will let me know.

## 2019-12-28 ENCOUNTER — Other Ambulatory Visit: Payer: Self-pay | Admitting: Family Medicine

## 2020-01-12 ENCOUNTER — Other Ambulatory Visit: Payer: Self-pay

## 2020-01-12 DIAGNOSIS — Z20822 Contact with and (suspected) exposure to covid-19: Secondary | ICD-10-CM

## 2020-01-13 LAB — NOVEL CORONAVIRUS, NAA: SARS-CoV-2, NAA: NOT DETECTED

## 2020-02-28 ENCOUNTER — Ambulatory Visit: Payer: BC Managed Care – PPO | Attending: Internal Medicine

## 2020-02-28 DIAGNOSIS — Z23 Encounter for immunization: Secondary | ICD-10-CM

## 2020-02-28 NOTE — Progress Notes (Signed)
   Covid-19 Vaccination Clinic  Name:  Michael Chung    MRN: [id number] DOB: February 20, 1961  02/28/2020  Michael Chung was observed post Covid-19 immunization for 15 minutes without incident. He was provided with Vaccine Information Sheet and instruction to access the V-Safe system.   Michael Chung was instructed to call 911 with any severe reactions post vaccine: Marland Kitchen Difficulty breathing  . Swelling of face and throat  . A fast heartbeat  . A bad rash all over body  . Dizziness and weakness   Immunizations Administered    Name Date Dose VIS Date Route   Pfizer COVID-19 Vaccine 02/28/2020  4:09 PM 0.3 mL 11/15/2019 Intramuscular   Manufacturer: [employer], [employer]   Lot: [id number]   NDC: [address]-4

## 2020-03-20 ENCOUNTER — Ambulatory Visit: Payer: BC Managed Care – PPO | Attending: Internal Medicine

## 2020-03-20 DIAGNOSIS — Z23 Encounter for immunization: Secondary | ICD-10-CM

## 2020-03-20 NOTE — Progress Notes (Signed)
   Covid-19 Vaccination Clinic  Name:  LUISFERNANDO BRIGHTWELL    MRN: [id number] DOB: 1961-10-12  03/20/2020  Mr. Gassett was observed post Covid-19 immunization for 15 minutes without incident. He was provided with Vaccine Information Sheet and instruction to access the V-Safe system.   Mr. Tomich was instructed to call 911 with any severe reactions post vaccine: Marland Kitchen Difficulty breathing  . Swelling of face and throat  . A fast heartbeat  . A bad rash all over body  . Dizziness and weakness   Immunizations Administered    Name Date Dose VIS Date Route   Pfizer COVID-19 Vaccine 03/20/2020  1:54 PM 0.3 mL 11/15/2019 Intramuscular   Manufacturer: [employer], [employer]   Lot: [id number]   NDC: [address]-0

## 2020-03-25 ENCOUNTER — Other Ambulatory Visit: Payer: Self-pay | Admitting: Family Medicine

## 2020-03-25 NOTE — Telephone Encounter (Signed)
Please call patient and scheduled annual physical.  Send back to CMA after appointment is scheduled for refill.

## 2020-03-25 NOTE — Telephone Encounter (Signed)
Called and got patient scheduled for an office visit concerning hip in May. He will schedule his CPE and labs once he comes in for his appointment.

## 2020-04-10 ENCOUNTER — Ambulatory Visit: Payer: 59 | Admitting: Family Medicine

## 2020-04-10 ENCOUNTER — Ambulatory Visit (INDEPENDENT_AMBULATORY_CARE_PROVIDER_SITE_OTHER)
Admission: RE | Admit: 2020-04-10 | Discharge: 2020-04-10 | Disposition: A | Discharge status: Home / Self Care | Payer: 59 | Source: Ambulatory Visit | Attending: Family Medicine | Admitting: Family Medicine

## 2020-04-10 ENCOUNTER — Other Ambulatory Visit: Payer: Self-pay | Admitting: Family Medicine

## 2020-04-10 ENCOUNTER — Ambulatory Visit: Payer: BC Managed Care – PPO | Admitting: Family Medicine

## 2020-04-10 ENCOUNTER — Other Ambulatory Visit: Payer: Self-pay

## 2020-04-10 ENCOUNTER — Encounter: Payer: Self-pay | Admitting: Family Medicine

## 2020-04-10 VITALS — BP 140/86 | HR 77 | Temp 97.9°F | Ht 73.0 in | Wt 219.2 lb

## 2020-04-10 DIAGNOSIS — M79672 Pain in left foot: Secondary | ICD-10-CM | POA: Diagnosis not present

## 2020-04-10 DIAGNOSIS — M25552 Pain in left hip: Secondary | ICD-10-CM | POA: Diagnosis not present

## 2020-04-10 DIAGNOSIS — M1610 Unilateral primary osteoarthritis, unspecified hip: Secondary | ICD-10-CM

## 2020-04-10 NOTE — Progress Notes (Signed)
This visit was conducted in person.  BP 140/86 (BP Location: Left Arm, Patient Position: Sitting, Cuff Size: Large)   Pulse 77   Temp 97.9 F (36.6 C) (Temporal)   Ht 6\' 1"  (1.854 m)   Wt 219 lb 4 oz (99.5 kg)   SpO2 97%   BMI 28.93 kg/m    CC: L hip pain, L foot pain Subjective:    Patient ID: , male    DOB: Oct 08, 1961, 59 y.o.   MRN: [id number]  HPI: Michael Chung is a 59 y.o. male presenting on 04/10/2020 for Hip Pain (C/o right hip pain.  Started about 2 mos ago. ) and Foot Pain (C/o left foot pain on the heel.  Started about 1 mo ago. )   2 mo h/o lateral L hip pain as well as 1 mo h/o L heel pain. He works maintenance - very active. Denies inciting trauma/injury or falls. No foot surgeries in the past. Notes cramping pain to L calf at night time.  Managing with flexeril muscle relaxant and gabapentin with benefit. No knee pain.   No trouble on the right side of body.   S/p bilateral hip replacements (2016 L, 2020 R)     Relevant past medical, surgical, family and social history reviewed and updated as indicated. Interim medical history since our last visit reviewed. Allergies and medications reviewed and updated. Outpatient Medications Prior to Visit  Medication Sig Dispense Refill  . cyclobenzaprine (FLEXERIL) 10 MG tablet TAKE 1 TABLET (10 MG TOTAL) BY MOUTH DAILY AS NEEDED FOR MUSCLE SPASMS (SEDATION CAUTION.). 30 tablet 3  . doxycycline (VIBRA-TABS) 100 MG tablet Take 1 tablet (100 mg total) by mouth 2 (two) times daily. 14 tablet 0  . gabapentin (NEURONTIN) 400 MG capsule TAKE 1 CAPSULE (400 MG TOTAL) BY MOUTH 3 (THREE) TIMES DAILY. 270 capsule 1  . omeprazole (PRILOSEC) 20 MG capsule TAKE 1 CAPSULE BY MOUTH EVERY DAY 90 capsule 0   No facility-administered medications prior to visit.     Per HPI unless specifically indicated in ROS section below Review of Systems Objective:  BP 140/86 (BP Location: Left Arm, Patient Position: Sitting, Cuff Size:  Large)   Pulse 77   Temp 97.9 F (36.6 C) (Temporal)   Ht 6\' 1"  (1.854 m)   Wt 219 lb 4 oz (99.5 kg)   SpO2 97%   BMI 28.93 kg/m   Wt Readings from Last 3 Encounters:  04/10/20 219 lb 4 oz (99.5 kg)  12/19/19 224 lb 1.6 oz (101.7 kg)  09/02/19 212 lb (96.2 kg)      Physical Exam Vitals and nursing note reviewed.  Constitutional:      Appearance: Normal appearance. He is not ill-appearing.  Musculoskeletal:        General: No swelling or tenderness. Normal range of motion.     Right lower leg: No edema.     Left lower leg: No edema.     Comments:  No pain with int/ext rotation at hip. Neg FABER. No pain at SIJ, GTB or sciatic notch bilaterally. Pain to L hip seems to localize inferiorly and anteriorly to R hip bursa.  Foot exam: No ligament laxity of bilateral ankles or achilles pain, no pain at base of 5th MTs bilaterally.  Reproducible pain to palpation at R heel, with tender swelling noted, pain with calcaneal squeeze test on R  Skin:    General: Skin is warm and dry.     Findings: No rash.  Neurological:     Mental Status: He is alert.       DG Foot Complete Left CLINICAL DATA:  Left heel pain  EXAM: LEFT FOOT - COMPLETE 3+ VIEW  COMPARISON:  None.  FINDINGS: No fracture or malalignment.  Tiny plantar calcaneal spur.  IMPRESSION: No acute osseous abnormality.  Tiny plantar calcaneal spur  Electronically Signed   By: Donavan Foil M.D.   On: 04/10/2020 23:23   Assessment & Plan:  This visit occurred during the SARS-CoV-2 public health emergency.  Safety protocols were in place, including screening questions prior to the visit, additional usage of staff PPE, and extensive cleaning of exam room while observing appropriate contact time as indicated for disinfecting solutions.   Problem List Items Addressed This Visit    Pain of left heel    Anticipate L heel plantar fasciitis, r/o bone spur vs calcaneal injury.  Given positive calcaneal squeeze test, will  check films r/o calc fracture and eval for bone spur. rec conservative treatment for fasciitis with frozen water bottle massages, gentle stretching, heel lift gel insert. Update if not improving with treatment.       Relevant Orders   DG Foot Complete Left (Completed)   Lateral pain of left hip - Primary    Unclear cause. Not quite consistent with hip bursitis. ?IT band syndrome. Will provide hip bursitis exercises to start. Update if ongoing, consider imaging in h/o hip replacement.       Hip arthritis       No orders of the defined types were placed in this encounter.  Orders Placed This Encounter  Procedures  . DG Foot Complete Left    Standing Status:   Future    Number of Occurrences:   1    Standing Expiration Date:   06/10/2021    Order Specific Question:   Reason for Exam (SYMPTOM  OR DIAGNOSIS REQUIRED)    Answer:   L heel pain ?spur    Order Specific Question:   Preferred imaging location?    Answer:   [facility]    Order Specific Question:   Radiology Contrast Protocol - do NOT remove file path    Answer:   [email or web address]   Patient Instructions  I'm not sure what's causing hip pain but try stretching exercises provided today  I think heel pain may be coming from plantar fasciitis or possible heel bone spur - check xray today. Treat with gentle stretching, frozen water bottle massage, and try heel lift gel inserts to shoe. Let us know if not better with this.     Follow up plan: Return if symptoms worsen or fail to improve.  Ria Bush, MD

## 2020-04-10 NOTE — Patient Instructions (Signed)
I'm not sure what's causing hip pain but try stretching exercises provided today  I think heel pain may be coming from plantar fasciitis or possible heel bone spur - check xray today. Treat with gentle stretching, frozen water bottle massage, and try heel lift gel inserts to shoe. Let us know if not better with this.

## 2020-04-11 DIAGNOSIS — M25559 Pain in unspecified hip: Secondary | ICD-10-CM | POA: Insufficient documentation

## 2020-04-11 DIAGNOSIS — M25552 Pain in left hip: Secondary | ICD-10-CM | POA: Insufficient documentation

## 2020-04-11 DIAGNOSIS — M79672 Pain in left foot: Secondary | ICD-10-CM | POA: Insufficient documentation

## 2020-04-11 NOTE — Assessment & Plan Note (Signed)
Anticipate L heel plantar fasciitis, r/o bone spur vs calcaneal injury.  Given positive calcaneal squeeze test, will check films r/o calc fracture and eval for bone spur. rec conservative treatment for fasciitis with frozen water bottle massages, gentle stretching, heel lift gel insert. Update if not improving with treatment.

## 2020-04-11 NOTE — Assessment & Plan Note (Signed)
Unclear cause. Not quite consistent with hip bursitis. ?IT band syndrome. Will provide hip bursitis exercises to start. Update if ongoing, consider imaging in h/o hip replacement.

## 2020-04-13 NOTE — Telephone Encounter (Signed)
Electronic refill request. Cyclobenzaprine Last office visit:   04/10/2020 Acute, Dr. Reece Agar Last Filled:     30 tablet 3 10/29/2019   Electronic refill request. Gabapentin Last office visit:   04/10/2020 Acute, Dr. Reece Agar Last Filled:    270 capsule 1 11/19/2019   Please advise.

## 2020-04-14 NOTE — Telephone Encounter (Signed)
Patient says he is doing well.

## 2020-04-14 NOTE — Telephone Encounter (Signed)
Sent. Thanks.  Please get update on patient's situation/pain level.

## 2020-04-15 NOTE — Telephone Encounter (Signed)
Noted. Thanks.

## 2020-06-02 ENCOUNTER — Telehealth: Payer: Self-pay

## 2020-06-02 NOTE — Telephone Encounter (Signed)
Pt said the gabapentin 400 mg taking tid is not helping pain in hip. Pt wants to stop the gabapentin and get rx sent to [facility] for Ibuprofen 800 mg. Pt said he would rather take one pill than 4-5 of the OTC ibuprofen 200 mg pills. Pt last seen 04/10/20. Pt said to tell Dr Para March "slow his roll".

## 2020-06-03 MED ORDER — IBUPROFEN 800 MG PO TABS: 800.0000 mg | ORAL_TABLET | Freq: Three times a day (TID) | ORAL | 1 refills | Status: DC | PRN

## 2020-06-03 NOTE — Telephone Encounter (Signed)
There are several issues to consider here.  I thought that he had a history of coffee ground emesis when he was taking very high doses of NSAIDs in the past.  If he has been able to tolerate NSAIDs in the meantime then I think it would be okay to restart prescription strength ibuprofen, assuming he was not having any GI upset, assuming he did not take excessive amounts of ibuprofen or other NSAIDs, and assuming he was still taking omeprazole.  Please make sure he still taking omeprazole.  I sent the prescription for ibuprofen.  Make sure to take it with food and do not take it with an empty stomach.  Drink plenty of water when taking the medication.  Please have him update me if he does not tolerate this or if he is not feeling better.  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

## 2020-06-04 NOTE — Telephone Encounter (Signed)
Patient advised and says he seems to be able to tolerate the IBP fine but will let us know if that is not the case.

## 2020-07-25 ENCOUNTER — Other Ambulatory Visit: Payer: Self-pay | Admitting: Family Medicine

## 2020-07-27 NOTE — Telephone Encounter (Signed)
Electronic refill request. IBP 800 mg Last office visit:   04/10/2020 Acute Dr. Reece Agar Last Filled:     90 tablet 1 06/03/2020

## 2020-07-27 NOTE — Telephone Encounter (Signed)
Noted. Thanks.

## 2020-07-27 NOTE — Telephone Encounter (Signed)
Sent but please verify still taking omeprazole and that he isn't having GI sx from ibuprofen use.  due for CPE when possible.  Thanks.

## 2020-07-27 NOTE — Telephone Encounter (Signed)
Patient is still taking Omeprazole but not daily, just prn.  Patient does not feel that the IBP is making the GI sx worse.

## 2020-08-07 ENCOUNTER — Other Ambulatory Visit: Payer: Self-pay

## 2020-08-07 ENCOUNTER — Encounter: Payer: Self-pay | Admitting: Family Medicine

## 2020-08-07 ENCOUNTER — Ambulatory Visit (INDEPENDENT_AMBULATORY_CARE_PROVIDER_SITE_OTHER): Payer: 59 | Admitting: Family Medicine

## 2020-08-07 VITALS — BP 162/100 | HR 83 | Temp 97.7°F | Ht 73.0 in | Wt 218.6 lb

## 2020-08-07 DIAGNOSIS — N529 Male erectile dysfunction, unspecified: Secondary | ICD-10-CM | POA: Diagnosis not present

## 2020-08-07 DIAGNOSIS — M1610 Unilateral primary osteoarthritis, unspecified hip: Secondary | ICD-10-CM | POA: Diagnosis not present

## 2020-08-07 DIAGNOSIS — R030 Elevated blood-pressure reading, without diagnosis of hypertension: Secondary | ICD-10-CM

## 2020-08-07 MED ORDER — SILDENAFIL CITRATE 20 MG PO TABS: 20.0000 mg | ORAL_TABLET | Freq: Every day | ORAL | 12 refills | Status: DC | PRN

## 2020-08-07 NOTE — Progress Notes (Signed)
This visit occurred during the SARS-CoV-2 public health emergency.  Safety protocols were in place, including screening questions prior to the visit, additional usage of staff PPE, and extensive cleaning of exam room while observing appropriate contact time as indicated for disinfecting solutions.  His sister died recently, over labor day weekend.  She was looking after his father's estate.  Now he is taking care of that and grieving the loss concurrently.  Condolences offered.  Stressors d/w pt.    He is still working at baseline.    Ibuprofen helps joint pain.  D/w pt about taking PPI with nsaids.  No bloody or black stools.  No abd pain.  No vomiting.  No abd pain.  Recheck creatinine pending given NSAID use.  He had his covid vaccine, d/w pt.    ED noted.  No prev meds.  Discussed options.  Blood pressure elevation noted.  I want him to check his blood pressure at home and then update me.  He agrees.  Meds, vitals, and allergies reviewed.   ROS: Per HPI unless specifically indicated in ROS section   GEN: nad, alert and oriented HEENT: ncat NECK: supple w/o LA CV: rrr.  PULM: ctab, no inc wob ABD: soft, +bs EXT: no edema SKIN: Well-perfused.

## 2020-08-07 NOTE — Patient Instructions (Signed)
Go to the lab on the way out.   If you have mychart we'll likely use that to update you.     Take prilosec in the meantime.   Use sildenafil if needed but please check your BP at home and update me next week.   Take care.  Glad to see you.

## 2020-08-08 LAB — BASIC METABOLIC PANEL
BUN: 12 mg/dL (ref 7–25)
CO2: 26 mmol/L (ref 20–32)
Calcium: 9.4 mg/dL (ref 8.6–10.3)
Chloride: 105 mmol/L (ref 98–110)
Creat: 0.92 mg/dL (ref 0.70–1.33)
Glucose, Bld: 102 mg/dL — ABNORMAL HIGH (ref 65–99)
Potassium: 3.4 mmol/L — ABNORMAL LOW (ref 3.5–5.3)
Sodium: 139 mmol/L (ref 135–146)

## 2020-08-10 DIAGNOSIS — R030 Elevated blood-pressure reading, without diagnosis of hypertension: Secondary | ICD-10-CM | POA: Insufficient documentation

## 2020-08-10 NOTE — Assessment & Plan Note (Signed)
Blood pressure elevation noted.  I want him to check his blood pressure at home and then update me.  He agrees.

## 2020-08-10 NOTE — Assessment & Plan Note (Signed)
With improvement with ibuprofen.  GI cautions discussed with patient.  He can start PPI.

## 2020-08-10 NOTE — Assessment & Plan Note (Signed)
He can try sildenafil with routine cautions.  No nitroglycerin use.  He will update me as needed.  He agrees.

## 2020-08-19 DIAGNOSIS — M1611 Unilateral primary osteoarthritis, right hip: Secondary | ICD-10-CM | POA: Diagnosis not present

## 2020-09-16 ENCOUNTER — Telehealth: Payer: Self-pay | Admitting: *Deleted

## 2020-09-16 NOTE — Telephone Encounter (Signed)
Patient left a voicemail stating that he is not sure if he had a flu shot last time he was in the office. Patient requested a call back with the date that he had his flu shot. Tried to call patient back and unable to leave a message because no voicemail was available. Will have to try and call patient back later.

## 2020-09-19 ENCOUNTER — Other Ambulatory Visit: Payer: Self-pay | Admitting: Family Medicine

## 2020-09-21 NOTE — Telephone Encounter (Signed)
Last OV 08/07/20 Last fill 07/27/20  #90/1

## 2020-09-23 NOTE — Telephone Encounter (Signed)
Tried to call patient again. No voicemail was available to leave a message. Will wait for patient to call back.

## 2020-10-09 ENCOUNTER — Other Ambulatory Visit: Payer: Self-pay | Admitting: Family Medicine

## 2020-10-10 NOTE — Telephone Encounter (Signed)
Sent. Thanks.   

## 2020-11-20 ENCOUNTER — Other Ambulatory Visit: Payer: Self-pay | Admitting: Family Medicine

## 2021-01-19 ENCOUNTER — Other Ambulatory Visit: Payer: Self-pay | Admitting: Family Medicine

## 2021-01-28 ENCOUNTER — Telehealth: Payer: Self-pay | Admitting: Family Medicine

## 2021-01-28 NOTE — Telephone Encounter (Signed)
Patient would like refill on ibuprofen 800 mg tabs; okay to refill?

## 2021-01-28 NOTE — Telephone Encounter (Signed)
Pt called in wanted to know about getting a refill on the ibuprofen

## 2021-01-29 MED ORDER — IBUPROFEN 800 MG PO TABS: ORAL_TABLET | ORAL | 1 refills | Status: DC

## 2021-01-29 NOTE — Telephone Encounter (Signed)
Sent. Thanks.   

## 2021-03-08 ENCOUNTER — Ambulatory Visit (INDEPENDENT_AMBULATORY_CARE_PROVIDER_SITE_OTHER): Payer: 59 | Admitting: Family Medicine

## 2021-03-08 ENCOUNTER — Other Ambulatory Visit: Payer: Self-pay

## 2021-03-08 ENCOUNTER — Encounter: Payer: Self-pay | Admitting: Family Medicine

## 2021-03-08 VITALS — BP 180/100 | HR 84 | Temp 97.3°F | Ht 73.0 in | Wt 222.0 lb

## 2021-03-08 DIAGNOSIS — I10 Essential (primary) hypertension: Secondary | ICD-10-CM

## 2021-03-08 DIAGNOSIS — R030 Elevated blood-pressure reading, without diagnosis of hypertension: Secondary | ICD-10-CM

## 2021-03-08 DIAGNOSIS — N529 Male erectile dysfunction, unspecified: Secondary | ICD-10-CM

## 2021-03-08 DIAGNOSIS — Z125 Encounter for screening for malignant neoplasm of prostate: Secondary | ICD-10-CM | POA: Diagnosis not present

## 2021-03-08 DIAGNOSIS — Z7189 Other specified counseling: Secondary | ICD-10-CM

## 2021-03-08 DIAGNOSIS — M1610 Unilateral primary osteoarthritis, unspecified hip: Secondary | ICD-10-CM

## 2021-03-08 DIAGNOSIS — Z0000 Encounter for general adult medical examination without abnormal findings: Secondary | ICD-10-CM | POA: Diagnosis not present

## 2021-03-08 MED ORDER — OMEPRAZOLE 20 MG PO CPDR: 20.0000 mg | DELAYED_RELEASE_CAPSULE | Freq: Every day | ORAL | 3 refills | Status: DC

## 2021-03-08 MED ORDER — AMLODIPINE BESYLATE 2.5 MG PO TABS: 2.5000 mg | ORAL_TABLET | Freq: Every day | ORAL | 3 refills | Status: DC

## 2021-03-08 NOTE — Patient Instructions (Addendum)
Thanks for your effort with stopping smoking.  Check with your insurance to see if they will cover the shingles shot. Let me know if you need help getting a colonoscopy set up.   Take care.  Glad to see you.  Go to the lab on the way out.   If you have mychart we'll likely use that to update you.     Update me about your BP in about 10 days.  Start amlodipine in the meantime.

## 2021-03-08 NOTE — Progress Notes (Signed)
This visit occurred during the SARS-CoV-2 public health emergency.  Safety protocols were in place, including screening questions prior to the visit, additional usage of staff PPE, and extensive cleaning of exam room while observing appropriate contact time as indicated for disinfecting solutions.  CPE- See plan.  Routine anticipatory guidance given to patient.  See health maintenance.  The possibility exists that previously documented standard health maintenance information may have been brought forward from a previous encounter into this note.  If needed, that same information has been updated to reflect the current situation based on today's encounter.    Tetanus 2016 Flu done yearly.  PNA d/w pt. Shingles d/w pt. covid vaccine 2021 Colonoscopy 2015.  I asked him to call about follow up 2022.   Prostate cancer screening and PSA options(with potential risks and benefits of testing vs not testing) were discussed along with recent recs/guidelines. He opts in.  Living will d/w pt. Wife designated if patient were incapacitated.  Diet and exercise d/w pt. A lot of exercise at work.  He is cutting back on smoking.  D/w pt.  down to rare use.   Not due for AAA screening.    He is taking BID nsaid with PPI at baseline.  No GI sx. tolerating NSAIDs.  BP elevation d/w pt.  He rushing to get here.  He hasn't checked BP at home but it was mildly elevated previously at dental clinic.    ED improved with sildenafil.  No ADE on med.    PMH and SH reviewed  Meds, vitals, and allergies reviewed.   ROS: Per HPI.  Unless specifically indicated otherwise in HPI, the patient denies:  General: fever. Eyes: acute vision changes ENT: sore throat Cardiovascular: chest pain Respiratory: SOB GI: vomiting GU: dysuria Musculoskeletal: acute back pain Derm: acute rash Neuro: acute motor dysfunction Psych: worsening mood Endocrine: polydipsia Heme: bleeding Allergy: hayfever  GEN: nad, alert  and oriented HEENT: ncat NECK: supple w/o LA CV: rrr. PULM: ctab, no inc wob ABD: soft, +bs EXT: no edema SKIN: no acute rash L4th finger contracture noted.  He will update me if he wants referral.

## 2021-03-09 LAB — COMPREHENSIVE METABOLIC PANEL
ALT: 9 U/L (ref 0–53)
AST: 14 U/L (ref 0–37)
Albumin: 4.3 g/dL (ref 3.5–5.2)
Alkaline Phosphatase: 64 U/L (ref 39–117)
BUN: 15 mg/dL (ref 6–23)
CO2: 30 mEq/L (ref 19–32)
Calcium: 9.4 mg/dL (ref 8.4–10.5)
Chloride: 103 mEq/L (ref 96–112)
Creatinine, Ser: 0.97 mg/dL (ref 0.40–1.50)
GFR: 85.53 mL/min (ref >60.00)
Glucose, Bld: 97 mg/dL (ref 70–99)
Potassium: 3.8 mEq/L (ref 3.5–5.1)
Sodium: 141 mEq/L (ref 135–145)
Total Bilirubin: 0.4 mg/dL (ref 0.2–1.2)
Total Protein: 7 g/dL (ref 6.0–8.3)

## 2021-03-09 LAB — LIPID PANEL
Cholesterol: 182 mg/dL (ref 0–200)
HDL: 31.2 mg/dL — ABNORMAL LOW (ref >39.00)
LDL Cholesterol: 115 mg/dL — ABNORMAL HIGH (ref 0–99)
NonHDL: 150.75
Total CHOL/HDL Ratio: 6
Triglycerides: 179 mg/dL — ABNORMAL HIGH (ref 0.0–149.0)
VLDL: 35.8 mg/dL (ref 0.0–40.0)

## 2021-03-09 LAB — PSA: PSA: 0.2 ng/mL (ref 0.10–4.00)

## 2021-03-10 DIAGNOSIS — I10 Essential (primary) hypertension: Secondary | ICD-10-CM | POA: Insufficient documentation

## 2021-03-10 NOTE — Assessment & Plan Note (Signed)
Living will d/w pt.  Wife designated if patient were incapacitated.   ?

## 2021-03-10 NOTE — Assessment & Plan Note (Signed)
I want him to update me about his BP in about 10 days.  Start amlodipine in the meantime.  Routine cautions given to patient.  Still okay for outpatient follow-up.  See notes on labs.

## 2021-03-10 NOTE — Assessment & Plan Note (Signed)
He is taking BID nsaid with PPI at baseline.  No GI sx. tolerating NSAIDs.  Would continue as is for now with routine cautions.

## 2021-03-10 NOTE — Assessment & Plan Note (Signed)
Continue as needed sildenafil.  No nitroglycerin use. 

## 2021-03-10 NOTE — Assessment & Plan Note (Signed)
Tetanus 2016 Flu done yearly.  PNA d/w pt. Shingles d/w pt. covid vaccine 2021 Colonoscopy 2015.  I asked him to call about follow up 2022.   Prostate cancer screening and PSA options(with potential risks and benefits of testing vs not testing) were discussed along with recent recs/guidelines. He opts in.  Living will d/w pt. Wife designated if patient were incapacitated.  Diet and exercise d/w pt. A lot of exercise at work.  He is cutting back on smoking.  D/w pt.  down to rare use.   Not due for AAA screening.

## 2021-03-15 ENCOUNTER — Telehealth: Payer: Self-pay

## 2021-03-15 MED ORDER — AMLODIPINE BESYLATE 2.5 MG PO TABS: 5.0000 mg | ORAL_TABLET | Freq: Every day | ORAL | Status: DC

## 2021-03-15 NOTE — Telephone Encounter (Signed)
Patient notified as instructed by telephone and verbalized understanding. 

## 2021-03-15 NOTE — Telephone Encounter (Signed)
Would inc amlodipine to 5mg  a day and update me about his BP in about 1 week.  Would take 2 of the 2.5mg  tabs at one time.  Thanks.

## 2021-03-15 NOTE — Telephone Encounter (Signed)
Patient called and stated that he checked his blood pressure last night and it was 173/103. Patient denied SOB, chest pain, lightheadedness, dizziness, palpitations, or other acute issues. Educated patient about checking blood pressure when he first gets up in the morning and in the afternoon when he has been resting for at least 15 minutes. Also informed patient to continue taking medication as prescribed, drink plenty of water, and to eat a low sodium diet. UC and ED precautions given. Patient verbalized understanding.

## 2021-03-15 NOTE — Addendum Note (Signed)
Addended by: Joaquim Nam on: 03/15/2021 01:13 PM   Modules accepted: Orders

## 2021-03-26 ENCOUNTER — Other Ambulatory Visit: Payer: Self-pay | Admitting: Family Medicine

## 2021-04-19 ENCOUNTER — Telehealth: Payer: Self-pay | Admitting: *Deleted

## 2021-04-19 MED ORDER — AMLODIPINE BESYLATE 10 MG PO TABS: 10.0000 mg | ORAL_TABLET | Freq: Every day | ORAL | 1 refills | Status: DC

## 2021-04-19 NOTE — Telephone Encounter (Signed)
With his diastolic still that high, I would try inc amlodipine to 10mg  a day.  I sent a new rx- he'll only take 1 tab a day since it is 10mg /tab.  Please update me about his BP in about 10 days.  If he has more ankle swelling on the medicine, then let me know.  That can occ happen on higher doses of amlodipine.  Thanks.

## 2021-04-19 NOTE — Telephone Encounter (Signed)
Patient called stating that he was calling to report a blood pressure reading today of 140/106. Patient denies a headache or any other symptoms. Patient stated that he is down to his last two pills. Patient stated that he was advised to increase his medication to two pills a day. Patient stated that he is not sure if Dr. Para March wants to try something different or not. Patient stated if he is to continue with his current blood pressure medication he needs a refill sent to the pharmacy with the new directions.  [facility]

## 2021-04-19 NOTE — Telephone Encounter (Signed)
Spoke with patient and advised to increase his amlodipine 10 mg tablets QD; advised new rx is at pharmacy. I also advised patient to call back in 10 days with BP readings. Patient aware to watch for ankle swelling from medication.

## 2021-04-23 NOTE — Telephone Encounter (Signed)
Pt left v/m requesting status of BP pills being called to pharmacy. Pt request cb.

## 2021-05-11 ENCOUNTER — Other Ambulatory Visit: Payer: Self-pay | Admitting: Family Medicine

## 2021-05-12 NOTE — Telephone Encounter (Signed)
Sent. Thanks.   

## 2021-05-12 NOTE — Telephone Encounter (Signed)
Refill request Cyclobenzaprine Last refill 01/19/21 #30/3 Last office visit 03/08/21

## 2021-05-19 ENCOUNTER — Other Ambulatory Visit: Payer: Self-pay | Admitting: Family Medicine

## 2021-06-05 ENCOUNTER — Other Ambulatory Visit: Payer: Self-pay | Admitting: Family Medicine

## 2021-06-14 MED ORDER — OMEPRAZOLE 20 MG PO CPDR: DELAYED_RELEASE_CAPSULE | ORAL | 3 refills | Status: DC

## 2021-06-14 NOTE — Telephone Encounter (Signed)
This is the first time I have seen this request.  Rx sent.  Please update patient.   I got a message about erx failure on initial attempt but it appears to have been resent successfully on retry.  Please check with pharmacy on Tuesday.  Call in if needed.  Thanks.

## 2021-06-16 NOTE — Telephone Encounter (Signed)
Pharmacy confirmed rx received.

## 2021-07-14 ENCOUNTER — Other Ambulatory Visit: Payer: Self-pay | Admitting: Family Medicine

## 2021-07-15 ENCOUNTER — Telehealth: Payer: Self-pay

## 2021-07-15 NOTE — Telephone Encounter (Signed)
Vm from pt requesting a call back.  Spoke with pt returning his call.  Pt states he's getting "backed up" with extra bottles of ibuprofen and cyclobenzaprine.  He was asking that we not send anymore refills.  Asked if he was on auto-refill with the pharmacy, thinks he is.  I advised pt to call the pharmacy to cancel that program and let them know he will contact them when he's ready for a refill.  Pt verbalizes understanding and expresses his thanks.

## 2021-08-14 ENCOUNTER — Emergency Department: Payer: 59

## 2021-08-14 ENCOUNTER — Inpatient Hospital Stay
Admission: EM | Admit: 2021-08-14 | Discharge: 2021-08-17 | DRG: 983 | Disposition: A | Discharge status: Home / Self Care | Payer: 59 | Attending: Internal Medicine | Admitting: Internal Medicine

## 2021-08-14 ENCOUNTER — Other Ambulatory Visit: Payer: Self-pay

## 2021-08-14 DIAGNOSIS — H538 Other visual disturbances: Secondary | ICD-10-CM | POA: Diagnosis present

## 2021-08-14 DIAGNOSIS — R29700 NIHSS score 0: Secondary | ICD-10-CM | POA: Diagnosis present

## 2021-08-14 DIAGNOSIS — K219 Gastro-esophageal reflux disease without esophagitis: Secondary | ICD-10-CM | POA: Diagnosis present

## 2021-08-14 DIAGNOSIS — I10 Essential (primary) hypertension: Secondary | ICD-10-CM | POA: Diagnosis not present

## 2021-08-14 DIAGNOSIS — Z96643 Presence of artificial hip joint, bilateral: Secondary | ICD-10-CM | POA: Diagnosis present

## 2021-08-14 DIAGNOSIS — Z20822 Contact with and (suspected) exposure to covid-19: Secondary | ICD-10-CM | POA: Diagnosis present

## 2021-08-14 DIAGNOSIS — G459 Transient cerebral ischemic attack, unspecified: Secondary | ICD-10-CM | POA: Diagnosis not present

## 2021-08-14 DIAGNOSIS — I639 Cerebral infarction, unspecified: Principal | ICD-10-CM | POA: Diagnosis present

## 2021-08-14 DIAGNOSIS — F17210 Nicotine dependence, cigarettes, uncomplicated: Secondary | ICD-10-CM | POA: Diagnosis present

## 2021-08-14 DIAGNOSIS — I351 Nonrheumatic aortic (valve) insufficiency: Secondary | ICD-10-CM | POA: Diagnosis present

## 2021-08-14 DIAGNOSIS — R519 Headache, unspecified: Secondary | ICD-10-CM | POA: Diagnosis not present

## 2021-08-14 DIAGNOSIS — E876 Hypokalemia: Secondary | ICD-10-CM | POA: Diagnosis present

## 2021-08-14 DIAGNOSIS — Z886 Allergy status to analgesic agent status: Secondary | ICD-10-CM

## 2021-08-14 DIAGNOSIS — Z79899 Other long term (current) drug therapy: Secondary | ICD-10-CM

## 2021-08-14 DIAGNOSIS — E785 Hyperlipidemia, unspecified: Secondary | ICD-10-CM | POA: Diagnosis present

## 2021-08-14 DIAGNOSIS — Z8249 Family history of ischemic heart disease and other diseases of the circulatory system: Secondary | ICD-10-CM

## 2021-08-14 DIAGNOSIS — Z8673 Personal history of transient ischemic attack (TIA), and cerebral infarction without residual deficits: Secondary | ICD-10-CM | POA: Diagnosis present

## 2021-08-14 LAB — CBC WITH DIFFERENTIAL/PLATELET
Abs Immature Granulocytes: 0.02 10*3/uL (ref 0.00–0.07)
Basophils Absolute: 0 10*3/uL (ref 0.0–0.1)
Basophils Relative: 0 %
Eosinophils Absolute: 0.1 10*3/uL (ref 0.0–0.5)
Eosinophils Relative: 1 %
HCT: 45.9 % (ref 39.0–52.0)
Hemoglobin: 15.4 g/dL (ref 13.0–17.0)
Immature Granulocytes: 0 %
Lymphocytes Relative: 22 %
Lymphs Abs: 1.6 10*3/uL (ref 0.7–4.0)
MCH: 30.8 pg (ref 26.0–34.0)
MCHC: 33.6 g/dL (ref 30.0–36.0)
MCV: 91.8 fL (ref 80.0–100.0)
Monocytes Absolute: 0.6 10*3/uL (ref 0.1–1.0)
Monocytes Relative: 9 %
Neutro Abs: 4.7 10*3/uL (ref 1.7–7.7)
Neutrophils Relative %: 68 %
Platelets: 239 10*3/uL (ref 150–400)
RBC: 5 MIL/uL (ref 4.22–5.81)
RDW: 13 % (ref 11.5–15.5)
WBC: 7 10*3/uL (ref 4.0–10.5)
nRBC: 0 % (ref 0.0–0.2)

## 2021-08-14 LAB — APTT: aPTT: 25 seconds (ref 24–36)

## 2021-08-14 LAB — COMPREHENSIVE METABOLIC PANEL
ALT: 13 U/L (ref 0–44)
AST: 20 U/L (ref 15–41)
Albumin: 4.6 g/dL (ref 3.5–5.0)
Alkaline Phosphatase: 60 U/L (ref 38–126)
Anion gap: 6 (ref 5–15)
BUN: 13 mg/dL (ref 6–20)
CO2: 31 mmol/L (ref 22–32)
Calcium: 9.5 mg/dL (ref 8.9–10.3)
Chloride: 104 mmol/L (ref 98–111)
Creatinine, Ser: 0.85 mg/dL (ref 0.61–1.24)
GFR, Estimated: >60 mL/min (ref >60)
Glucose, Bld: 110 mg/dL — ABNORMAL HIGH (ref 70–99)
Potassium: 3.3 mmol/L — ABNORMAL LOW (ref 3.5–5.1)
Sodium: 141 mmol/L (ref 135–145)
Total Bilirubin: 0.7 mg/dL (ref 0.3–1.2)
Total Protein: 8.2 g/dL — ABNORMAL HIGH (ref 6.5–8.1)

## 2021-08-14 LAB — CBC
HCT: 40.3 % (ref 39.0–52.0)
Hemoglobin: 13.9 g/dL (ref 13.0–17.0)
MCH: 31.4 pg (ref 26.0–34.0)
MCHC: 34.5 g/dL (ref 30.0–36.0)
MCV: 91.2 fL (ref 80.0–100.0)
Platelets: 207 10*3/uL (ref 150–400)
RBC: 4.42 MIL/uL (ref 4.22–5.81)
RDW: 13 % (ref 11.5–15.5)
WBC: 5.8 10*3/uL (ref 4.0–10.5)
nRBC: 0 % (ref 0.0–0.2)

## 2021-08-14 LAB — TSH: TSH: 0.401 u[IU]/mL (ref 0.350–4.500)

## 2021-08-14 LAB — PROTIME-INR
INR: 1 (ref 0.8–1.2)
Prothrombin Time: 13.5 seconds (ref 11.4–15.2)

## 2021-08-14 MED ORDER — ASPIRIN EC 81 MG PO TBEC
81.0000 mg | DELAYED_RELEASE_TABLET | Freq: Every day | ORAL | Status: DC
  Administered 2021-08-15: 81 mg via ORAL
  Filled 2021-08-14: qty 1

## 2021-08-14 MED ORDER — STROKE: EARLY STAGES OF RECOVERY BOOK: Freq: Once | Status: AC

## 2021-08-14 MED ORDER — ACETAMINOPHEN 160 MG/5ML PO SOLN
650.0000 mg | ORAL | Status: DC | PRN
  Filled 2021-08-14: qty 20.3

## 2021-08-14 MED ORDER — SENNOSIDES-DOCUSATE SODIUM 8.6-50 MG PO TABS: 1.0000 | ORAL_TABLET | Freq: Every evening | ORAL | Status: DC | PRN

## 2021-08-14 MED ORDER — ATORVASTATIN CALCIUM 20 MG PO TABS
80.0000 mg | ORAL_TABLET | Freq: Every day | ORAL | Status: DC
  Administered 2021-08-14: 80 mg via ORAL
  Filled 2021-08-14 (×2): qty 4

## 2021-08-14 MED ORDER — ACETAMINOPHEN 650 MG RE SUPP: 650.0000 mg | RECTAL | Status: DC | PRN

## 2021-08-14 MED ORDER — ACETAMINOPHEN 325 MG PO TABS
650.0000 mg | ORAL_TABLET | ORAL | Status: DC | PRN
  Administered 2021-08-17: 650 mg via ORAL
  Filled 2021-08-14: qty 2

## 2021-08-14 MED ORDER — PANTOPRAZOLE SODIUM 40 MG PO TBEC
40.0000 mg | DELAYED_RELEASE_TABLET | Freq: Every day | ORAL | Status: DC
  Administered 2021-08-15 – 2021-08-17 (×2): 40 mg via ORAL
  Filled 2021-08-14 (×2): qty 1

## 2021-08-14 MED ORDER — LORAZEPAM 2 MG/ML IJ SOLN
0.5000 mg | Freq: Once | INTRAMUSCULAR | Status: AC | PRN
  Administered 2021-08-14: 0.5 mg via INTRAVENOUS
  Filled 2021-08-14: qty 1

## 2021-08-14 MED ORDER — AMLODIPINE BESYLATE 10 MG PO TABS
10.0000 mg | ORAL_TABLET | Freq: Every day | ORAL | Status: DC
  Administered 2021-08-15: 10 mg via ORAL
  Filled 2021-08-14: qty 1

## 2021-08-14 MED ORDER — POTASSIUM CHLORIDE CRYS ER 20 MEQ PO TBCR
30.0000 meq | EXTENDED_RELEASE_TABLET | ORAL | Status: AC
  Administered 2021-08-14 (×2): 30 meq via ORAL
  Filled 2021-08-14: qty 2
  Filled 2021-08-14: qty 1

## 2021-08-14 MED ORDER — ENOXAPARIN SODIUM 40 MG/0.4ML IJ SOSY
40.0000 mg | PREFILLED_SYRINGE | INTRAMUSCULAR | Status: DC
  Administered 2021-08-15 – 2021-08-17 (×2): 40 mg via SUBCUTANEOUS
  Filled 2021-08-14 (×2): qty 0.4

## 2021-08-14 NOTE — ED Notes (Signed)
Pt discussed with dr Cyril Loosen. Fisher PA at bedside

## 2021-08-14 NOTE — ED Notes (Signed)
Patient transported to MRI 

## 2021-08-14 NOTE — ED Notes (Signed)
Assumed care of patient, updated to plan of care. Pt states vision is back to baseline. Denies needs at this time. Spouse at bedside. Offered warm blankets. Call light in reach.

## 2021-08-14 NOTE — ED Triage Notes (Signed)
Pt to ED for blurred vision of right eye that started 2.5 hours ago while at church. States lowered head for prayer, raised head up and vision was altered in right eye with mild h/a behind right eye. Took ASA pta, states h/a feels better. Denies weakness. Clear speech, alert and oriented.

## 2021-08-14 NOTE — ED Provider Notes (Signed)
[facility] Emergency Department Provider Note  ____________________________________________   Event Date/Time   First MD Initiated Contact with Patient 08/14/21 1440     (approximate)  I have reviewed the triage vital signs and the nursing notes.   HISTORY  Chief Complaint Blurred Vision    HPI MANA MORISON is a 60 y.o. male presents emergency department after a sudden onset of blurred vision in the right eye.  Patient states he was bowing his head to pray when he raised his head and had a sharp pain in the front side of his forehead and behind his eye with blurred vision.  Patient states the pain has dissipated since his wife gave him aspirin but still has blurred vision.  never had difficulty with his eye or vision.  No history of glaucoma.  Patient does have history of hypertension  Past Medical History:  Diagnosis Date   Arthritis    Coffee ground emesis    2016 after nsaid use   Dupuytren contracture 06/2013   right long, ring, small fingers   GERD (gastroesophageal reflux disease)    uses OTC as needed    Patient Active Problem List   Diagnosis Date Noted   Hypertension 03/10/2021   Lateral pain of left hip 04/11/2020   Pain of left heel 04/11/2020   Injury of right foot 12/22/2019   Snoring 12/22/2019   GERD (gastroesophageal reflux disease) 02/03/2019   Smoker 11/19/2015   RBBB 11/09/2015   Hip arthritis 11/03/2015   Erectile dysfunction 02/05/2015   Advance care planning 02/05/2015   FH: colon cancer 06/26/2014   Routine general medical examination at a health care facility 07/03/2012   Dupuytren contracture 07/03/2012    Past Surgical History:  Procedure Laterality Date   DUPUYTREN CONTRACTURE RELEASE Right 06/27/2013   Procedure: EXCISION DUPUYTRENS RIGHT LONG AND RING  FINGERS;  Surgeon: Wyn Forster., MD;  Location: [facility];  Service: Orthopedics;  Laterality: Right;   TOTAL HIP ARTHROPLASTY Left  11/25/2015   Procedure: TOTAL HIP ARTHROPLASTY;  Surgeon: Deeann Saint, MD;  Location: [facility];  Service: Orthopedics;  Laterality: Left;   TOTAL HIP ARTHROPLASTY Right    2020    Prior to Admission medications   Medication Sig Start Date End Date Taking? Authorizing Provider  amLODipine (NORVASC) 10 MG tablet Take 1 tablet (10 mg total) by mouth daily. 04/19/21   Joaquim Nam, MD  cyclobenzaprine (FLEXERIL) 10 MG tablet TAKE 1 TABLET (10 MG TOTAL) BY MOUTH DAILY AS NEEDED FOR MUSCLE SPASMS (SEDATION CAUTION.). 05/12/21   Joaquim Nam, MD  ibuprofen (ADVIL) 800 MG tablet TAKE 1 TABLET BY MOUTH EVERY 8 HOURS AS NEEDED WITH FOOD (IF ANY GI UPSET, STOP MED & TELL MD) 07/14/21   Joaquim Nam, MD  omeprazole (PRILOSEC) 20 MG capsule TAKE 1 CAPSULE BY MOUTH EVERY DAY 06/14/21   Joaquim Nam, MD  sildenafil (REVATIO) 20 MG tablet Take 1-5 tablets (20-100 mg total) by mouth daily as needed. 08/07/20   Joaquim Nam, MD    Allergies Nsaids  Family History  Problem Relation Age of Onset   Hypertension Father    Diabetes Father    Heart Problems Father    Diabetes Mother    Hypertension Mother    Dementia Mother    Colon cancer Brother    Colon cancer Brother    Prostate cancer Neg Hx     Social History Social History   Tobacco  Use   Smoking status: Light Smoker    Packs/day: 1.00    Years: 20.00    Pack years: 20.00    Types: Cigarettes   Smokeless tobacco: Never  Substance Use Topics   Alcohol use: No    Alcohol/week: 0.0 standard drinks   Drug use: No    Review of Systems  Constitutional: No fever/chills Eyes: Positive visual changes. ENT: No sore throat. Respiratory: Denies cough Cardiovascular: Denies chest pain Gastrointestinal: Denies abdominal pain Genitourinary: Negative for dysuria. Musculoskeletal: Negative for back pain. Skin: Negative for rash. Psychiatric: no mood changes,     ____________________________________________   PHYSICAL  EXAM:  VITAL SIGNS: ED Triage Vitals  Enc Vitals Group     BP 08/14/21 1432 132/69     Pulse Rate 08/14/21 1432 65     Resp 08/14/21 1432 20     Temp 08/14/21 1432 98.5 F (36.9 C)     Temp Source 08/14/21 1432 Oral     SpO2 08/14/21 1432 99 %     Weight 08/14/21 1433 226 lb (102.5 kg)     Height 08/14/21 1433 6\' 1"  (1.854 m)     Head Circumference --      Peak Flow --      Pain Score --      Pain Loc --      Pain Edu? --      Excl. in GC? --     Constitutional: Alert and oriented. Well appearing and in no acute distress. Eyes: Conjunctivae are normal. Perrl eomi Head: Atraumatic. Nose: No congestion/rhinnorhea. Mouth/Throat: Mucous membranes are moist.   Neck:  supple no lymphadenopathy noted Cardiovascular: Normal rate, regular rhythm. Heart sounds are normal Respiratory: Normal respiratory effort.  No retractions, lungs c t a  GU: deferred Musculoskeletal: FROM all extremities, warm and well perfused Neurologic:  Normal speech and language.  Skin:  Skin is warm, dry and intact. No rash noted. Psychiatric: Mood and affect are normal. Speech and behavior are normal.  ____________________________________________   LABS (all labs ordered are listed, but only abnormal results are displayed)  Labs Reviewed  COMPREHENSIVE METABOLIC PANEL - Abnormal; Notable for the following components:      Result Value   Potassium 3.3 (*)    Glucose, Bld 110 (*)    Total Protein 8.2 (*)    All other components within normal limits  CBC WITH DIFFERENTIAL/PLATELET  PROTIME-INR  APTT   ____________________________________________   ____________________________________________  RADIOLOGY  CT of the head MRI brain, MRA brain  ____________________________________________   PROCEDURES  Procedure(s) performed: No  Procedures    ____________________________________________   INITIAL IMPRESSION / ASSESSMENT AND PLAN / ED COURSE  Pertinent labs & imaging results that  were available during my care of the patient were reviewed by me and considered in my medical decision making (see chart for details).   Patient is a 60 year old male presents emergency department with sudden onset of blurred vision.  See HPI.  Physical exam shows patient per stable  DDx: CVA, glaucoma, optic neuritis  Labs are reassuring, CBC, metabolic panel, PT and PTT are normal  CT head is negative for any acute abnormality, reviewed by me, radiology  MRI of the brain  Radiology called and saw a hyperattenuation, MRA ordered  MRi/MRA shows infarct right frontal lobe  Consult to hospitalist for admission  Dr bradler spoke with the hospitalist for admission, pt is in agreement with the treatment plan and is in stable condition.  DONNIS PHANEUF was evaluated in Emergency Department on 08/14/2021 for the symptoms described in the history of present illness. He was evaluated in the context of the global COVID-19 pandemic, which necessitated consideration that the patient might be at risk for infection with the SARS-CoV-2 virus that causes COVID-19. Institutional protocols and algorithms that pertain to the evaluation of patients at risk for COVID-19 are in a state of rapid change based on information released by regulatory bodies including the CDC and federal and state organizations. These policies and algorithms were followed during the patient's care in the ED.    As part of my medical decision making, I reviewed the following data within the electronic MEDICAL RECORD NUMBER History obtained from family, Nursing notes reviewed and incorporated, Labs reviewed , Old chart reviewed, Radiograph reviewed , Discussed with admitting physician , Evaluated by EM attending , Notes from prior ED visits, and [place] Controlled Substance Database  ____________________________________________   FINAL CLINICAL IMPRESSION(S) / ED DIAGNOSES  Final diagnoses:  TIA (transient ischemic attack)      NEW  MEDICATIONS STARTED DURING THIS VISIT:  New Prescriptions   No medications on file     Note:  This document was prepared using Dragon voice recognition software and may include unintentional dictation errors.    Faythe Ghee, PA-C 08/14/21 1853    Merwyn Katos, MD 08/18/21 [phone]

## 2021-08-14 NOTE — ED Notes (Signed)
Request made for transport to the floor ?

## 2021-08-14 NOTE — H&P (Signed)
History and Physical    Michael Chung JJO:[id number] DOB: 07-29-1961 DOA: 08/14/2021  PCP: Joaquim Nam, MD   Patient coming from: home  Chief Complaint: right eye blurriness  HPI: Michael Chung is a 60 y.o. with pertinent medical history of history of smoking, hypertension on amlodipine 10mg , who presented to [facility] ED with sudden onset of blurred vision in the right eye.  Patient states he was bowing his head to pray when he raised his head and had a sharp pain in the front side of his forehead and behind his eye with blurred vision, like frosted glass, not a particular field, not a curtain coming across the eye.  Patient states the pain has dissipated since his wife gave him aspirin 81mg , but still has blurred vision although improving.  No trauma or injuries to the neck.  States blood pressure has been somewhat elevated still at home, wife stating 140's-150's systolics.  In the ED, somewhat hypertensive, pulse 50's and asymptomatic, 97% on room air Wbc 7, hgb 14.5, plt 239, K 3.3, 0.85 scr, 110 glucose CT head is negative for any acute abnormality, reviewed by me, radiology MRI of the brain - Radiology called in the ED and saw a hyperattenuation, MRA ordered  MRi/MRA shows infarct right frontal lobe  Review of Systems: As per HPI otherwise 10 point review of systems negative.  Other pertinents as below:  General - denies recent illness, weight loss,  HEENT - denies head trauma, new headache but has visual changes Cardio - denies cp, palpitations ever Resp - denies sob, cough GI - denies n/v/d/GI pain, hematochezia or melena or rcent GI illness GU - has some chornic polyuria MSK - denies new joint or back pain Skin - denies new skin changes or rashes Neuro - as per hpi, denies any new numbness or weakness or jarbled speech or ataxic gait Psych - denies new depression or anxiety   Past Medical History:  Diagnosis Date   Arthritis    Coffee ground emesis    2016 after nsaid  use   Dupuytren contracture 06/2013   right long, ring, small fingers   GERD (gastroesophageal reflux disease)    uses OTC as needed    Past Surgical History:  Procedure Laterality Date   DUPUYTREN CONTRACTURE RELEASE Right 06/27/2013   Procedure: EXCISION DUPUYTRENS RIGHT LONG AND RING  FINGERS;  Surgeon: 07/2013., MD;  Location: [facility];  Service: Orthopedics;  Laterality: Right;   TOTAL HIP ARTHROPLASTY Left 11/25/2015   Procedure: TOTAL HIP ARTHROPLASTY;  Surgeon: Wyn Forster, MD;  Location: [facility];  Service: Orthopedics;  Laterality: Left;   TOTAL HIP ARTHROPLASTY Right    2020     reports that he has been smoking cigarettes. He has a 20.00 pack-year smoking history. He has never used smokeless tobacco. He reports that he does not drink alcohol and does not use drugs.  Allergies  Allergen Reactions   Nsaids Other (See Comments)    H/o coffee ground emesis    Family History  Problem Relation Age of Onset   Hypertension Father    Diabetes Father    Heart Problems Father    Diabetes Mother    Hypertension Mother    Dementia Mother    Colon cancer Brother    Colon cancer Brother    Prostate cancer Neg Hx      Prior to Admission medications   Medication Sig Start Date End Date Taking? Authorizing Provider  amLODipine (NORVASC) 10 MG tablet Take 1 tablet (10 mg total) by mouth daily. 04/19/21   Joaquim Nam, MD  cyclobenzaprine (FLEXERIL) 10 MG tablet TAKE 1 TABLET (10 MG TOTAL) BY MOUTH DAILY AS NEEDED FOR MUSCLE SPASMS (SEDATION CAUTION.). 05/12/21   Joaquim Nam, MD  ibuprofen (ADVIL) 800 MG tablet TAKE 1 TABLET BY MOUTH EVERY 8 HOURS AS NEEDED WITH FOOD (IF ANY GI UPSET, STOP MED & TELL MD) 07/14/21   Joaquim Nam, MD  omeprazole (PRILOSEC) 20 MG capsule TAKE 1 CAPSULE BY MOUTH EVERY DAY 06/14/21   Joaquim Nam, MD  sildenafil (REVATIO) 20 MG tablet Take 1-5 tablets (20-100 mg total) by mouth daily as needed. 08/07/20   Joaquim Nam, MD    Physical Exam: Vitals:   08/14/21 1600 08/14/21 1725 08/14/21 1730 08/14/21 1800  BP: (!) 164/85 (!) 146/91 134/82 (!) 146/78  Pulse: (!) 55 (!) 54 (!) 57 (!) 53  Resp:  13 10 (!) 9  Temp:      TempSrc:      SpO2: 97% 99% 98% 97%  Weight:      Height:        Constitutional: NAD, comfortable Eyes: pupils are equal and both reactive EOMI, no double vision noted, conjugate gaze, all fields 4/4 can see numbers ENMT: MMM, throat without exudates or erythema Neck: normal, supple, no masses, no thyromegaly noted Respiratory: CTAB, nwob  Cardiovascular: rrr w/o mrg, warm extremities Abdomen: NBS, NT,   Musculoskeletal: moving all 4 extremities, strength grossly intact 5/5 in the UE and LE's, DTR's Skin: no rashes, lesions, ulcers. No induration Neurologic: CN 2-12 grossly intact. Sensation intact Psychiatric: AO appearing, mentation appropriate  Labs on Admission: I have personally reviewed following labs and imaging studies  CBC: Recent Labs  Lab 08/14/21 1454  WBC 7.0  NEUTROABS 4.7  HGB 15.4  HCT 45.9  MCV 91.8  PLT 239   Basic Metabolic Panel: Recent Labs  Lab 08/14/21 1454  NA 141  K 3.3*  CL 104  CO2 31  GLUCOSE 110*  BUN 13  CREATININE 0.85  CALCIUM 9.5   GFR: Estimated Creatinine Clearance: 117.7 mL/min (by C-G formula based on SCr of 0.85 mg/dL). Liver Function Tests: Recent Labs  Lab 08/14/21 1454  AST 20  ALT 13  ALKPHOS 60  BILITOT 0.7  PROT 8.2*  ALBUMIN 4.6   No results for input(s): LIPASE, AMYLASE in the last 168 hours. No results for input(s): AMMONIA in the last 168 hours. Coagulation Profile: Recent Labs  Lab 08/14/21 1454  INR 1.0   Cardiac Enzymes: No results for input(s): CKTOTAL, CKMB, CKMBINDEX, TROPONINI in the last 168 hours. BNP (last 3 results) No results for input(s): PROBNP in the last 8760 hours. HbA1C: No results for input(s): HGBA1C in the last 72 hours. CBG: No results for input(s): GLUCAP in  the last 168 hours. Lipid Profile: No results for input(s): CHOL, HDL, LDLCALC, TRIG, CHOLHDL, LDLDIRECT in the last 72 hours. Thyroid Function Tests: No results for input(s): TSH, T4TOTAL, FREET4, T3FREE, THYROIDAB in the last 72 hours. Anemia Panel: No results for input(s): VITAMINB12, FOLATE, FERRITIN, TIBC, IRON, RETICCTPCT in the last 72 hours. Urine analysis:    Component Value Date/Time   COLORURINE YELLOW (A) 11/16/2015 1516   APPEARANCEUR CLEAR (A) 11/16/2015 1516   LABSPEC 1.021 11/16/2015 1516   PHURINE 5.0 11/16/2015 1516   GLUCOSEU NEGATIVE 11/16/2015 1516   HGBUR NEGATIVE 11/16/2015 1516   BILIRUBINUR NEGATIVE 11/16/2015 1516  BILIRUBINUR Neg 11/07/2013 1304   KETONESUR NEGATIVE 11/16/2015 1516   PROTEINUR NEGATIVE 11/16/2015 1516   UROBILINOGEN negative 11/07/2013 1304   NITRITE NEGATIVE 11/16/2015 1516   LEUKOCYTESUR NEGATIVE 11/16/2015 1516    Radiological Exams on Admission: CT HEAD WO CONTRAST (5MM)  Result Date: 08/14/2021 CLINICAL DATA:  Headache EXAM: CT HEAD WITHOUT CONTRAST TECHNIQUE: Contiguous axial images were obtained from the base of the skull through the vertex without intravenous contrast. COMPARISON:  None. FINDINGS: Brain: No acute infarct or hemorrhage. Lateral ventricles and midline structures are unremarkable. No acute extra-axial fluid collections. No mass effect. Vascular: No hyperdense vessel or unexpected calcification. Skull: Normal. Negative for fracture or focal lesion. Sinuses/Orbits: Polypoid mucosal thickening within the right sphenoid sinus. Remaining paranasal sinuses are clear. Other: None. IMPRESSION: 1. No acute intracranial process. 2. Right sphenoid sinus disease. Electronically Signed   By: Sharlet SalinaMichael  Brown M.D.   On: 08/14/2021 15:27   MR ANGIO HEAD WO CONTRAST  Result Date: 08/14/2021 CLINICAL DATA:  Neuro deficit, acute, stroke suspected EXAM: MRI HEAD WITHOUT CONTRAST MRA HEAD WITHOUT CONTRAST TECHNIQUE: Multiplanar, multi-echo  pulse sequences of the brain and surrounding structures were acquired without intravenous contrast. Angiographic images of the Circle of Willis were acquired using MRA technique without intravenous contrast. COMPARISON:  Same day CT head. FINDINGS: MRI HEAD FINDINGS Brain: Small acute infarct in the right frontal white matter. Slight edema without mass effect. Otherwise, mild T2 hyperintensity within the white matter, nonspecific but compatible with chronic microvascular ischemic disease. Vascular: See below. Skull and upper cervical spine: Normal marrow signal. Sinuses/Orbits: Right sphenoid sinus mucosal thickening with air-fluid level. Otherwise, clear sinuses. Unremarkable orbits. Other: Trace mastoid effusions. MRA HEAD FINDINGS Motion limited evaluation.  Within this limitation: Anterior circulation: Bilateral intracranial ICAs are patent with mild paraclinoid ICA stenosis. Bilateral MCAs are patent without high-grade proximal stenosis. No proximal M2 branch occlusion. Limited distal evaluation due to motion. Bilateral A1 ACAs and A2 ACAs are patent proximally. Posterior circulation: Bilateral intradural vertebral arteries, basilar artery, and posterior cerebral arteries are patent without proximal high-grade stenosis. Bilateral posterior communicating arteries. No aneurysm identified. Anatomic variants: See above. IMPRESSION: MRI: 1. Small acute infarct in the right frontal white matter. Slight edema without mass effect. 2. Mild chronic microvascular ischemic disease. MRA: 1. No large vessel occlusion or proximal high-grade stenosis. 2. Mild bilateral ICA stenosis. Electronically Signed   By: Feliberto HartsFrederick S Jones M.D.   On: 08/14/2021 17:50   MR Brain Wo Contrast (neuro protocol)  Result Date: 08/14/2021 CLINICAL DATA:  Neuro deficit, acute, stroke suspected EXAM: MRI HEAD WITHOUT CONTRAST MRA HEAD WITHOUT CONTRAST TECHNIQUE: Multiplanar, multi-echo pulse sequences of the brain and surrounding structures  were acquired without intravenous contrast. Angiographic images of the Circle of Willis were acquired using MRA technique without intravenous contrast. COMPARISON:  Same day CT head. FINDINGS: MRI HEAD FINDINGS Brain: Small acute infarct in the right frontal white matter. Slight edema without mass effect. Otherwise, mild T2 hyperintensity within the white matter, nonspecific but compatible with chronic microvascular ischemic disease. Vascular: See below. Skull and upper cervical spine: Normal marrow signal. Sinuses/Orbits: Right sphenoid sinus mucosal thickening with air-fluid level. Otherwise, clear sinuses. Unremarkable orbits. Other: Trace mastoid effusions. MRA HEAD FINDINGS Motion limited evaluation.  Within this limitation: Anterior circulation: Bilateral intracranial ICAs are patent with mild paraclinoid ICA stenosis. Bilateral MCAs are patent without high-grade proximal stenosis. No proximal M2 branch occlusion. Limited distal evaluation due to motion. Bilateral A1 ACAs and A2 ACAs are patent proximally.  Posterior circulation: Bilateral intradural vertebral arteries, basilar artery, and posterior cerebral arteries are patent without proximal high-grade stenosis. Bilateral posterior communicating arteries. No aneurysm identified. Anatomic variants: See above. IMPRESSION: MRI: 1. Small acute infarct in the right frontal white matter. Slight edema without mass effect. 2. Mild chronic microvascular ischemic disease. MRA: 1. No large vessel occlusion or proximal high-grade stenosis. 2. Mild bilateral ICA stenosis. Electronically Signed   By: Feliberto Harts M.D.   On: 08/14/2021 17:50    EKG: ordering an EKG for baseline, h/o RBBB in the past apparently  Assessment/Plan Active Problems:   TIA (transient ischemic attack)   Hypertension   GERD (gastroesophageal reflux disease)  TIA/acute stroke --neuro consult, neuro checks, advised family and pt to monitor for any new emergence of symptoms --will  start back amlodipine 10mg  and titrate tomorrow --starting baby aspirin, consider plavix, defer to neurology --start atorvastatin 80mg , hopefully pt can tolerate, educated on secondary stroke prevention --TTE, telemetry, ?Carotid ultrasound still needed, appreciate neurology's thouhts on this --tsh, a1c, lipid profile, s/p INR --follow up on ekg for baseline  Hypokalemia - replacing with x2, repeat in the am    HTN-amlodipine 10mg , as above GERD-omeprazole equivalent H/o smoking, hypertriglyceridemia in the past   Patient and/or Family completely agreed with the plan, expressed understanding and I answered all questions.  DVT prophylaxis: Lovenox SQ Code Status: Full code Family Communication: wife of 30 some years at bedside  Disposition Plan: home Consults called: Messaged Neurology   Admission status: observation for now     A total of 75 minutes utilized during this admission.  DO [facility]   If 7PM-7AM, please contact night-coverage [email or web address] Password [facility]  08/14/2021, 7:38 PM

## 2021-08-15 ENCOUNTER — Observation Stay: Payer: 59

## 2021-08-15 ENCOUNTER — Encounter: Payer: Self-pay | Admitting: Internal Medicine

## 2021-08-15 ENCOUNTER — Observation Stay
Admit: 2021-08-15 | Discharge: 2021-08-15 | Disposition: A | Discharge status: Home / Self Care | Payer: 59 | Attending: Internal Medicine | Admitting: Internal Medicine

## 2021-08-15 DIAGNOSIS — R29700 NIHSS score 0: Secondary | ICD-10-CM | POA: Diagnosis not present

## 2021-08-15 DIAGNOSIS — I672 Cerebral atherosclerosis: Secondary | ICD-10-CM | POA: Diagnosis not present

## 2021-08-15 DIAGNOSIS — I63231 Cerebral infarction due to unspecified occlusion or stenosis of right carotid arteries: Secondary | ICD-10-CM

## 2021-08-15 DIAGNOSIS — I10 Essential (primary) hypertension: Secondary | ICD-10-CM

## 2021-08-15 DIAGNOSIS — Z8249 Family history of ischemic heart disease and other diseases of the circulatory system: Secondary | ICD-10-CM | POA: Diagnosis not present

## 2021-08-15 DIAGNOSIS — I6529 Occlusion and stenosis of unspecified carotid artery: Secondary | ICD-10-CM | POA: Diagnosis not present

## 2021-08-15 DIAGNOSIS — E876 Hypokalemia: Secondary | ICD-10-CM | POA: Diagnosis not present

## 2021-08-15 DIAGNOSIS — E785 Hyperlipidemia, unspecified: Secondary | ICD-10-CM | POA: Diagnosis not present

## 2021-08-15 DIAGNOSIS — K219 Gastro-esophageal reflux disease without esophagitis: Secondary | ICD-10-CM | POA: Diagnosis not present

## 2021-08-15 DIAGNOSIS — H538 Other visual disturbances: Secondary | ICD-10-CM | POA: Diagnosis not present

## 2021-08-15 DIAGNOSIS — I6523 Occlusion and stenosis of bilateral carotid arteries: Secondary | ICD-10-CM | POA: Diagnosis not present

## 2021-08-15 DIAGNOSIS — Z886 Allergy status to analgesic agent status: Secondary | ICD-10-CM | POA: Diagnosis not present

## 2021-08-15 DIAGNOSIS — I6521 Occlusion and stenosis of right carotid artery: Secondary | ICD-10-CM | POA: Diagnosis not present

## 2021-08-15 DIAGNOSIS — Z96643 Presence of artificial hip joint, bilateral: Secondary | ICD-10-CM | POA: Diagnosis present

## 2021-08-15 DIAGNOSIS — I639 Cerebral infarction, unspecified: Secondary | ICD-10-CM | POA: Diagnosis present

## 2021-08-15 DIAGNOSIS — Z8673 Personal history of transient ischemic attack (TIA), and cerebral infarction without residual deficits: Secondary | ICD-10-CM | POA: Diagnosis not present

## 2021-08-15 DIAGNOSIS — I351 Nonrheumatic aortic (valve) insufficiency: Secondary | ICD-10-CM | POA: Diagnosis not present

## 2021-08-15 DIAGNOSIS — F17210 Nicotine dependence, cigarettes, uncomplicated: Secondary | ICD-10-CM | POA: Diagnosis present

## 2021-08-15 DIAGNOSIS — R29818 Other symptoms and signs involving the nervous system: Secondary | ICD-10-CM | POA: Diagnosis not present

## 2021-08-15 DIAGNOSIS — G459 Transient cerebral ischemic attack, unspecified: Secondary | ICD-10-CM | POA: Diagnosis not present

## 2021-08-15 DIAGNOSIS — R69 Illness, unspecified: Secondary | ICD-10-CM | POA: Diagnosis not present

## 2021-08-15 DIAGNOSIS — Z79899 Other long term (current) drug therapy: Secondary | ICD-10-CM | POA: Diagnosis not present

## 2021-08-15 DIAGNOSIS — Z20822 Contact with and (suspected) exposure to covid-19: Secondary | ICD-10-CM | POA: Diagnosis not present

## 2021-08-15 LAB — ECHOCARDIOGRAM COMPLETE
AR max vel: 4.75 cm2
AV Area VTI: 4.67 cm2
AV Area mean vel: 4.27 cm2
AV Mean grad: 3 mmHg
AV Peak grad: 6.1 mmHg
Ao pk vel: 1.23 m/s
Height: 73 in
P 1/2 time: 481 msec
S' Lateral: 3.28 cm
Weight: 3460.34 oz

## 2021-08-15 LAB — CBC
HCT: 41.3 % (ref 39.0–52.0)
Hemoglobin: 14 g/dL (ref 13.0–17.0)
MCH: 30.9 pg (ref 26.0–34.0)
MCHC: 33.9 g/dL (ref 30.0–36.0)
MCV: 91.2 fL (ref 80.0–100.0)
Platelets: 220 10*3/uL (ref 150–400)
RBC: 4.53 MIL/uL (ref 4.22–5.81)
RDW: 13 % (ref 11.5–15.5)
WBC: 4.9 10*3/uL (ref 4.0–10.5)
nRBC: 0 % (ref 0.0–0.2)

## 2021-08-15 LAB — CREATININE, SERUM
Creatinine, Ser: 0.84 mg/dL (ref 0.61–1.24)
GFR, Estimated: >60 mL/min (ref >60)

## 2021-08-15 LAB — BASIC METABOLIC PANEL
Anion gap: 5 (ref 5–15)
BUN: 18 mg/dL (ref 6–20)
CO2: 29 mmol/L (ref 22–32)
Calcium: 8.9 mg/dL (ref 8.9–10.3)
Chloride: 107 mmol/L (ref 98–111)
Creatinine, Ser: 0.92 mg/dL (ref 0.61–1.24)
GFR, Estimated: >60 mL/min (ref >60)
Glucose, Bld: 110 mg/dL — ABNORMAL HIGH (ref 70–99)
Potassium: 3.9 mmol/L (ref 3.5–5.1)
Sodium: 141 mmol/L (ref 135–145)

## 2021-08-15 LAB — LIPID PANEL
Cholesterol: 179 mg/dL (ref 0–200)
HDL: 29 mg/dL — ABNORMAL LOW (ref >40)
LDL Cholesterol: 121 mg/dL — ABNORMAL HIGH (ref 0–99)
Total CHOL/HDL Ratio: 6.2 RATIO
Triglycerides: 144 mg/dL (ref <150)
VLDL: 29 mg/dL (ref 0–40)

## 2021-08-15 LAB — HEMOGLOBIN A1C
Hgb A1c MFr Bld: 5.5 % (ref 4.8–5.6)
Mean Plasma Glucose: 111.15 mg/dL

## 2021-08-15 LAB — HIV ANTIBODY (ROUTINE TESTING W REFLEX): HIV Screen 4th Generation wRfx: NONREACTIVE

## 2021-08-15 LAB — SARS CORONAVIRUS 2 (TAT 6-24 HRS): SARS Coronavirus 2: NEGATIVE

## 2021-08-15 LAB — GLUCOSE, CAPILLARY: Glucose-Capillary: 109 mg/dL — ABNORMAL HIGH (ref 70–99)

## 2021-08-15 MED ORDER — ATORVASTATIN CALCIUM 20 MG PO TABS
80.0000 mg | ORAL_TABLET | Freq: Every day | ORAL | Status: DC
  Administered 2021-08-15 – 2021-08-16 (×2): 80 mg via ORAL
  Filled 2021-08-15 (×2): qty 4

## 2021-08-15 MED ORDER — CLOPIDOGREL BISULFATE 75 MG PO TABS
75.0000 mg | ORAL_TABLET | Freq: Every day | ORAL | Status: DC
  Administered 2021-08-17: 75 mg via ORAL
  Filled 2021-08-15: qty 1

## 2021-08-15 MED ORDER — IOHEXOL 350 MG/ML SOLN
75.0000 mL | Freq: Once | INTRAVENOUS | Status: AC | PRN
  Administered 2021-08-15: 75 mL via INTRAVENOUS

## 2021-08-15 MED ORDER — AMLODIPINE BESYLATE 10 MG PO TABS
10.0000 mg | ORAL_TABLET | Freq: Every day | ORAL | Status: DC
  Administered 2021-08-17: 10 mg via ORAL
  Filled 2021-08-15: qty 1

## 2021-08-15 MED ORDER — CLOPIDOGREL BISULFATE 75 MG PO TABS
300.0000 mg | ORAL_TABLET | Freq: Once | ORAL | Status: AC
  Administered 2021-08-15: 12:00:00 300 mg via ORAL
  Filled 2021-08-15: qty 4

## 2021-08-15 NOTE — Discharge Summary (Addendum)
Physician Discharge Summary  Michael Chung NFA:[id number] DOB: 1961/09/25 DOA: 08/14/2021  PCP: Joaquim Nam, MD  Admit date: 08/14/2021 Discharge date: 08/17/2021  Admitted From: Home Disposition: Home  Recommendations for Outpatient Follow-up:  Follow up with PCP in 1 week with repeat CBC/BMP Outpatient follow-up with neurology Outpatient follow-up with vascular surgery Follow up in ED if symptoms worsen or new appear   Home Health: No Equipment/Devices: None  Discharge Condition: Stable CODE STATUS: Full Diet recommendation: Heart healthy  Brief/Interim Summary: 60 year old male with history of smoking, hypertension presented with blurred vision in the right eye.  On presentation, CT of the head was negative for any acute intracranial abnormality.  MRI of the brain showed small acute infarct in the right frontal white matter.  MRA of the head was negative for large vessel occlusion.  Neurology was consulted.  Subsequently symptoms have resolved.  He was found to have symptomatic right carotid artery stenosis.  He underwent right carotid artery stenting on 08/16/2021 by vascular surgery.  Vascular surgery has cleared the patient for discharge on aspirin, Plavix and statin.  Neurology has already signed off.  Discharge patient home today.  Discharge Diagnoses:   Acute infarct in the right frontal lobe white matter Right eye blurred vision -2D echo showed EF of 65 to 70% with mild to moderate aortic valve regurgitation -LDL 121.  A1c 5.5 -Neurology evaluation and follow-up appreciated: Continue aspirin lifelong.  Continue Plavix and Lipitor.  Neurology has already signed off.  Outpatient follow-up with neurology -Symptoms have resolved.   Symptomatic right carotid artery stenosis -He underwent right carotid artery stenting on 08/16/2021 by vascular surgery.  Vascular surgery has cleared the patient for discharge on aspirin, Plavix and statin.  -Discharge patient home today.    Hyperlipidemia -Continue statin  Hypertension -continue amlodipine      Discharge Instructions   Allergies as of 08/17/2021       Reactions   Nsaids Other (See Comments)   H/o coffee ground emesis        Medication List     TAKE these medications    amLODipine 10 MG tablet Commonly known as: NORVASC Take 1 tablet (10 mg total) by mouth daily.   aspirin 81 MG EC tablet Take 1 tablet (81 mg total) by mouth daily at 6 (six) AM. Swallow whole. Start taking on: August 18, 2021   atorvastatin 80 MG tablet Commonly known as: LIPITOR Take 1 tablet (80 mg total) by mouth at bedtime.   clopidogrel 75 MG tablet Commonly known as: PLAVIX Take 1 tablet (75 mg total) by mouth daily. Start taking on: August 18, 2021   cyclobenzaprine 10 MG tablet Commonly known as: FLEXERIL TAKE 1 TABLET (10 MG TOTAL) BY MOUTH DAILY AS NEEDED FOR MUSCLE SPASMS (SEDATION CAUTION.).   ibuprofen 800 MG tablet Commonly known as: ADVIL TAKE 1 TABLET BY MOUTH EVERY 8 HOURS AS NEEDED WITH FOOD (IF ANY GI UPSET, STOP MED & TELL MD)   omeprazole 20 MG capsule Commonly known as: PRILOSEC TAKE 1 CAPSULE BY MOUTH EVERY DAY   sildenafil 20 MG tablet Commonly known as: REVATIO Take 1-5 tablets (20-100 mg total) by mouth daily as needed.         Follow-up Information     Joaquim Nam, MD. Schedule an appointment as soon as possible for a visit in 1 week(s).   Specialty: Family Medicine Contact information: [address] [place] [address] [phone]  Lonell Face, MD. Schedule an appointment as soon as possible for a visit in 1 week(s).   Specialty: Neurology Contact information: [phone] [facility] [place] [address] [phone]                Allergies  Allergen Reactions   Nsaids Other (See Comments)    H/o coffee ground emesis    Consultations: Neurology/vascular  surgery   Procedures/Studies: CT HEAD WO CONTRAST ( )  Result Date: 08/14/2021 CLINICAL DATA:  Headache EXAM: CT HEAD WITHOUT CONTRAST TECHNIQUE: Contiguous axial images were obtained from the base of the skull through the vertex without intravenous contrast. COMPARISON:  None. FINDINGS: Brain: No acute infarct or hemorrhage. Lateral ventricles and midline structures are unremarkable. No acute extra-axial fluid collections. No mass effect. Vascular: No hyperdense vessel or unexpected calcification. Skull: Normal. Negative for fracture or focal lesion. Sinuses/Orbits: Polypoid mucosal thickening within the right sphenoid sinus. Remaining paranasal sinuses are clear. Other: None. IMPRESSION: 1. No acute intracranial process. 2. Right sphenoid sinus disease. Electronically Signed   By: Sharlet Salina M.D.   On: 08/14/2021 15:27   MR ANGIO HEAD WO CONTRAST  Result Date: 08/14/2021 CLINICAL DATA:  Neuro deficit, acute, stroke suspected EXAM: MRI HEAD WITHOUT CONTRAST MRA HEAD WITHOUT CONTRAST TECHNIQUE: Multiplanar, multi-echo pulse sequences of the brain and surrounding structures were acquired without intravenous contrast. Angiographic images of the Circle of Willis were acquired using MRA technique without intravenous contrast. COMPARISON:  Same day CT head. FINDINGS: MRI HEAD FINDINGS Brain: Small acute infarct in the right frontal white matter. Slight edema without mass effect. Otherwise, mild T2 hyperintensity within the white matter, nonspecific but compatible with chronic microvascular ischemic disease. Vascular: See below. Skull and upper cervical spine: Normal marrow signal. Sinuses/Orbits: Right sphenoid sinus mucosal thickening with air-fluid level. Otherwise, clear sinuses. Unremarkable orbits. Other: Trace mastoid effusions. MRA HEAD FINDINGS Motion limited evaluation.  Within this limitation: Anterior circulation: Bilateral intracranial ICAs are patent with mild paraclinoid ICA stenosis.  Bilateral MCAs are patent without high-grade proximal stenosis. No proximal M2 branch occlusion. Limited distal evaluation due to motion. Bilateral A1 ACAs and A2 ACAs are patent proximally. Posterior circulation: Bilateral intradural vertebral arteries, basilar artery, and posterior cerebral arteries are patent without proximal high-grade stenosis. Bilateral posterior communicating arteries. No aneurysm identified. Anatomic variants: See above. IMPRESSION: MRI: 1. Small acute infarct in the right frontal white matter. Slight edema without mass effect. 2. Mild chronic microvascular ischemic disease. MRA: 1. No large vessel occlusion or proximal high-grade stenosis. 2. Mild bilateral ICA stenosis. Electronically Signed   By: Feliberto Harts M.D.   On: 08/14/2021 17:50   MR Brain Wo Contrast (neuro protocol)  Result Date: 08/14/2021 CLINICAL DATA:  Neuro deficit, acute, stroke suspected EXAM: MRI HEAD WITHOUT CONTRAST MRA HEAD WITHOUT CONTRAST TECHNIQUE: Multiplanar, multi-echo pulse sequences of the brain and surrounding structures were acquired without intravenous contrast. Angiographic images of the Circle of Willis were acquired using MRA technique without intravenous contrast. COMPARISON:  Same day CT head. FINDINGS: MRI HEAD FINDINGS Brain: Small acute infarct in the right frontal white matter. Slight edema without mass effect. Otherwise, mild T2 hyperintensity within the white matter, nonspecific but compatible with chronic microvascular ischemic disease. Vascular: See below. Skull and upper cervical spine: Normal marrow signal. Sinuses/Orbits: Right sphenoid sinus mucosal thickening with air-fluid level. Otherwise, clear sinuses. Unremarkable orbits. Other: Trace mastoid effusions. MRA HEAD FINDINGS Motion limited evaluation.  Within this limitation: Anterior circulation: Bilateral intracranial ICAs are  patent with mild paraclinoid ICA stenosis. Bilateral MCAs are patent without high-grade proximal  stenosis. No proximal M2 branch occlusion. Limited distal evaluation due to motion. Bilateral A1 ACAs and A2 ACAs are patent proximally. Posterior circulation: Bilateral intradural vertebral arteries, basilar artery, and posterior cerebral arteries are patent without proximal high-grade stenosis. Bilateral posterior communicating arteries. No aneurysm identified. Anatomic variants: See above. IMPRESSION: MRI: 1. Small acute infarct in the right frontal white matter. Slight edema without mass effect. 2. Mild chronic microvascular ischemic disease. MRA: 1. No large vessel occlusion or proximal high-grade stenosis. 2. Mild bilateral ICA stenosis. Electronically Signed   By: Feliberto HartsFrederick S Jones M.D.   On: 08/14/2021 17:50      Subjective: Patient seen and examined at bedside.  Denies any new weakness, nausea, vomiting or blurred vision.  Feels okay to go home today. Discharge Exam: Vitals:   08/15/21 0840 08/15/21 0856  BP: (!) 141/80 122/79  Pulse:  67  Resp:  18  Temp:  98.1 F (36.7 C)  SpO2:  97%    General: Pt is alert, awake, not in acute distress.  Moving extremities.  No weakness in extremities. Cardiovascular: rate controlled, S1/S2 + Respiratory: bilateral decreased breath sounds at bases Abdominal: Soft, NT, ND, bowel sounds + Extremities: no edema, no cyanosis    The results of significant diagnostics from this hospitalization (including imaging, microbiology, ancillary and laboratory) are listed below for reference.     Microbiology: Recent Results (from the past 240 hour(s))  SARS CORONAVIRUS 2 (TAT 6-24 HRS) Nasopharyngeal Nasopharyngeal Swab     Status: None   Collection Time: 08/14/21  8:07 PM   Specimen: Nasopharyngeal Swab  Result Value Ref Range Status   SARS Coronavirus 2 NEGATIVE NEGATIVE Final    Comment: (NOTE) SARS-CoV-2 target nucleic acids are NOT DETECTED.  The SARS-CoV-2 RNA is generally detectable in upper and lower respiratory specimens during the acute  phase of infection. Negative results do not preclude SARS-CoV-2 infection, do not rule out co-infections with other pathogens, and should not be used as the sole basis for treatment or other patient management decisions. Negative results must be combined with clinical observations, patient history, and epidemiological information. The expected result is Negative.  Fact Sheet for Patients: HairSlick.nohttps://www.fda.gov/media/138098/download  Fact Sheet for Healthcare Providers: quierodirigir.comhttps://www.fda.gov/media/138095/download  This test is not yet approved or cleared by the [place] FDA and  has been authorized for detection and/or diagnosis of SARS-CoV-2 by FDA under an Emergency Use Authorization (EUA). This EUA will remain  in effect (meaning this test can be used) for the duration of the COVID-19 declaration under Se ction 564(b)(1) of the Act, 21 U.S.C. section 360bbb-3(b)(1), unless the authorization is terminated or revoked sooner.  Performed at [facility], [address] [place], [place] [address]      Labs: BNP (last 3 results) No results for input(s): BNP in the last 8760 hours. Basic Metabolic Panel: Recent Labs  Lab 08/14/21 1454 08/14/21 2338 08/15/21 0457  NA 141  --  141  K 3.3*  --  3.9  CL 104  --  107  CO2 31  --  29  GLUCOSE 110*  --  110*  BUN 13  --  18  CREATININE 0.85 0.84 0.92  CALCIUM 9.5  --  8.9   Liver Function Tests: Recent Labs  Lab 08/14/21 1454  AST 20  ALT 13  ALKPHOS 60  BILITOT 0.7  PROT 8.2*  ALBUMIN 4.6   No results for  input(s): LIPASE, AMYLASE in the last 168 hours. No results for input(s): AMMONIA in the last 168 hours. CBC: Recent Labs  Lab 08/14/21 1454 08/14/21 2338 08/15/21 0457  WBC 7.0 5.8 4.9  NEUTROABS 4.7  --   --   HGB 15.4 13.9 14.0  HCT 45.9 40.3 41.3  MCV 91.8 91.2 91.2  PLT 239 207 220   Cardiac Enzymes: No results for input(s): CKTOTAL, CKMB, CKMBINDEX, TROPONINI in the last 168  hours. BNP: Invalid input(s): POCBNP CBG: No results for input(s): GLUCAP in the last 168 hours. D-Dimer No results for input(s): DDIMER in the last 72 hours. Hgb A1c Recent Labs    08/15/21 0457  HGBA1C 5.5   Lipid Profile Recent Labs    08/15/21 0457  CHOL 179  HDL 29*  LDLCALC 121*  TRIG 144  CHOLHDL 6.2   Thyroid function studies Recent Labs    08/14/21 1454  TSH 0.401   Anemia work up No results for input(s): VITAMINB12, FOLATE, FERRITIN, TIBC, IRON, RETICCTPCT in the last 72 hours. Urinalysis    Component Value Date/Time   COLORURINE YELLOW (A) 11/16/2015 1516   APPEARANCEUR CLEAR (A) 11/16/2015 1516   LABSPEC 1.021 11/16/2015 1516   PHURINE 5.0 11/16/2015 1516   GLUCOSEU NEGATIVE 11/16/2015 1516   HGBUR NEGATIVE 11/16/2015 1516   BILIRUBINUR NEGATIVE 11/16/2015 1516   BILIRUBINUR Neg 11/07/2013 1304   KETONESUR NEGATIVE 11/16/2015 1516   PROTEINUR NEGATIVE 11/16/2015 1516   UROBILINOGEN negative 11/07/2013 1304   NITRITE NEGATIVE 11/16/2015 1516   LEUKOCYTESUR NEGATIVE 11/16/2015 1516   Sepsis Labs Invalid input(s): PROCALCITONIN,  WBC,  LACTICIDVEN Microbiology Recent Results (from the past 240 hour(s))  SARS CORONAVIRUS 2 (TAT 6-24 HRS) Nasopharyngeal Nasopharyngeal Swab     Status: None   Collection Time: 08/14/21  8:07 PM   Specimen: Nasopharyngeal Swab  Result Value Ref Range Status   SARS Coronavirus 2 NEGATIVE NEGATIVE Final    Comment: (NOTE) SARS-CoV-2 target nucleic acids are NOT DETECTED.  The SARS-CoV-2 RNA is generally detectable in upper and lower respiratory specimens during the acute phase of infection. Negative results do not preclude SARS-CoV-2 infection, do not rule out co-infections with other pathogens, and should not be used as the sole basis for treatment or other patient management decisions. Negative results must be combined with clinical observations, patient history, and epidemiological information. The  expected result is Negative.  Fact Sheet for Patients: HairSlick.no  Fact Sheet for Healthcare Providers: quierodirigir.com  This test is not yet approved or cleared by the [place] FDA and  has been authorized for detection and/or diagnosis of SARS-CoV-2 by FDA under an Emergency Use Authorization (EUA). This EUA will remain  in effect (meaning this test can be used) for the duration of the COVID-19 declaration under Se ction 564(b)(1) of the Act, 21 U.S.C. section 360bbb-3(b)(1), unless the authorization is terminated or revoked sooner.  Performed at [facility], [address] [place], [place] [address]      Time coordinating discharge: 35 minutes  SIGNED:   Glade Lloyd, MD  [facility] 08/17/2021 10:13am

## 2021-08-15 NOTE — Consult Note (Signed)
Reason for Consult:TIA right carotid artery stenosis with ulcerated plaque Referring Physician: Alekh, Kshitiz, MD  Michael Chung is an 60 y.o. male.  HPI: This is a 60 year old male with recent sudden right eye blurred vision and MRI suggestive of small acute right frontal lobe. He has history of smoking, HTN. CT of head was negative for acute intracranial abnormalities.   Past Medical History:  Diagnosis Date   Arthritis    Coffee ground emesis    2016 after nsaid use   Dupuytren contracture 06/2013   right long, ring, small fingers   GERD (gastroesophageal reflux disease)    uses OTC as needed    Past Surgical History:  Procedure Laterality Date   DUPUYTREN CONTRACTURE RELEASE Right 06/27/2013   Procedure: EXCISION DUPUYTRENS RIGHT LONG AND RING  FINGERS;  Surgeon: Robert V Sypher Jr., MD;  Location: [facility];  Service: Orthopedics;  Laterality: Right;   TOTAL HIP ARTHROPLASTY Left 11/25/2015   Procedure: TOTAL HIP ARTHROPLASTY;  Surgeon: [place] Miller, MD;  Location: [facility];  Service: Orthopedics;  Laterality: Left;   TOTAL HIP ARTHROPLASTY Right    2020    Family History  Problem Relation Age of Onset   Hypertension Father    Diabetes Father    Heart Problems Father    Diabetes Mother    Hypertension Mother    Dementia Mother    Colon cancer Brother    Colon cancer Brother    Prostate cancer Neg Hx     Social History:  reports that he has been smoking cigarettes. He has a 20.00 pack-year smoking history. He has never used smokeless tobacco. He reports that he does not drink alcohol and does not use drugs.  Allergies:  Allergies  Allergen Reactions   Nsaids Other (See Comments)    H/o coffee ground emesis    Medications: I have reviewed the patient's current medications.  Results for orders placed or performed during the hospital encounter of 08/14/21 (from the past 48 hour(s))  CBC with Differential     Status: None   Collection Time:  08/14/21  2:54 PM  Result Value Ref Range   WBC 7.0 4.0 - 10.5 K/uL   RBC 5.00 4.22 - 5.81 MIL/uL   Hemoglobin 15.4 13.0 - 17.0 g/dL   HCT 45.9 39.0 - 52.0 %   MCV 91.8 80.0 - 100.0 fL   MCH 30.8 26.0 - 34.0 pg   MCHC 33.6 30.0 - 36.0 g/dL   RDW 13.0 11.5 - 15.5 %   Platelets 239 150 - 400 K/uL   nRBC 0.0 0.0 - 0.2 %   Neutrophils Relative % 68 %   Neutro Abs 4.7 1.7 - 7.7 K/uL   Lymphocytes Relative 22 %   Lymphs Abs 1.6 0.7 - 4.0 K/uL   Monocytes Relative 9 %   Monocytes Absolute 0.6 0.1 - 1.0 K/uL   Eosinophils Relative 1 %   Eosinophils Absolute 0.1 0.0 - 0.5 K/uL   Basophils Relative 0 %   Basophils Absolute 0.0 0.0 - 0.1 K/uL   Immature Granulocytes 0 %   Abs Immature Granulocytes 0.02 0.00 - 0.07 K/uL    Comment: Performed at [facility], [address] [place], [place] [address]  Comprehensive metabolic panel     Status: Abnormal   Collection Time: 08/14/21  2:54 PM  Result Value Ref Range   Sodium 141 135 - 145 mmol/L   Potassium 3.3 (L) 3.5 - 5.1 mmol/L     Chloride 104 98 - 111 mmol/L   CO2 31 22 - 32 mmol/L   Glucose, Bld 110 (H) 70 - 99 mg/dL    Comment: Glucose reference range applies only to samples taken after fasting for at least 8 hours.   BUN 13 6 - 20 mg/dL   Creatinine, Ser 0.85 0.61 - 1.24 mg/dL   Calcium 9.5 8.9 - 10.3 mg/dL   Total Protein 8.2 (H) 6.5 - 8.1 g/dL   Albumin 4.6 3.5 - 5.0 g/dL   AST 20 15 - 41 U/L   ALT 13 0 - 44 U/L   Alkaline Phosphatase 60 38 - 126 U/L   Total Bilirubin 0.7 0.3 - 1.2 mg/dL   GFR, Estimated >60 >60 mL/min    Comment: (NOTE) Calculated using the CKD-EPI Creatinine Equation (2021)    Anion gap 6 5 - 15    Comment: Performed at [facility], [address] [place], [place] [address]  Protime-INR     Status: None   Collection Time: 08/14/21  2:54 PM  Result Value Ref Range   Prothrombin Time 13.5 11.4 - 15.2 seconds   INR 1.0 0.8 - 1.2    Comment: (NOTE) INR goal varies based on device and  disease states. Performed at [facility], [address] [place], [place] [address]   APTT     Status: None   Collection Time: 08/14/21  2:54 PM  Result Value Ref Range   aPTT 25 24 - 36 seconds    Comment: Performed at [facility], [address] [place], [place] [address]  TSH     Status: None   Collection Time: 08/14/21  2:54 PM  Result Value Ref Range   TSH 0.401 0.350 - 4.500 uIU/mL    Comment: Performed by a 3rd Generation assay with a functional sensitivity of <=0.01 uIU/mL. Performed at [facility], [address] [place], [place] [address]   SARS CORONAVIRUS 2 (TAT 6-24 HRS) Nasopharyngeal Nasopharyngeal Swab     Status: None   Collection Time: 08/14/21  8:07 PM   Specimen: Nasopharyngeal Swab  Result Value Ref Range   SARS Coronavirus 2 NEGATIVE NEGATIVE    Comment: (NOTE) SARS-CoV-2 target nucleic acids are NOT DETECTED.  The SARS-CoV-2 RNA is generally detectable in upper and lower respiratory specimens during the acute phase of infection. Negative results do not preclude SARS-CoV-2 infection, do not rule out co-infections with other pathogens, and should not be used as the sole basis for treatment or other patient management decisions. Negative results must be combined with clinical observations, patient history, and epidemiological information. The expected result is Negative.  Fact Sheet for Patients: https://www.fda.gov/media/138098/download  Fact Sheet for Healthcare Providers: https://www.fda.gov/media/138095/download  This test is not yet approved or cleared by the [place] FDA and  has been authorized for detection and/or diagnosis of SARS-CoV-2 by FDA under an Emergency Use Authorization (EUA). This EUA will remain  in effect (meaning this test can be used) for the duration of the COVID-19 declaration under Se ction 564(b)(1) of the Act, 21 U.S.C. section 360bbb-3(b)(1), unless the authorization is terminated  or revoked sooner.  Performed at  Hospital Lab, [address] [place], [place] [address]   CBC     Status: None   Collection Time: 08/14/21 11:38 PM  Result Value Ref Range   WBC 5.8 4.0 - 10.5 K/uL   RBC 4.42 4.22 - 5.81 MIL/uL   Hemoglobin 13.9 13.0 - 17.0 g/dL     HCT 40.3 39.0 - 52.0 %   MCV 91.2 80.0 - 100.0 fL   MCH 31.4 26.0 - 34.0 pg   MCHC 34.5 30.0 - 36.0 g/dL   RDW 13.0 11.5 - 15.5 %   Platelets 207 150 - 400 K/uL   nRBC 0.0 0.0 - 0.2 %    Comment: Performed at [facility], [address] [place], [place] [address]  Creatinine, serum     Status: None   Collection Time: 08/14/21 11:38 PM  Result Value Ref Range   Creatinine, Ser 0.84 0.61 - 1.24 mg/dL   GFR, Estimated >60 >60 mL/min    Comment: (NOTE) Calculated using the CKD-EPI Creatinine Equation (2021) Performed at [facility], [address] [place], [place] [address]   Hemoglobin A1c     Status: None   Collection Time: 08/15/21  4:57 AM  Result Value Ref Range   Hgb A1c MFr Bld 5.5 4.8 - 5.6 %    Comment: (NOTE) Pre diabetes:          5.7%-6.4%  Diabetes:              >6.4%  Glycemic control for   <7.0% adults with diabetes    Mean Plasma Glucose 111.15 mg/dL    Comment: Performed at [facility], [address] [place], [place] [address]  Lipid panel     Status: Abnormal   Collection Time: 08/15/21  4:57 AM  Result Value Ref Range   Cholesterol 179 0 - 200 mg/dL   Triglycerides 144 <150 mg/dL   HDL 29 (L) >40 mg/dL   Total CHOL/HDL Ratio 6.2 RATIO   VLDL 29 0 - 40 mg/dL   LDL Cholesterol 121 (H) 0 - 99 mg/dL    Comment:        Total Cholesterol/HDL:CHD Risk Coronary Heart Disease Risk Table                     Men   Women  1/2 Average Risk   3.4   3.3  Average Risk       5.0   4.4  2 X Average Risk   9.6   7.1  3 X Average Risk  23.4   11.0        Use the calculated Patient Ratio above and the CHD Risk Table to determine the patient's CHD Risk.         ATP III CLASSIFICATION (LDL):  <100     mg/dL   Optimal  100-129  mg/dL   Near or Above                    Optimal  130-159  mg/dL   Borderline  160-189  mg/dL   High  >190     mg/dL   Very High Performed at [facility], [address] [place], [place] [address]   CBC     Status: None   Collection Time: 08/15/21  4:57 AM  Result Value Ref Range   WBC 4.9 4.0 - 10.5 K/uL   RBC 4.53 4.22 - 5.81 MIL/uL   Hemoglobin 14.0 13.0 - 17.0 g/dL   HCT 41.3 39.0 - 52.0 %   MCV 91.2 80.0 - 100.0 fL   MCH 30.9 26.0 - 34.0 pg   MCHC 33.9 30.0 - 36.0 g/dL   RDW 13.0 11.5 - 15.5 %   Platelets 220 150 - 400 K/uL   nRBC 0.0 0.0 -   0.2 %    Comment: Performed at [facility], [address] [place], [place] [address]  Basic metabolic panel     Status: Abnormal   Collection Time: 08/15/21  4:57 AM  Result Value Ref Range   Sodium 141 135 - 145 mmol/L   Potassium 3.9 3.5 - 5.1 mmol/L   Chloride 107 98 - 111 mmol/L   CO2 29 22 - 32 mmol/L   Glucose, Bld 110 (H) 70 - 99 mg/dL    Comment: Glucose reference range applies only to samples taken after fasting for at least 8 hours.   BUN 18 6 - 20 mg/dL   Creatinine, Ser 0.92 0.61 - 1.24 mg/dL   Calcium 8.9 8.9 - 10.3 mg/dL   GFR, Estimated >60 >60 mL/min    Comment: (NOTE) Calculated using the CKD-EPI Creatinine Equation (2021)    Anion gap 5 5 - 15    Comment: Performed at [facility], [address] [place], [place] [address]  HIV Antibody (routine testing w rflx)     Status: None   Collection Time: 08/15/21  4:57 AM  Result Value Ref Range   HIV Screen 4th Generation wRfx Non Reactive Non Reactive    Comment: Performed at [facility], [address] [place], [place] [address]  Glucose, capillary     Status: Abnormal   Collection Time: 08/15/21 12:05 PM  Result Value Ref Range   Glucose-Capillary 109 (H) 70 - 99 mg/dL    Comment: Glucose reference range applies only to samples taken after fasting for  at least 8 hours.    CT ANGIO HEAD W OR WO CONTRAST  Result Date: 08/15/2021 CLINICAL DATA:  59-year-old male with neurologic deficit. Small right frontal lobe white matter lacunar infarct on brain MRI yesterday. EXAM: CT ANGIOGRAPHY HEAD AND NECK TECHNIQUE: Multidetector CT imaging of the head and neck was performed using the standard protocol during bolus administration of intravenous contrast. Multiplanar CT image reconstructions and MIPs were obtained to evaluate the vascular anatomy. Carotid stenosis measurements (when applicable) are obtained utilizing NASCET criteria, using the distal internal carotid diameter as the denominator. CONTRAST:  75mL OMNIPAQUE IOHEXOL 350 MG/ML SOLN COMPARISON:  Brain MRI and intracranial MRA 08/14/2021. FINDINGS: CTA NECK Skeleton: No acute osseous abnormality identified. Mild for age degeneration in the spine. Upper chest: Centrilobular and paraseptal emphysema, moderate. Other neck: Negative, no neck mass or lymphadenopathy. Aortic arch: 3 vessel arch configuration. Mild arch atherosclerosis. Right carotid system: Negative brachiocephalic artery. Soft plaque in the right CCA anteriorly but no stenosis before the bifurcation. Complex moderate soft and calcified plaque at the right ICA origin and bulb results in stenosis up to 60 % with respect to the distal vessel at the distal bulb (series 4, image 109). Proximal that the plaque is somewhat ulcerated (series 7, image 125). The right ICA remains patent to the skull base. Left carotid system: Left CCA soft and calcified plaque which continues to the left carotid bifurcation. Left ICA origin and distal left ICA involvement just below the skull base but less than 50 % stenosis with respect to the distal vessel. Vertebral arteries: Proximal right subclavian atherosclerosis without stenosis. Calcified plaque near the right vertebral artery origin but no origin stenosis. Tortuous right V1 segment. The right V2 segment is also  tortuous and the right vertebral is patent to the skull base without stenosis. Soft and calcified plaque in the proximal left subclavian artery without stenosis. Non dominant left vertebral artery has   a normal origin. Tortuous left vertebral remains non dominant and is patent to the skull base without stenosis. CTA HEAD Posterior circulation: Dominant right V4 segment. No significant distal vertebral plaque. No distal vertebral stenosis. Normal left PICA origin. Dominant right AICA is patent. Patent vertebrobasilar junction and basilar artery without stenosis. Patent SCA and PCA origins with bilateral posterior communicating arteries. Bilateral PCA branches are within normal limits. Anterior circulation: Both ICA siphons are patent but heavily calcified. There is both soft and calcified plaque of the left ICA siphon (soft plaque in the petrous segment) with moderate left cavernous ICA stenosis (series 6, image 106). Normal left ophthalmic and posterior communicating arteries. Similar right ICA siphon plaque and moderate to severe stenosis at the anterior genu (series 8, image 66) where decreased flow signal is noted on the MRA yesterday. Right ophthalmic and posterior communicating artery origins remain normal. Both carotid termini remain patent. Patent MCA and ACA origins with dominant left A1 segment. Anterior communicating artery and bilateral ACA branches are within normal limits. Left MCA M1 segment and trifurcation are patent without stenosis. Left MCA branches are within normal limits. Right MCA M1 segment and bifurcation are patent without stenosis. Right MCA branches are within normal limits. Venous sinuses: Patent. The right transverse and sigmoid sinus appear dominant. Anatomic variants: Dominant right vertebral artery. Review of the MIP images confirms the above findings IMPRESSION: 1. Negative for large vessel occlusion. 2. Positive for widespread atherosclerosis in the head and neck: Heavily calcified  ICA siphons with moderate to severe Right ICA anterior genu stenosis. Moderate Left cavernous ICA stenosis. Complex plaque at the Right ICA bulb is partially ulcerated and results in up to 60% stenosis. 3. But no significant vertebral artery or posterior circulation stenosis. 4. Aortic Atherosclerosis (ICD10-I70.0) and Emphysema (ICD10-J43.9). Electronically Signed   By: H  Hall M.D.   On: 08/15/2021 10:59   CT HEAD WO CONTRAST (5MM)  Result Date: 08/14/2021 CLINICAL DATA:  Headache EXAM: CT HEAD WITHOUT CONTRAST TECHNIQUE: Contiguous axial images were obtained from the base of the skull through the vertex without intravenous contrast. COMPARISON:  None. FINDINGS: Brain: No acute infarct or hemorrhage. Lateral ventricles and midline structures are unremarkable. No acute extra-axial fluid collections. No mass effect. Vascular: No hyperdense vessel or unexpected calcification. Skull: Normal. Negative for fracture or focal lesion. Sinuses/Orbits: Polypoid mucosal thickening within the right sphenoid sinus. Remaining paranasal sinuses are clear. Other: None. IMPRESSION: 1. No acute intracranial process. 2. Right sphenoid sinus disease. Electronically Signed   By: Michael  Brown M.D.   On: 08/14/2021 15:27   CT ANGIO NECK W OR WO CONTRAST  Result Date: 08/15/2021 CLINICAL DATA:  59-year-old male with neurologic deficit. Small right frontal lobe white matter lacunar infarct on brain MRI yesterday. EXAM: CT ANGIOGRAPHY HEAD AND NECK TECHNIQUE: Multidetector CT imaging of the head and neck was performed using the standard protocol during bolus administration of intravenous contrast. Multiplanar CT image reconstructions and MIPs were obtained to evaluate the vascular anatomy. Carotid stenosis measurements (when applicable) are obtained utilizing NASCET criteria, using the distal internal carotid diameter as the denominator. CONTRAST:  75mL OMNIPAQUE IOHEXOL 350 MG/ML SOLN COMPARISON:  Brain MRI and intracranial MRA  08/14/2021. FINDINGS: CTA NECK Skeleton: No acute osseous abnormality identified. Mild for age degeneration in the spine. Upper chest: Centrilobular and paraseptal emphysema, moderate. Other neck: Negative, no neck mass or lymphadenopathy. Aortic arch: 3 vessel arch configuration. Mild arch atherosclerosis. Right carotid system: Negative brachiocephalic artery. Soft plaque in the right   CCA anteriorly but no stenosis before the bifurcation. Complex moderate soft and calcified plaque at the right ICA origin and bulb results in stenosis up to 60 % with respect to the distal vessel at the distal bulb (series 4, image 109). Proximal that the plaque is somewhat ulcerated (series 7, image 125). The right ICA remains patent to the skull base. Left carotid system: Left CCA soft and calcified plaque which continues to the left carotid bifurcation. Left ICA origin and distal left ICA involvement just below the skull base but less than 50 % stenosis with respect to the distal vessel. Vertebral arteries: Proximal right subclavian atherosclerosis without stenosis. Calcified plaque near the right vertebral artery origin but no origin stenosis. Tortuous right V1 segment. The right V2 segment is also tortuous and the right vertebral is patent to the skull base without stenosis. Soft and calcified plaque in the proximal left subclavian artery without stenosis. Non dominant left vertebral artery has a normal origin. Tortuous left vertebral remains non dominant and is patent to the skull base without stenosis. CTA HEAD Posterior circulation: Dominant right V4 segment. No significant distal vertebral plaque. No distal vertebral stenosis. Normal left PICA origin. Dominant right AICA is patent. Patent vertebrobasilar junction and basilar artery without stenosis. Patent SCA and PCA origins with bilateral posterior communicating arteries. Bilateral PCA branches are within normal limits. Anterior circulation: Both ICA siphons are patent but  heavily calcified. There is both soft and calcified plaque of the left ICA siphon (soft plaque in the petrous segment) with moderate left cavernous ICA stenosis (series 6, image 106). Normal left ophthalmic and posterior communicating arteries. Similar right ICA siphon plaque and moderate to severe stenosis at the anterior genu (series 8, image 66) where decreased flow signal is noted on the MRA yesterday. Right ophthalmic and posterior communicating artery origins remain normal. Both carotid termini remain patent. Patent MCA and ACA origins with dominant left A1 segment. Anterior communicating artery and bilateral ACA branches are within normal limits. Left MCA M1 segment and trifurcation are patent without stenosis. Left MCA branches are within normal limits. Right MCA M1 segment and bifurcation are patent without stenosis. Right MCA branches are within normal limits. Venous sinuses: Patent. The right transverse and sigmoid sinus appear dominant. Anatomic variants: Dominant right vertebral artery. Review of the MIP images confirms the above findings IMPRESSION: 1. Negative for large vessel occlusion. 2. Positive for widespread atherosclerosis in the head and neck: Heavily calcified ICA siphons with moderate to severe Right ICA anterior genu stenosis. Moderate Left cavernous ICA stenosis. Complex plaque at the Right ICA bulb is partially ulcerated and results in up to 60% stenosis. 3. But no significant vertebral artery or posterior circulation stenosis. 4. Aortic Atherosclerosis (ICD10-I70.0) and Emphysema (ICD10-J43.9). Electronically Signed   By: H  Hall M.D.   On: 08/15/2021 10:59   MR ANGIO HEAD WO CONTRAST  Result Date: 08/14/2021 CLINICAL DATA:  Neuro deficit, acute, stroke suspected EXAM: MRI HEAD WITHOUT CONTRAST MRA HEAD WITHOUT CONTRAST TECHNIQUE: Multiplanar, multi-echo pulse sequences of the brain and surrounding structures were acquired without intravenous contrast. Angiographic images of the  Circle of Willis were acquired using MRA technique without intravenous contrast. COMPARISON:  Same day CT head. FINDINGS: MRI HEAD FINDINGS Brain: Small acute infarct in the right frontal white matter. Slight edema without mass effect. Otherwise, mild T2 hyperintensity within the white matter, nonspecific but compatible with chronic microvascular ischemic disease. Vascular: See below. Skull and upper cervical spine: Normal marrow signal. Sinuses/Orbits: Right sphenoid   sinus mucosal thickening with air-fluid level. Otherwise, clear sinuses. Unremarkable orbits. Other: Trace mastoid effusions. MRA HEAD FINDINGS Motion limited evaluation.  Within this limitation: Anterior circulation: Bilateral intracranial ICAs are patent with mild paraclinoid ICA stenosis. Bilateral MCAs are patent without high-grade proximal stenosis. No proximal M2 branch occlusion. Limited distal evaluation due to motion. Bilateral A1 ACAs and A2 ACAs are patent proximally. Posterior circulation: Bilateral intradural vertebral arteries, basilar artery, and posterior cerebral arteries are patent without proximal high-grade stenosis. Bilateral posterior communicating arteries. No aneurysm identified. Anatomic variants: See above. IMPRESSION: MRI: 1. Small acute infarct in the right frontal white matter. Slight edema without mass effect. 2. Mild chronic microvascular ischemic disease. MRA: 1. No large vessel occlusion or proximal high-grade stenosis. 2. Mild bilateral ICA stenosis. Electronically Signed   By: Frederick S Jones M.D.   On: 08/14/2021 17:50   MR Brain Wo Contrast (neuro protocol)  Result Date: 08/14/2021 CLINICAL DATA:  Neuro deficit, acute, stroke suspected EXAM: MRI HEAD WITHOUT CONTRAST MRA HEAD WITHOUT CONTRAST TECHNIQUE: Multiplanar, multi-echo pulse sequences of the brain and surrounding structures were acquired without intravenous contrast. Angiographic images of the Circle of Willis were acquired using MRA technique without  intravenous contrast. COMPARISON:  Same day CT head. FINDINGS: MRI HEAD FINDINGS Brain: Small acute infarct in the right frontal white matter. Slight edema without mass effect. Otherwise, mild T2 hyperintensity within the white matter, nonspecific but compatible with chronic microvascular ischemic disease. Vascular: See below. Skull and upper cervical spine: Normal marrow signal. Sinuses/Orbits: Right sphenoid sinus mucosal thickening with air-fluid level. Otherwise, clear sinuses. Unremarkable orbits. Other: Trace mastoid effusions. MRA HEAD FINDINGS Motion limited evaluation.  Within this limitation: Anterior circulation: Bilateral intracranial ICAs are patent with mild paraclinoid ICA stenosis. Bilateral MCAs are patent without high-grade proximal stenosis. No proximal M2 branch occlusion. Limited distal evaluation due to motion. Bilateral A1 ACAs and A2 ACAs are patent proximally. Posterior circulation: Bilateral intradural vertebral arteries, basilar artery, and posterior cerebral arteries are patent without proximal high-grade stenosis. Bilateral posterior communicating arteries. No aneurysm identified. Anatomic variants: See above. IMPRESSION: MRI: 1. Small acute infarct in the right frontal white matter. Slight edema without mass effect. 2. Mild chronic microvascular ischemic disease. MRA: 1. No large vessel occlusion or proximal high-grade stenosis. 2. Mild bilateral ICA stenosis. Electronically Signed   By: Frederick S Jones M.D.   On: 08/14/2021 17:50    Review of Systems Blood pressure 139/85, pulse 64, temperature 97.6 F (36.4 C), temperature source Oral, resp. rate 18, height 6' 1" (1.854 m), weight 98.1 kg, SpO2 91 %. Physical Exam  Alert and Oriented x 3  Constitutional: Appears well-developed and well-nourished.  Psych: Affect appropriate to situation, calm, cooperative, jokes with examiner Eyes: No scleral injection HENT: No oropharyngeal obstruction.  MSK: no joint deformities.   Cardiovascular: Normal rate and regular rhythm.  Respiratory: Effort normal, non-labored breathing GI: Soft.  No distension. There is no tenderness.  Skin: Warm dry and intact visible skin Neurologically:  No deficits noted bilaterally in upper or lower extremities.     Assessment/Plan: Symptomatic right carotid artery stenosis He will be scheduled for carotid angiography possible carotid artery stenting.  Continue with antiplatelet aggregation and Xarelto 2.5 mg po BID.   Melody Cirrincione 08/15/2021, 2:11 PM      

## 2021-08-15 NOTE — H&P (View-Only) (Signed)
Reason for Consult:TIA right carotid artery stenosis with ulcerated plaque Referring Physician: Glade LloydAlekh, Kshitiz, MD  Michael Chung is an 60 y.o. male.  HPI: This is a 60 year old male with recent sudden right eye blurred vision and MRI suggestive of small acute right frontal lobe. He has history of smoking, HTN. CT of head was negative for acute intracranial abnormalities.   Past Medical History:  Diagnosis Date   Arthritis    Coffee ground emesis    2016 after nsaid use   Dupuytren contracture 06/2013   right long, ring, small fingers   GERD (gastroesophageal reflux disease)    uses OTC as needed    Past Surgical History:  Procedure Laterality Date   DUPUYTREN CONTRACTURE RELEASE Right 06/27/2013   Procedure: EXCISION DUPUYTRENS RIGHT LONG AND RING  FINGERS;  Surgeon: Wyn Forsterobert V Sypher Jr., MD;  Location: [facility];  Service: Orthopedics;  Laterality: Right;   TOTAL HIP ARTHROPLASTY Left 11/25/2015   Procedure: TOTAL HIP ARTHROPLASTY;  Surgeon: Deeann SaintHoward Miller, MD;  Location: [facility];  Service: Orthopedics;  Laterality: Left;   TOTAL HIP ARTHROPLASTY Right    2020    Family History  Problem Relation Age of Onset   Hypertension Father    Diabetes Father    Heart Problems Father    Diabetes Mother    Hypertension Mother    Dementia Mother    Colon cancer Brother    Colon cancer Brother    Prostate cancer Neg Hx     Social History:  reports that he has been smoking cigarettes. He has a 20.00 pack-year smoking history. He has never used smokeless tobacco. He reports that he does not drink alcohol and does not use drugs.  Allergies:  Allergies  Allergen Reactions   Nsaids Other (See Comments)    H/o coffee ground emesis    Medications: I have reviewed the patient's current medications.  Results for orders placed or performed during the hospital encounter of 08/14/21 (from the past 48 hour(s))  CBC with Differential     Status: None   Collection Time:  08/14/21  2:54 PM  Result Value Ref Range   WBC 7.0 4.0 - 10.5 K/uL   RBC 5.00 4.22 - 5.81 MIL/uL   Hemoglobin 15.4 13.0 - 17.0 g/dL   HCT 16.145.9 09.639.0 - 04.552.0 %   MCV 91.8 80.0 - 100.0 fL   MCH 30.8 26.0 - 34.0 pg   MCHC 33.6 30.0 - 36.0 g/dL   RDW 40.913.0 81.111.5 - 91.415.5 %   Platelets 239 150 - 400 K/uL   nRBC 0.0 0.0 - 0.2 %   Neutrophils Relative % 68 %   Neutro Abs 4.7 1.7 - 7.7 K/uL   Lymphocytes Relative 22 %   Lymphs Abs 1.6 0.7 - 4.0 K/uL   Monocytes Relative 9 %   Monocytes Absolute 0.6 0.1 - 1.0 K/uL   Eosinophils Relative 1 %   Eosinophils Absolute 0.1 0.0 - 0.5 K/uL   Basophils Relative 0 %   Basophils Absolute 0.0 0.0 - 0.1 K/uL   Immature Granulocytes 0 %   Abs Immature Granulocytes 0.02 0.00 - 0.07 K/uL    Comment: Performed at [facility], [address] [place], [place] [id number]  Comprehensive metabolic panel     Status: Abnormal   Collection Time: 08/14/21  2:54 PM  Result Value Ref Range   Sodium 141 135 - 145 mmol/L   Potassium 3.3 (L) 3.5 - 5.1 mmol/L  Chloride 104 98 - 111 mmol/L   CO2 31 22 - 32 mmol/L   Glucose, Bld 110 (H) 70 - 99 mg/dL    Comment: Glucose reference range applies only to samples taken after fasting for at least 8 hours.   BUN 13 6 - 20 mg/dL   Creatinine, Ser 8.11 0.61 - 1.24 mg/dL   Calcium 9.5 8.9 - 91.4 mg/dL   Total Protein 8.2 (H) 6.5 - 8.1 g/dL   Albumin 4.6 3.5 - 5.0 g/dL   AST 20 15 - 41 U/L   ALT 13 0 - 44 U/L   Alkaline Phosphatase 60 38 - 126 U/L   Total Bilirubin 0.7 0.3 - 1.2 mg/dL   GFR, Estimated >78 >29 mL/min    Comment: (NOTE) Calculated using the CKD-EPI Creatinine Equation (2021)    Anion gap 6 5 - 15    Comment: Performed at [facility], [address] [place], [place] [address]  Protime-INR     Status: None   Collection Time: 08/14/21  2:54 PM  Result Value Ref Range   Prothrombin Time 13.5 11.4 - 15.2 seconds   INR 1.0 0.8 - 1.2    Comment: (NOTE) INR goal varies based on device and  disease states. Performed at [facility], [address] [place], [place] [address]   APTT     Status: None   Collection Time: 08/14/21  2:54 PM  Result Value Ref Range   aPTT 25 24 - 36 seconds    Comment: Performed at [facility], [address] [place], [place] [address]  TSH     Status: None   Collection Time: 08/14/21  2:54 PM  Result Value Ref Range   TSH 0.401 0.350 - 4.500 uIU/mL    Comment: Performed by a 3rd Generation assay with a functional sensitivity of <=0.01 uIU/mL. Performed at [facility], [address] [place], [place] [address]   SARS CORONAVIRUS 2 (TAT 6-24 HRS) Nasopharyngeal Nasopharyngeal Swab     Status: None   Collection Time: 08/14/21  8:07 PM   Specimen: Nasopharyngeal Swab  Result Value Ref Range   SARS Coronavirus 2 NEGATIVE NEGATIVE    Comment: (NOTE) SARS-CoV-2 target nucleic acids are NOT DETECTED.  The SARS-CoV-2 RNA is generally detectable in upper and lower respiratory specimens during the acute phase of infection. Negative results do not preclude SARS-CoV-2 infection, do not rule out co-infections with other pathogens, and should not be used as the sole basis for treatment or other patient management decisions. Negative results must be combined with clinical observations, patient history, and epidemiological information. The expected result is Negative.  Fact Sheet for Patients: HairSlick.no  Fact Sheet for Healthcare Providers: quierodirigir.com  This test is not yet approved or cleared by the [place] FDA and  has been authorized for detection and/or diagnosis of SARS-CoV-2 by FDA under an Emergency Use Authorization (EUA). This EUA will remain  in effect (meaning this test can be used) for the duration of the COVID-19 declaration under Se ction 564(b)(1) of the Act, 21 U.S.C. section 360bbb-3(b)(1), unless the authorization is terminated  or revoked sooner.  Performed at [facility], [address], [place], [place] [address]   CBC     Status: None   Collection Time: 08/14/21 11:38 PM  Result Value Ref Range   WBC 5.8 4.0 - 10.5 K/uL   RBC 4.42 4.22 - 5.81 MIL/uL   Hemoglobin 13.9 13.0 - 17.0 g/dL  HCT 40.3 39.0 - 52.0 %   MCV 91.2 80.0 - 100.0 fL   MCH 31.4 26.0 - 34.0 pg   MCHC 34.5 30.0 - 36.0 g/dL   RDW 28.3 15.1 - 76.1 %   Platelets 207 150 - 400 K/uL   nRBC 0.0 0.0 - 0.2 %    Comment: Performed at [facility], [address] [place], [place] [address]  Creatinine, serum     Status: None   Collection Time: 08/14/21 11:38 PM  Result Value Ref Range   Creatinine, Ser 0.84 0.61 - 1.24 mg/dL   GFR, Estimated >10 >62 mL/min    Comment: (NOTE) Calculated using the CKD-EPI Creatinine Equation (2021) Performed at [facility], [address] [place], [place] [address]   Hemoglobin A1c     Status: None   Collection Time: 08/15/21  4:57 AM  Result Value Ref Range   Hgb A1c MFr Bld 5.5 4.8 - 5.6 %    Comment: (NOTE) Pre diabetes:          5.7%-6.4%  Diabetes:              >6.4%  Glycemic control for   <7.0% adults with diabetes    Mean Plasma Glucose 111.15 mg/dL    Comment: Performed at [facility], [address] [place], [place] [address]  Lipid panel     Status: Abnormal   Collection Time: 08/15/21  4:57 AM  Result Value Ref Range   Cholesterol 179 0 - 200 mg/dL   Triglycerides 350 <093 mg/dL   HDL 29 (L) >81 mg/dL   Total CHOL/HDL Ratio 6.2 RATIO   VLDL 29 0 - 40 mg/dL   LDL Cholesterol 829 (H) 0 - 99 mg/dL    Comment:        Total Cholesterol/HDL:CHD Risk Coronary Heart Disease Risk Table                     Men   Women  1/2 Average Risk   3.4   3.3  Average Risk       5.0   4.4  2 X Average Risk   9.6   7.1  3 X Average Risk  23.4   11.0        Use the calculated Patient Ratio above and the CHD Risk Table to determine the patient's CHD Risk.         ATP III CLASSIFICATION (LDL):  <100     mg/dL   Optimal  937-169  mg/dL   Near or Above                    Optimal  130-159  mg/dL   Borderline  678-938  mg/dL   High  >101     mg/dL   Very High Performed at [facility], [address] [place], [place] [address]   CBC     Status: None   Collection Time: 08/15/21  4:57 AM  Result Value Ref Range   WBC 4.9 4.0 - 10.5 K/uL   RBC 4.53 4.22 - 5.81 MIL/uL   Hemoglobin 14.0 13.0 - 17.0 g/dL   HCT 58.5 27.7 - 82.4 %   MCV 91.2 80.0 - 100.0 fL   MCH 30.9 26.0 - 34.0 pg   MCHC 33.9 30.0 - 36.0 g/dL   RDW 23.5 36.1 - 44.3 %   Platelets 220 150 - 400 K/uL   nRBC 0.0 0.0 -  0.2 %    Comment: Performed at [facility], [address] [place], [place] [address]  Basic metabolic panel     Status: Abnormal   Collection Time: 08/15/21  4:57 AM  Result Value Ref Range   Sodium 141 135 - 145 mmol/L   Potassium 3.9 3.5 - 5.1 mmol/L   Chloride 107 98 - 111 mmol/L   CO2 29 22 - 32 mmol/L   Glucose, Bld 110 (H) 70 - 99 mg/dL    Comment: Glucose reference range applies only to samples taken after fasting for at least 8 hours.   BUN 18 6 - 20 mg/dL   Creatinine, Ser 4.49 0.61 - 1.24 mg/dL   Calcium 8.9 8.9 - 75.3 mg/dL   GFR, Estimated >00 >51 mL/min    Comment: (NOTE) Calculated using the CKD-EPI Creatinine Equation (2021)    Anion gap 5 5 - 15    Comment: Performed at [facility], [address] [place], [place] [address]  HIV Antibody (routine testing w rflx)     Status: None   Collection Time: 08/15/21  4:57 AM  Result Value Ref Range   HIV Screen 4th Generation wRfx Non Reactive Non Reactive    Comment: Performed at [facility], [address], [place], [place] [address]  Glucose, capillary     Status: Abnormal   Collection Time: 08/15/21 12:05 PM  Result Value Ref Range   Glucose-Capillary 109 (H) 70 - 99 mg/dL    Comment: Glucose reference range applies only to samples taken after fasting for  at least 8 hours.    CT ANGIO HEAD W OR WO CONTRAST  Result Date: 08/15/2021 CLINICAL DATA:  60 year old male with neurologic deficit. Small right frontal lobe white matter lacunar infarct on brain MRI yesterday. EXAM: CT ANGIOGRAPHY HEAD AND NECK TECHNIQUE: Multidetector CT imaging of the head and neck was performed using the standard protocol during bolus administration of intravenous contrast. Multiplanar CT image reconstructions and MIPs were obtained to evaluate the vascular anatomy. Carotid stenosis measurements (when applicable) are obtained utilizing NASCET criteria, using the distal internal carotid diameter as the denominator. CONTRAST:  44mL OMNIPAQUE IOHEXOL 350 MG/ML SOLN COMPARISON:  Brain MRI and intracranial MRA 08/14/2021. FINDINGS: CTA NECK Skeleton: No acute osseous abnormality identified. Mild for age degeneration in the spine. Upper chest: Centrilobular and paraseptal emphysema, moderate. Other neck: Negative, no neck mass or lymphadenopathy. Aortic arch: 3 vessel arch configuration. Mild arch atherosclerosis. Right carotid system: Negative brachiocephalic artery. Soft plaque in the right CCA anteriorly but no stenosis before the bifurcation. Complex moderate soft and calcified plaque at the right ICA origin and bulb results in stenosis up to 60 % with respect to the distal vessel at the distal bulb (series 4, image 109). Proximal that the plaque is somewhat ulcerated (series 7, image 125). The right ICA remains patent to the skull base. Left carotid system: Left CCA soft and calcified plaque which continues to the left carotid bifurcation. Left ICA origin and distal left ICA involvement just below the skull base but less than 50 % stenosis with respect to the distal vessel. Vertebral arteries: Proximal right subclavian atherosclerosis without stenosis. Calcified plaque near the right vertebral artery origin but no origin stenosis. Tortuous right V1 segment. The right V2 segment is also  tortuous and the right vertebral is patent to the skull base without stenosis. Soft and calcified plaque in the proximal left subclavian artery without stenosis. Non dominant left vertebral artery has  a normal origin. Tortuous left vertebral remains non dominant and is patent to the skull base without stenosis. CTA HEAD Posterior circulation: Dominant right V4 segment. No significant distal vertebral plaque. No distal vertebral stenosis. Normal left PICA origin. Dominant right AICA is patent. Patent vertebrobasilar junction and basilar artery without stenosis. Patent SCA and PCA origins with bilateral posterior communicating arteries. Bilateral PCA branches are within normal limits. Anterior circulation: Both ICA siphons are patent but heavily calcified. There is both soft and calcified plaque of the left ICA siphon (soft plaque in the petrous segment) with moderate left cavernous ICA stenosis (series 6, image 106). Normal left ophthalmic and posterior communicating arteries. Similar right ICA siphon plaque and moderate to severe stenosis at the anterior genu (series 8, image 66) where decreased flow signal is noted on the MRA yesterday. Right ophthalmic and posterior communicating artery origins remain normal. Both carotid termini remain patent. Patent MCA and ACA origins with dominant left A1 segment. Anterior communicating artery and bilateral ACA branches are within normal limits. Left MCA M1 segment and trifurcation are patent without stenosis. Left MCA branches are within normal limits. Right MCA M1 segment and bifurcation are patent without stenosis. Right MCA branches are within normal limits. Venous sinuses: Patent. The right transverse and sigmoid sinus appear dominant. Anatomic variants: Dominant right vertebral artery. Review of the MIP images confirms the above findings IMPRESSION: 1. Negative for large vessel occlusion. 2. Positive for widespread atherosclerosis in the head and neck: Heavily calcified  ICA siphons with moderate to severe Right ICA anterior genu stenosis. Moderate Left cavernous ICA stenosis. Complex plaque at the Right ICA bulb is partially ulcerated and results in up to 60% stenosis. 3. But no significant vertebral artery or posterior circulation stenosis. 4. Aortic Atherosclerosis (ICD10-I70.0) and Emphysema (ICD10-J43.9). Electronically Signed   By: Odessa Fleming M.D.   On: 08/15/2021 10:59   CT HEAD WO CONTRAST ( )  Result Date: 08/14/2021 CLINICAL DATA:  Headache EXAM: CT HEAD WITHOUT CONTRAST TECHNIQUE: Contiguous axial images were obtained from the base of the skull through the vertex without intravenous contrast. COMPARISON:  None. FINDINGS: Brain: No acute infarct or hemorrhage. Lateral ventricles and midline structures are unremarkable. No acute extra-axial fluid collections. No mass effect. Vascular: No hyperdense vessel or unexpected calcification. Skull: Normal. Negative for fracture or focal lesion. Sinuses/Orbits: Polypoid mucosal thickening within the right sphenoid sinus. Remaining paranasal sinuses are clear. Other: None. IMPRESSION: 1. No acute intracranial process. 2. Right sphenoid sinus disease. Electronically Signed   By: Sharlet Salina M.D.   On: 08/14/2021 15:27   CT ANGIO NECK W OR WO CONTRAST  Result Date: 08/15/2021 CLINICAL DATA:  61 year old male with neurologic deficit. Small right frontal lobe white matter lacunar infarct on brain MRI yesterday. EXAM: CT ANGIOGRAPHY HEAD AND NECK TECHNIQUE: Multidetector CT imaging of the head and neck was performed using the standard protocol during bolus administration of intravenous contrast. Multiplanar CT image reconstructions and MIPs were obtained to evaluate the vascular anatomy. Carotid stenosis measurements (when applicable) are obtained utilizing NASCET criteria, using the distal internal carotid diameter as the denominator. CONTRAST:  7mL OMNIPAQUE IOHEXOL 350 MG/ML SOLN COMPARISON:  Brain MRI and intracranial MRA  08/14/2021. FINDINGS: CTA NECK Skeleton: No acute osseous abnormality identified. Mild for age degeneration in the spine. Upper chest: Centrilobular and paraseptal emphysema, moderate. Other neck: Negative, no neck mass or lymphadenopathy. Aortic arch: 3 vessel arch configuration. Mild arch atherosclerosis. Right carotid system: Negative brachiocephalic artery. Soft plaque in the right  CCA anteriorly but no stenosis before the bifurcation. Complex moderate soft and calcified plaque at the right ICA origin and bulb results in stenosis up to 60 % with respect to the distal vessel at the distal bulb (series 4, image 109). Proximal that the plaque is somewhat ulcerated (series 7, image 125). The right ICA remains patent to the skull base. Left carotid system: Left CCA soft and calcified plaque which continues to the left carotid bifurcation. Left ICA origin and distal left ICA involvement just below the skull base but less than 50 % stenosis with respect to the distal vessel. Vertebral arteries: Proximal right subclavian atherosclerosis without stenosis. Calcified plaque near the right vertebral artery origin but no origin stenosis. Tortuous right V1 segment. The right V2 segment is also tortuous and the right vertebral is patent to the skull base without stenosis. Soft and calcified plaque in the proximal left subclavian artery without stenosis. Non dominant left vertebral artery has a normal origin. Tortuous left vertebral remains non dominant and is patent to the skull base without stenosis. CTA HEAD Posterior circulation: Dominant right V4 segment. No significant distal vertebral plaque. No distal vertebral stenosis. Normal left PICA origin. Dominant right AICA is patent. Patent vertebrobasilar junction and basilar artery without stenosis. Patent SCA and PCA origins with bilateral posterior communicating arteries. Bilateral PCA branches are within normal limits. Anterior circulation: Both ICA siphons are patent but  heavily calcified. There is both soft and calcified plaque of the left ICA siphon (soft plaque in the petrous segment) with moderate left cavernous ICA stenosis (series 6, image 106). Normal left ophthalmic and posterior communicating arteries. Similar right ICA siphon plaque and moderate to severe stenosis at the anterior genu (series 8, image 66) where decreased flow signal is noted on the MRA yesterday. Right ophthalmic and posterior communicating artery origins remain normal. Both carotid termini remain patent. Patent MCA and ACA origins with dominant left A1 segment. Anterior communicating artery and bilateral ACA branches are within normal limits. Left MCA M1 segment and trifurcation are patent without stenosis. Left MCA branches are within normal limits. Right MCA M1 segment and bifurcation are patent without stenosis. Right MCA branches are within normal limits. Venous sinuses: Patent. The right transverse and sigmoid sinus appear dominant. Anatomic variants: Dominant right vertebral artery. Review of the MIP images confirms the above findings IMPRESSION: 1. Negative for large vessel occlusion. 2. Positive for widespread atherosclerosis in the head and neck: Heavily calcified ICA siphons with moderate to severe Right ICA anterior genu stenosis. Moderate Left cavernous ICA stenosis. Complex plaque at the Right ICA bulb is partially ulcerated and results in up to 60% stenosis. 3. But no significant vertebral artery or posterior circulation stenosis. 4. Aortic Atherosclerosis (ICD10-I70.0) and Emphysema (ICD10-J43.9). Electronically Signed   By: Odessa Fleming M.D.   On: 08/15/2021 10:59   MR ANGIO HEAD WO CONTRAST  Result Date: 08/14/2021 CLINICAL DATA:  Neuro deficit, acute, stroke suspected EXAM: MRI HEAD WITHOUT CONTRAST MRA HEAD WITHOUT CONTRAST TECHNIQUE: Multiplanar, multi-echo pulse sequences of the brain and surrounding structures were acquired without intravenous contrast. Angiographic images of the  Circle of Willis were acquired using MRA technique without intravenous contrast. COMPARISON:  Same day CT head. FINDINGS: MRI HEAD FINDINGS Brain: Small acute infarct in the right frontal white matter. Slight edema without mass effect. Otherwise, mild T2 hyperintensity within the white matter, nonspecific but compatible with chronic microvascular ischemic disease. Vascular: See below. Skull and upper cervical spine: Normal marrow signal. Sinuses/Orbits: Right sphenoid  sinus mucosal thickening with air-fluid level. Otherwise, clear sinuses. Unremarkable orbits. Other: Trace mastoid effusions. MRA HEAD FINDINGS Motion limited evaluation.  Within this limitation: Anterior circulation: Bilateral intracranial ICAs are patent with mild paraclinoid ICA stenosis. Bilateral MCAs are patent without high-grade proximal stenosis. No proximal M2 branch occlusion. Limited distal evaluation due to motion. Bilateral A1 ACAs and A2 ACAs are patent proximally. Posterior circulation: Bilateral intradural vertebral arteries, basilar artery, and posterior cerebral arteries are patent without proximal high-grade stenosis. Bilateral posterior communicating arteries. No aneurysm identified. Anatomic variants: See above. IMPRESSION: MRI: 1. Small acute infarct in the right frontal white matter. Slight edema without mass effect. 2. Mild chronic microvascular ischemic disease. MRA: 1. No large vessel occlusion or proximal high-grade stenosis. 2. Mild bilateral ICA stenosis. Electronically Signed   By: Feliberto Harts M.D.   On: 08/14/2021 17:50   MR Brain Wo Contrast (neuro protocol)  Result Date: 08/14/2021 CLINICAL DATA:  Neuro deficit, acute, stroke suspected EXAM: MRI HEAD WITHOUT CONTRAST MRA HEAD WITHOUT CONTRAST TECHNIQUE: Multiplanar, multi-echo pulse sequences of the brain and surrounding structures were acquired without intravenous contrast. Angiographic images of the Circle of Willis were acquired using MRA technique without  intravenous contrast. COMPARISON:  Same day CT head. FINDINGS: MRI HEAD FINDINGS Brain: Small acute infarct in the right frontal white matter. Slight edema without mass effect. Otherwise, mild T2 hyperintensity within the white matter, nonspecific but compatible with chronic microvascular ischemic disease. Vascular: See below. Skull and upper cervical spine: Normal marrow signal. Sinuses/Orbits: Right sphenoid sinus mucosal thickening with air-fluid level. Otherwise, clear sinuses. Unremarkable orbits. Other: Trace mastoid effusions. MRA HEAD FINDINGS Motion limited evaluation.  Within this limitation: Anterior circulation: Bilateral intracranial ICAs are patent with mild paraclinoid ICA stenosis. Bilateral MCAs are patent without high-grade proximal stenosis. No proximal M2 branch occlusion. Limited distal evaluation due to motion. Bilateral A1 ACAs and A2 ACAs are patent proximally. Posterior circulation: Bilateral intradural vertebral arteries, basilar artery, and posterior cerebral arteries are patent without proximal high-grade stenosis. Bilateral posterior communicating arteries. No aneurysm identified. Anatomic variants: See above. IMPRESSION: MRI: 1. Small acute infarct in the right frontal white matter. Slight edema without mass effect. 2. Mild chronic microvascular ischemic disease. MRA: 1. No large vessel occlusion or proximal high-grade stenosis. 2. Mild bilateral ICA stenosis. Electronically Signed   By: Feliberto Harts M.D.   On: 08/14/2021 17:50    Review of Systems Blood pressure 139/85, pulse 64, temperature 97.6 F (36.4 C), temperature source Oral, resp. rate 18, height  (1.854 m), weight 98.1 kg, SpO2 91 %. Physical Exam  Alert and Oriented x 3  Constitutional: Appears well-developed and well-nourished.  Psych: Affect appropriate to situation, calm, cooperative, jokes with examiner Eyes: No scleral injection HENT: No oropharyngeal obstruction.  MSK: no joint deformities.   Cardiovascular: Normal rate and regular rhythm.  Respiratory: Effort normal, non-labored breathing GI: Soft.  No distension. There is no tenderness.  Skin: Warm dry and intact visible skin Neurologically:  No deficits noted bilaterally in upper or lower extremities.     Assessment/Plan: Symptomatic right carotid artery stenosis He will be scheduled for carotid angiography possible carotid artery stenting.  Continue with antiplatelet aggregation and Xarelto 2.5 mg po BID.   Louisa Second 08/15/2021, 2:11 PM

## 2021-08-15 NOTE — Evaluation (Signed)
Physical Therapy Evaluation Patient Details Name: Michael Chung MRN: [id number] DOB: 03/10/1961 Today's Date: 08/15/2021   History of Present Illness  60 y.o. with PMH of history of HTN, Left and Right THA, who presented to [facility] ED with sudden onset of blurred vision in the right eye. MRI head revealed small acute infarct in the right frontal white matter  Clinical Impression  Pt alert in bed with spouse in room. Pt states PLOF as independent w/ all ADLs/IADLs, currently works and drives. Ambulates w/o any AD and denies falls within the last 6 months.   Pt is largely at functional baseline demonstrating independence with all tasks assessed today include bed mobility, transfers, ambulation, and stairs. No LOB or instability noted during treatment with full strength of BLE found during examination. Pt is cooperative, pleasant and notes confidence with all tasks assessed. No further PT required.     Follow Up Recommendations No PT follow up    Equipment Recommendations  None recommended by PT    Recommendations for Other Services       Precautions / Restrictions Precautions Precautions: Fall Restrictions Weight Bearing Restrictions: No      Mobility  Bed Mobility Overal bed mobility: Independent             General bed mobility comments: bed flat, does not utilize bed rails    Transfers Overall transfer level: Independent Equipment used: None             General transfer comment: does not require use of UE from lowered bed height  Ambulation/Gait Ambulation/Gait assistance: Independent Gait Distance (Feet): 200 Feet Assistive device: None Gait Pattern/deviations: Step-through pattern Gait velocity: Decreased   General Gait Details: no LOB noted, slightly stiff gait likely due to decreased gait speed  Stairs Stairs: Yes Stairs assistance: Independent Stair Management: One rail Right Number of Stairs: 5 General stair comments: no LOB, step-through pattern  w/ R hand rail  Wheelchair Mobility    Modified Rankin (Stroke Patients Only)       Balance Overall balance assessment: No apparent balance deficits (not formally assessed)                                           Pertinent Vitals/Pain Pain Assessment: No/denies pain    Home Living Family/patient expects to be discharged to:: Private residence Living Arrangements: Spouse/significant other Available Help at Discharge: Family Type of Home: House Home Access: Stairs to enter Entrance Stairs-Rails: Right Entrance Stairs-Number of Steps: 3 Home Layout: One level Home Equipment: Grab bars - tub/shower      Prior Function Level of Independence: Independent         Comments: Independent with all ADLs, ambulates w/o an AD, works, drives and denies falls w/in last 6 months     Hand Dominance        Extremity/Trunk Assessment   Upper Extremity Assessment Upper Extremity Assessment: Overall WFL for tasks assessed    Lower Extremity Assessment Lower Extremity Assessment: Overall WFL for tasks assessed (MMT bilat 5/5 hip flexion, knee ext, DF; SILT throughout LE)    Cervical / Trunk Assessment Cervical / Trunk Assessment: Normal  Communication   Communication: No difficulties  Cognition Arousal/Alertness: Awake/alert Behavior During Therapy: WFL for tasks assessed/performed Overall Cognitive Status: Within Functional Limits for tasks assessed  General Comments: AOx4      General Comments      Exercises     Assessment/Plan    PT Assessment Patent does not need any further PT services  PT Problem List         PT Treatment Interventions      PT Goals (Current goals can be found in the Care Plan section)  Acute Rehab PT Goals Patient Stated Goal: To go home PT Goal Formulation: With patient Time For Goal Achievement: 08/29/21 Potential to Achieve Goals: Good    Frequency     Barriers  to discharge        Co-evaluation               AM-PAC PT "6 Clicks" Mobility  Outcome Measure Help needed turning from your back to your side while in a flat bed without using bedrails?: None Help needed moving from lying on your back to sitting on the side of a flat bed without using bedrails?: None Help needed moving to and from a bed to a chair (including a wheelchair)?: None Help needed standing up from a chair using your arms (e.g., wheelchair or bedside chair)?: None Help needed to walk in hospital room?: None Help needed climbing 3-5 steps with a railing? : None 6 Click Score: 24    End of Session Equipment Utilized During Treatment: Gait belt Activity Tolerance: Patient tolerated treatment well Patient left: in bed;with family/visitor present Nurse Communication: Mobility status PT Visit Diagnosis: Other abnormalities of gait and mobility (R26.89)    Time: 8657-8469 PT Time Calculation (min) (ACUTE ONLY): 11 min   Charges:              [employer], SPT

## 2021-08-15 NOTE — Progress Notes (Addendum)
Patient ID: Michael Chung, male   DOB: [birth year], 60 y.o.   MRN: [id number]  PROGRESS NOTE    Michael Chung  WUJ:[id number] DOB: [birth year] DOA: 08/14/2021 PCP: Joaquim Namuncan, Graham S, MD   Brief Narrative:  60 year old male with history of smoking, hypertension presented with blurred vision in the right eye.  On presentation, CT of the head was negative for any acute intracranial abnormality.  MRI of the brain showed small acute infarct in the right frontal white matter.  MRA of the head was negative for large vessel occlusion.  Neurology was consulted.    Assessment & Plan:   Acute infarct in the right frontal lobe white matter Right eye blurred vision -Symptoms have resolved.  No new neurological symptoms since admission.  Neurology has been consulted. -Patient is tolerating diet.  No weakness of extremities. -2D echo pending -LDL 121.  A1c 5.5 -Continue aspirin and statin. Follow neurology recommendations. PT/OT/SLP eval   Hyperlipidemia -Continue statin  Hypertension -continue amlodipine       DVT prophylaxis: Lovenox Code Status: Full Family Communication: Wife at bedside Disposition Plan: Status is: Observation  The patient will require care spanning > 2 midnights and should be moved to inpatient because: Inpatient level of care appropriate due to severity of illness  Dispo: The patient is from: Home              Anticipated d/c is to: Home              Patient currently is not medically stable to d/c.   Difficult to place patient No  Consultants: Neurology  Procedures: 2D echo report pending  Antimicrobials: None   Subjective: Patient seen and examined at bedside.  His right eye vision seems to be back to normal.  Denies any weakness or numbness.  No overnight fever, vomiting or worsening shortness of breath reported.  Objective: Vitals:   08/15/21 0800 08/15/21 0840 08/15/21 0856 08/15/21 1042  BP:  (!) 141/80 122/79 120/78  Pulse: 65  67 64  Resp: 10  18    Temp: 98.4 F (36.9 C)  98.1 F (36.7 C) 98.2 F (36.8 C)  TempSrc:   Oral Oral  SpO2: 96%  97% 98%  Weight:      Height:       No intake or output data in the 24 hours ending 08/15/21 1118 Filed Weights   08/14/21 1433 08/14/21 2222  Weight: 102.5 kg 98.1 kg    Examination:  General exam: Appears calm and comfortable.  Currently on room air. Respiratory system: Bilateral decreased breath sounds at bases Cardiovascular system: S1 & S2 heard, Rate controlled Gastrointestinal system: Abdomen is nondistended, soft and nontender. Normal bowel sounds heard. Extremities: No cyanosis, clubbing, edema  Central nervous system: Alert and oriented. No focal neurological deficits. Moving extremities.  No extremity weakness appreciated. Skin: No rashes, lesions or ulcers Psychiatry: Judgement and insight appear normal. Mood & affect appropriate.     Data Reviewed: I have personally reviewed following labs and imaging studies  CBC: Recent Labs  Lab 08/14/21 1454 08/14/21 2338 08/15/21 0457  WBC 7.0 5.8 4.9  NEUTROABS 4.7  --   --   HGB 15.4 13.9 14.0  HCT 45.9 40.3 41.3  MCV 91.8 91.2 91.2  PLT 239 207 220   Basic Metabolic Panel: Recent Labs  Lab 08/14/21 1454 08/14/21 2338 08/15/21 0457  NA 141  --  141  K 3.3*  --  3.9  CL 104  --  107  CO2 31  --  29  GLUCOSE 110*  --  110*  BUN 13  --  18  CREATININE 0.85 0.84 0.92  CALCIUM 9.5  --  8.9   GFR: Estimated Creatinine Clearance: 106.6 mL/min (by C-G formula based on SCr of 0.92 mg/dL). Liver Function Tests: Recent Labs  Lab 08/14/21 1454  AST 20  ALT 13  ALKPHOS 60  BILITOT 0.7  PROT 8.2*  ALBUMIN 4.6   No results for input(s): LIPASE, AMYLASE in the last 168 hours. No results for input(s): AMMONIA in the last 168 hours. Coagulation Profile: Recent Labs  Lab 08/14/21 1454  INR 1.0   Cardiac Enzymes: No results for input(s): CKTOTAL, CKMB, CKMBINDEX, TROPONINI in the last 168 hours. BNP (last 3  results) No results for input(s): PROBNP in the last 8760 hours. HbA1C: Recent Labs    08/15/21 0457  HGBA1C 5.5   CBG: No results for input(s): GLUCAP in the last 168 hours. Lipid Profile: Recent Labs    08/15/21 0457  CHOL 179  HDL 29*  LDLCALC 121*  TRIG 144  CHOLHDL 6.2   Thyroid Function Tests: Recent Labs    08/14/21 1454  TSH 0.401   Anemia Panel: No results for input(s): VITAMINB12, FOLATE, FERRITIN, TIBC, IRON, RETICCTPCT in the last 72 hours. Sepsis Labs: No results for input(s): PROCALCITON, LATICACIDVEN in the last 168 hours.  Recent Results (from the past 240 hour(s))  SARS CORONAVIRUS 2 (TAT 6-24 HRS) Nasopharyngeal Nasopharyngeal Swab     Status: None   Collection Time: 08/14/21  8:07 PM   Specimen: Nasopharyngeal Swab  Result Value Ref Range Status   SARS Coronavirus 2 NEGATIVE NEGATIVE Final    Comment: (NOTE) SARS-CoV-2 target nucleic acids are NOT DETECTED.  The SARS-CoV-2 RNA is generally detectable in upper and lower respiratory specimens during the acute phase of infection. Negative results do not preclude SARS-CoV-2 infection, do not rule out co-infections with other pathogens, and should not be used as the sole basis for treatment or other patient management decisions. Negative results must be combined with clinical observations, patient history, and epidemiological information. The expected result is Negative.  Fact Sheet for Patients: HairSlick.no  Fact Sheet for Healthcare Providers: quierodirigir.com  This test is not yet approved or cleared by the [place] FDA and  has been authorized for detection and/or diagnosis of SARS-CoV-2 by FDA under an Emergency Use Authorization (EUA). This EUA will remain  in effect (meaning this test can be used) for the duration of the COVID-19 declaration under Se ction 564(b)(1) of the Act, 21 U.S.C. section 360bbb-3(b)(1), unless the  authorization is terminated or revoked sooner.  Performed at [facility], [address], [place], [place] [address]          Radiology Studies: CT ANGIO HEAD W OR WO CONTRAST  Result Date: 08/15/2021 CLINICAL DATA:  60 year old male with neurologic deficit. Small right frontal lobe white matter lacunar infarct on brain MRI yesterday. EXAM: CT ANGIOGRAPHY HEAD AND NECK TECHNIQUE: Multidetector CT imaging of the head and neck was performed using the standard protocol during bolus administration of intravenous contrast. Multiplanar CT image reconstructions and MIPs were obtained to evaluate the vascular anatomy. Carotid stenosis measurements (when applicable) are obtained utilizing NASCET criteria, using the distal internal carotid diameter as the denominator. CONTRAST:  84mL OMNIPAQUE IOHEXOL 350 MG/ML SOLN COMPARISON:  Brain MRI and intracranial MRA 08/14/2021. FINDINGS: CTA NECK Skeleton: No acute osseous abnormality identified. Mild for age degeneration  in the spine. Upper chest: Centrilobular and paraseptal emphysema, moderate. Other neck: Negative, no neck mass or lymphadenopathy. Aortic arch: 3 vessel arch configuration. Mild arch atherosclerosis. Right carotid system: Negative brachiocephalic artery. Soft plaque in the right CCA anteriorly but no stenosis before the bifurcation. Complex moderate soft and calcified plaque at the right ICA origin and bulb results in stenosis up to 60 % with respect to the distal vessel at the distal bulb (series 4, image 109). Proximal that the plaque is somewhat ulcerated (series 7, image 125). The right ICA remains patent to the skull base. Left carotid system: Left CCA soft and calcified plaque which continues to the left carotid bifurcation. Left ICA origin and distal left ICA involvement just below the skull base but less than 50 % stenosis with respect to the distal vessel. Vertebral arteries: Proximal right subclavian atherosclerosis without  stenosis. Calcified plaque near the right vertebral artery origin but no origin stenosis. Tortuous right V1 segment. The right V2 segment is also tortuous and the right vertebral is patent to the skull base without stenosis. Soft and calcified plaque in the proximal left subclavian artery without stenosis. Non dominant left vertebral artery has a normal origin. Tortuous left vertebral remains non dominant and is patent to the skull base without stenosis. CTA HEAD Posterior circulation: Dominant right V4 segment. No significant distal vertebral plaque. No distal vertebral stenosis. Normal left PICA origin. Dominant right AICA is patent. Patent vertebrobasilar junction and basilar artery without stenosis. Patent SCA and PCA origins with bilateral posterior communicating arteries. Bilateral PCA branches are within normal limits. Anterior circulation: Both ICA siphons are patent but heavily calcified. There is both soft and calcified plaque of the left ICA siphon (soft plaque in the petrous segment) with moderate left cavernous ICA stenosis (series 6, image 106). Normal left ophthalmic and posterior communicating arteries. Similar right ICA siphon plaque and moderate to severe stenosis at the anterior genu (series 8, image 66) where decreased flow signal is noted on the MRA yesterday. Right ophthalmic and posterior communicating artery origins remain normal. Both carotid termini remain patent. Patent MCA and ACA origins with dominant left A1 segment. Anterior communicating artery and bilateral ACA branches are within normal limits. Left MCA M1 segment and trifurcation are patent without stenosis. Left MCA branches are within normal limits. Right MCA M1 segment and bifurcation are patent without stenosis. Right MCA branches are within normal limits. Venous sinuses: Patent. The right transverse and sigmoid sinus appear dominant. Anatomic variants: Dominant right vertebral artery. Review of the MIP images confirms the  above findings IMPRESSION: 1. Negative for large vessel occlusion. 2. Positive for widespread atherosclerosis in the head and neck: Heavily calcified ICA siphons with moderate to severe Right ICA anterior genu stenosis. Moderate Left cavernous ICA stenosis. Complex plaque at the Right ICA bulb is partially ulcerated and results in up to 60% stenosis. 3. But no significant vertebral artery or posterior circulation stenosis. 4. Aortic Atherosclerosis (ICD10-I70.0) and Emphysema (ICD10-J43.9). Electronically Signed   By: Odessa Fleming M.D.   On: 08/15/2021 10:59   CT HEAD WO CONTRAST ( )  Result Date: 08/14/2021 CLINICAL DATA:  Headache EXAM: CT HEAD WITHOUT CONTRAST TECHNIQUE: Contiguous axial images were obtained from the base of the skull through the vertex without intravenous contrast. COMPARISON:  None. FINDINGS: Brain: No acute infarct or hemorrhage. Lateral ventricles and midline structures are unremarkable. No acute extra-axial fluid collections. No mass effect. Vascular: No hyperdense vessel or unexpected calcification. Skull: Normal. Negative for fracture or  focal lesion. Sinuses/Orbits: Polypoid mucosal thickening within the right sphenoid sinus. Remaining paranasal sinuses are clear. Other: None. IMPRESSION: 1. No acute intracranial process. 2. Right sphenoid sinus disease. Electronically Signed   By: Sharlet Salina M.D.   On: 08/14/2021 15:27   CT ANGIO NECK W OR WO CONTRAST  Result Date: 08/15/2021 CLINICAL DATA:  60 year old male with neurologic deficit. Small right frontal lobe white matter lacunar infarct on brain MRI yesterday. EXAM: CT ANGIOGRAPHY HEAD AND NECK TECHNIQUE: Multidetector CT imaging of the head and neck was performed using the standard protocol during bolus administration of intravenous contrast. Multiplanar CT image reconstructions and MIPs were obtained to evaluate the vascular anatomy. Carotid stenosis measurements (when applicable) are obtained utilizing NASCET criteria, using  the distal internal carotid diameter as the denominator. CONTRAST:  64mL OMNIPAQUE IOHEXOL 350 MG/ML SOLN COMPARISON:  Brain MRI and intracranial MRA 08/14/2021. FINDINGS: CTA NECK Skeleton: No acute osseous abnormality identified. Mild for age degeneration in the spine. Upper chest: Centrilobular and paraseptal emphysema, moderate. Other neck: Negative, no neck mass or lymphadenopathy. Aortic arch: 3 vessel arch configuration. Mild arch atherosclerosis. Right carotid system: Negative brachiocephalic artery. Soft plaque in the right CCA anteriorly but no stenosis before the bifurcation. Complex moderate soft and calcified plaque at the right ICA origin and bulb results in stenosis up to 60 % with respect to the distal vessel at the distal bulb (series 4, image 109). Proximal that the plaque is somewhat ulcerated (series 7, image 125). The right ICA remains patent to the skull base. Left carotid system: Left CCA soft and calcified plaque which continues to the left carotid bifurcation. Left ICA origin and distal left ICA involvement just below the skull base but less than 50 % stenosis with respect to the distal vessel. Vertebral arteries: Proximal right subclavian atherosclerosis without stenosis. Calcified plaque near the right vertebral artery origin but no origin stenosis. Tortuous right V1 segment. The right V2 segment is also tortuous and the right vertebral is patent to the skull base without stenosis. Soft and calcified plaque in the proximal left subclavian artery without stenosis. Non dominant left vertebral artery has a normal origin. Tortuous left vertebral remains non dominant and is patent to the skull base without stenosis. CTA HEAD Posterior circulation: Dominant right V4 segment. No significant distal vertebral plaque. No distal vertebral stenosis. Normal left PICA origin. Dominant right AICA is patent. Patent vertebrobasilar junction and basilar artery without stenosis. Patent SCA and PCA origins  with bilateral posterior communicating arteries. Bilateral PCA branches are within normal limits. Anterior circulation: Both ICA siphons are patent but heavily calcified. There is both soft and calcified plaque of the left ICA siphon (soft plaque in the petrous segment) with moderate left cavernous ICA stenosis (series 6, image 106). Normal left ophthalmic and posterior communicating arteries. Similar right ICA siphon plaque and moderate to severe stenosis at the anterior genu (series 8, image 66) where decreased flow signal is noted on the MRA yesterday. Right ophthalmic and posterior communicating artery origins remain normal. Both carotid termini remain patent. Patent MCA and ACA origins with dominant left A1 segment. Anterior communicating artery and bilateral ACA branches are within normal limits. Left MCA M1 segment and trifurcation are patent without stenosis. Left MCA branches are within normal limits. Right MCA M1 segment and bifurcation are patent without stenosis. Right MCA branches are within normal limits. Venous sinuses: Patent. The right transverse and sigmoid sinus appear dominant. Anatomic variants: Dominant right vertebral artery. Review of the MIP  images confirms the above findings IMPRESSION: 1. Negative for large vessel occlusion. 2. Positive for widespread atherosclerosis in the head and neck: Heavily calcified ICA siphons with moderate to severe Right ICA anterior genu stenosis. Moderate Left cavernous ICA stenosis. Complex plaque at the Right ICA bulb is partially ulcerated and results in up to 60% stenosis. 3. But no significant vertebral artery or posterior circulation stenosis. 4. Aortic Atherosclerosis (ICD10-I70.0) and Emphysema (ICD10-J43.9). Electronically Signed   By: Odessa Fleming M.D.   On: 08/15/2021 10:59   MR ANGIO HEAD WO CONTRAST  Result Date: 08/14/2021 CLINICAL DATA:  Neuro deficit, acute, stroke suspected EXAM: MRI HEAD WITHOUT CONTRAST MRA HEAD WITHOUT CONTRAST TECHNIQUE:  Multiplanar, multi-echo pulse sequences of the brain and surrounding structures were acquired without intravenous contrast. Angiographic images of the Circle of Willis were acquired using MRA technique without intravenous contrast. COMPARISON:  Same day CT head. FINDINGS: MRI HEAD FINDINGS Brain: Small acute infarct in the right frontal white matter. Slight edema without mass effect. Otherwise, mild T2 hyperintensity within the white matter, nonspecific but compatible with chronic microvascular ischemic disease. Vascular: See below. Skull and upper cervical spine: Normal marrow signal. Sinuses/Orbits: Right sphenoid sinus mucosal thickening with air-fluid level. Otherwise, clear sinuses. Unremarkable orbits. Other: Trace mastoid effusions. MRA HEAD FINDINGS Motion limited evaluation.  Within this limitation: Anterior circulation: Bilateral intracranial ICAs are patent with mild paraclinoid ICA stenosis. Bilateral MCAs are patent without high-grade proximal stenosis. No proximal M2 branch occlusion. Limited distal evaluation due to motion. Bilateral A1 ACAs and A2 ACAs are patent proximally. Posterior circulation: Bilateral intradural vertebral arteries, basilar artery, and posterior cerebral arteries are patent without proximal high-grade stenosis. Bilateral posterior communicating arteries. No aneurysm identified. Anatomic variants: See above. IMPRESSION: MRI: 1. Small acute infarct in the right frontal white matter. Slight edema without mass effect. 2. Mild chronic microvascular ischemic disease. MRA: 1. No large vessel occlusion or proximal high-grade stenosis. 2. Mild bilateral ICA stenosis. Electronically Signed   By: Feliberto Harts M.D.   On: 08/14/2021 17:50   MR Brain Wo Contrast (neuro protocol)  Result Date: 08/14/2021 CLINICAL DATA:  Neuro deficit, acute, stroke suspected EXAM: MRI HEAD WITHOUT CONTRAST MRA HEAD WITHOUT CONTRAST TECHNIQUE: Multiplanar, multi-echo pulse sequences of the brain and  surrounding structures were acquired without intravenous contrast. Angiographic images of the Circle of Willis were acquired using MRA technique without intravenous contrast. COMPARISON:  Same day CT head. FINDINGS: MRI HEAD FINDINGS Brain: Small acute infarct in the right frontal white matter. Slight edema without mass effect. Otherwise, mild T2 hyperintensity within the white matter, nonspecific but compatible with chronic microvascular ischemic disease. Vascular: See below. Skull and upper cervical spine: Normal marrow signal. Sinuses/Orbits: Right sphenoid sinus mucosal thickening with air-fluid level. Otherwise, clear sinuses. Unremarkable orbits. Other: Trace mastoid effusions. MRA HEAD FINDINGS Motion limited evaluation.  Within this limitation: Anterior circulation: Bilateral intracranial ICAs are patent with mild paraclinoid ICA stenosis. Bilateral MCAs are patent without high-grade proximal stenosis. No proximal M2 branch occlusion. Limited distal evaluation due to motion. Bilateral A1 ACAs and A2 ACAs are patent proximally. Posterior circulation: Bilateral intradural vertebral arteries, basilar artery, and posterior cerebral arteries are patent without proximal high-grade stenosis. Bilateral posterior communicating arteries. No aneurysm identified. Anatomic variants: See above. IMPRESSION: MRI: 1. Small acute infarct in the right frontal white matter. Slight edema without mass effect. 2. Mild chronic microvascular ischemic disease. MRA: 1. No large vessel occlusion or proximal high-grade stenosis. 2. Mild bilateral ICA stenosis. Electronically Signed  By: Feliberto Harts M.D.   On: 08/14/2021 17:50        Scheduled Meds:  amLODipine  10 mg Oral Daily   aspirin EC  81 mg Oral Daily   atorvastatin  80 mg Oral QHS   enoxaparin (LOVENOX) injection  40 mg Subcutaneous Q24H   pantoprazole  40 mg Oral Daily   Continuous Infusions:        Glade Lloyd, MD [facility] 08/15/2021,  11:18 AM

## 2021-08-15 NOTE — Consult Note (Signed)
Neurology Consultation Reason for Consult: Punctate stroke on MRI Requesting Physician: Glade Lloyd  CC: Right eye blurry vision, resolved  History is obtained from: Patient, wife at bedside and chart review   HPI: Michael Chung is a 60 y.o. male with a Pmhx sig for smoking (quit 1 month ago), hypertension, new dx of hyperlipidemia here  Presenting with acute onset vision loss right eye, frosted glass appearance, not total loss of vision lasting several hours and then gradually clearing, associated with slight pain behind the eye which also resolved. No other associate symptoms or recent neurological symptoms.   LKW: Noon on 9/10 tPA given?: No, symptoms rapidly resolving per ED notes, neurology contacted 9/11 Premorbid modified rankin scale:      0 - No symptoms.  ROS: All other review of systems was negative except as noted in the HPI.   Past Medical History:  Diagnosis Date   Arthritis    Coffee ground emesis    2016 after nsaid use   Dupuytren contracture 06/2013   right long, ring, small fingers   GERD (gastroesophageal reflux disease)    uses OTC as needed    Past Surgical History:  Procedure Laterality Date   DUPUYTREN CONTRACTURE RELEASE Right 06/27/2013   Procedure: EXCISION DUPUYTRENS RIGHT LONG AND RING  FINGERS;  Surgeon: Wyn Forster., MD;  Location: [facility];  Service: Orthopedics;  Laterality: Right;   TOTAL HIP ARTHROPLASTY Left 11/25/2015   Procedure: TOTAL HIP ARTHROPLASTY;  Surgeon: Deeann Saint, MD;  Location: [facility];  Service: Orthopedics;  Laterality: Left;   TOTAL HIP ARTHROPLASTY Right    2020   Current Outpatient Medications  Medication Instructions   amLODipine (NORVASC) 10 mg, Oral, Daily   cyclobenzaprine (FLEXERIL) 10 mg, Oral, Daily PRN   ibuprofen (ADVIL) 800 MG tablet TAKE 1 TABLET BY MOUTH EVERY 8 HOURS AS NEEDED WITH FOOD (IF ANY GI UPSET, STOP MED & TELL MD)   omeprazole (PRILOSEC) 20 MG capsule TAKE 1 CAPSULE BY  MOUTH EVERY DAY   sildenafil (REVATIO) 20-100 mg, Oral, Daily PRN     Family History  Problem Relation Age of Onset   Hypertension Father    Diabetes Father    Heart Problems Father    Diabetes Mother    Hypertension Mother    Dementia Mother    Colon cancer Brother    Colon cancer Brother    Prostate cancer Neg Hx    Social History:  reports that he has been smoking cigarettes. He has a 20.00 pack-year smoking history. He has never used smokeless tobacco. He reports that he does not drink alcohol and does not use drugs.   Exam: Current vital signs: BP 132/85 (BP Location: Left Arm)   Pulse 73   Temp 98.5 F (36.9 C)   Resp 16   Ht 6\' 1"  (1.854 m)   Wt 98.1 kg   SpO2 96%   BMI 28.53 kg/m  Vital signs in last 24 hours: Temp:  [98.2 F (36.8 C)-98.5 F (36.9 C)] 98.5 F (36.9 C) (09/11 0452) Pulse Rate:  [53-73] 73 (09/11 0452) Resp:  [9-20] 16 (09/11 0452) BP: (132-164)/(69-98) 132/85 (09/11 0452) SpO2:  [96 %-100 %] 96 % (09/11 0452) Weight:  [98.1 kg-102.5 kg] 98.1 kg (09/10 2222)   Physical Exam  Constitutional: Appears well-developed and well-nourished.  Psych: Affect appropriate to situation, calm, cooperative, jokes with examiner Eyes: No scleral injection HENT: No oropharyngeal obstruction.  MSK: no joint deformities.  Cardiovascular:  Normal rate and regular rhythm.  Respiratory: Effort normal, non-labored breathing GI: Soft.  No distension. There is no tenderness.  Skin: Warm dry and intact visible skin  Neuro: Mental Status: Patient is awake, alert, oriented to person, place, month, year, and situation. Patient is able to give a clear and coherent history. No signs of aphasia or neglect Cranial Nerves: II: Visual Fields are full. Pupils are equal, round, and reactive to light.   III,IV, VI: EOMI without ptosis or diploplia.  V: Facial sensation is symmetric to temperature VII: Facial movement is symmetric.  VIII: hearing is intact to voice X:  Uvula elevates symmetrically XI: Shoulder shrug is symmetric. XII: tongue is midline without atrophy or fasciculations.  Motor: Tone is normal. Bulk is normal. 5/5 strength was present in all four extremities.  Sensory: Sensation is symmetric to light touch and temperature in the arms and legs. Deep Tendon Reflexes: 1+ and symmetric in the biceps 2+ symmetric patellae.  Cerebellar: FNF and HKS are intact bilaterally  NIHSS total 0  I have reviewed labs in epic and the results pertinent to this consultation are:  Lipid Panel     Component Value Date/Time   CHOL 179 08/15/2021 0457   TRIG 144 08/15/2021 0457   HDL 29 (L) 08/15/2021 0457   CHOLHDL 6.2 08/15/2021 0457   VLDL 29 08/15/2021 0457   LDLCALC 121 (H) 08/15/2021 0457   LDLDIRECT 138.0 05/21/2018 1704   Lab Results  Component Value Date   HGBA1C 5.5 08/15/2021   I have reviewed the images obtained: CT head without acute intracranial process, right sphenoid sinus disease MRI brain with punctate acute right frontal stroke, personally reviewed MRA with some motion limitation, no LVO but he does have some ICA stenosis as well as some motion limitations makes it difficult to fully assess CTA personally reviewed, agree with radiology read:  1. Negative for large vessel occlusion. 2. Positive for widespread atherosclerosis in the head and neck: Heavily calcified ICA siphons with moderate to severe Right ICA anterior genu stenosis. Moderate Left cavernous ICA stenosis.  Complex plaque at the Right ICA bulb is partially ulcerated and results in up to 60% stenosis. 3. But no significant vertebral artery or posterior circulation stenosis. 4. Aortic Atherosclerosis (ICD10-I70.0) and Emphysema (ICD10-J43.9).    Impression: Symptomatic 60% right carotid artery stenosis with punctate right frontal stroke and blurred vision of the right eye.   Recommendations:  # Punctate right frontal stroke, atheroembolic etiology  - Stroke  labs lipid panel with LDL of 121, agree with addition of atorvastatin per primary team - CTA given MRA with somewhat motion limited on my read, patient additionally needs neck vessel imaging so obtained head and neck on my recommendation, with results as above - Frequent neuro checks - Echocardiogram read pending, neurology will follow this up but otherwise be available on an as-needed basis, please re-consult if new questions arise - Aspirin 81 mg daily lifelong - Plavix 300 mg load with 75 mg daily, final course deferred to vascular surgery - Appreciate vascular surgery consultation for consideration of urgent CEA - Risk factor modification -- smoking cessation counseling, diet, exercise and medication adherence discussed with patient  - Telemetry monitoring; 30 day event monitor on discharge if no arrythmias captured  - Blood pressure goal   - Normotension as patient is outside of the window for permissive hypertension and has been tolerating normotension here - No need for PT/OT/speech consult given patient is back to baseline  Brooke Dare MD-PhD  [facility] [phone] [facility] coverage for [facility] is from 8 AM to 4 AM in-house and 4 PM to 8 PM by telephone/video. 8 PM to 8 AM emergent questions or overnight urgent questions should be addressed to Teleneurology On-call or [facility] neurohospitalist; contact information can be found on AMION  Discussed with Dr. Hanley Ben (primary team) and Dr. Docia Barrier (vascular) as well as patient and wife at bedside.

## 2021-08-15 NOTE — Evaluation (Signed)
Occupational Therapy Evaluation Patient Details Name: Michael Chung MRN: [id number] DOB: 09/13/1961 Today's Date: 08/15/2021    History of Present Illness 60 y.o. with PMH of history of HTN, Left and Right THA, who presented to [facility] ED with sudden onset of blurred vision in the right eye. MRI head revealed small acute infarct in the right frontal white matter   Clinical Impression   Pt seen for OT evaluation this date. Prior to hospital admission, pt was independent in all aspects of ADL/IADL and functional mobility, and denies falls history in past 6 months. Pt lives with his spouse in a 1 story home with 3 steps to enter. Currently, pt reporting symptoms have resolved. Pt demonstrates baseline independence to perform ADL and mobility tasks and no strength, sensory, coordination, cognitive, or visual deficits appreciated with assessment. No skilled OT needs identified. Will sign off. Please re-consult if additional OT needs arise.     Follow Up Recommendations  No OT follow up    Equipment Recommendations  None recommended by OT       Precautions / Restrictions Precautions Precautions: None Restrictions Weight Bearing Restrictions: No      Mobility Bed Mobility Overal bed mobility: Independent             General bed mobility comments: able to perform with ease with HOB flat    Transfers Overall transfer level: Independent Equipment used: None             General transfer comment: good eccentric control    Balance Overall balance assessment: No apparent balance deficits (not formally assessed)                                         ADL either performed or assessed with clinical judgement   ADL Overall ADL's : Independent                                       General ADL Comments: Independent with sit<>stand LB dressing and functional mobility (360 ft)     Vision Ability to See in Adequate Light: 0 Adequate Patient  Visual Report: No change from baseline Vision Assessment?: Yes Eye Alignment: Within Functional Limits Ocular Range of Motion: Within Functional Limits Tracking/Visual Pursuits: Able to track stimulus in all quads without difficulty Convergence: Within functional limits Visual Fields: No apparent deficits            Pertinent Vitals/Pain Pain Assessment: No/denies pain        Extremity/Trunk Assessment Upper Extremity Assessment Upper Extremity Assessment: Overall WFL for tasks assessed   Lower Extremity Assessment Lower Extremity Assessment: Overall WFL for tasks assessed       Communication Communication Communication: No difficulties   Cognition Arousal/Alertness: Awake/alert Behavior During Therapy: WFL for tasks assessed/performed Overall Cognitive Status: Within Functional Limits for tasks assessed                                                Home Living Family/patient expects to be discharged to:: Private residence Living Arrangements: Spouse/significant other Available Help at Discharge: Family Type of Home: House Home Access: Stairs to enter [employer] of Steps: 3  Home Layout: One level     Bathroom Shower/Tub: Tub/shower unit         Home Equipment: Grab bars - tub/shower          Prior Functioning/Environment Level of Independence: Independent        Comments: Independent with ADLs, IADLs, and functional mobility (without AD). Pt drives, works, and enjoys fishing. Denies any recent falls        OT Problem List: Impaired vision/perception      OT Treatment/Interventions:      OT Goals(Current goals can be found in the care plan section) Acute Rehab OT Goals Patient Stated Goal: to go home OT Goal Formulation: With patient Time For Goal Achievement: 08/29/21 Potential to Achieve Goals: Good  OT Frequency:      AM-PAC OT "6 Clicks" Daily Activity     Outcome Measure Help from another person eating  meals?: None Help from another person taking care of personal grooming?: None Help from another person toileting, which includes using toliet, bedpan, or urinal?: None Help from another person bathing (including washing, rinsing, drying)?: None Help from another person to put on and taking off regular upper body clothing?: None Help from another person to put on and taking off regular lower body clothing?: None 6 Click Score: 24   End of Session Nurse Communication: Mobility status  Activity Tolerance: Patient tolerated treatment well Patient left: in bed;with call bell/phone within reach;with family/visitor present  OT Visit Diagnosis: Unsteadiness on feet (R26.81);Other symptoms and signs involving the nervous system (Z61.096)                Time: 0454-0981 OT Time Calculation (min): 10 min Charges:  OT General Charges $OT Visit: 1 Visit OT Evaluation $OT Eval Moderate Complexity: 1 Mod  Matthew Folks, OTR/L [facility] [phone]

## 2021-08-16 ENCOUNTER — Encounter
Admission: EM | Disposition: A | Discharge status: Home / Self Care | Payer: Self-pay | Source: Home / Self Care | Attending: Internal Medicine

## 2021-08-16 ENCOUNTER — Encounter: Payer: Self-pay | Admitting: Vascular Surgery

## 2021-08-16 DIAGNOSIS — I6529 Occlusion and stenosis of unspecified carotid artery: Secondary | ICD-10-CM

## 2021-08-16 DIAGNOSIS — I63231 Cerebral infarction due to unspecified occlusion or stenosis of right carotid arteries: Secondary | ICD-10-CM

## 2021-08-16 HISTORY — PX: CAROTID PTA/STENT INTERVENTION: CATH118231

## 2021-08-16 LAB — CBC
HCT: 39.7 % (ref 39.0–52.0)
Hemoglobin: 13.5 g/dL (ref 13.0–17.0)
MCH: 31.3 pg (ref 26.0–34.0)
MCHC: 34 g/dL (ref 30.0–36.0)
MCV: 92.1 fL (ref 80.0–100.0)
Platelets: 214 10*3/uL (ref 150–400)
RBC: 4.31 MIL/uL (ref 4.22–5.81)
RDW: 12.8 % (ref 11.5–15.5)
WBC: 4.2 10*3/uL (ref 4.0–10.5)
nRBC: 0 % (ref 0.0–0.2)

## 2021-08-16 LAB — BASIC METABOLIC PANEL
Anion gap: 6 (ref 5–15)
BUN: 16 mg/dL (ref 6–20)
CO2: 27 mmol/L (ref 22–32)
Calcium: 8.9 mg/dL (ref 8.9–10.3)
Chloride: 107 mmol/L (ref 98–111)
Creatinine, Ser: 1.01 mg/dL (ref 0.61–1.24)
GFR, Estimated: >60 mL/min (ref >60)
Glucose, Bld: 106 mg/dL — ABNORMAL HIGH (ref 70–99)
Potassium: 3.6 mmol/L (ref 3.5–5.1)
Sodium: 140 mmol/L (ref 135–145)

## 2021-08-16 LAB — GLUCOSE, CAPILLARY
Glucose-Capillary: 69 mg/dL — ABNORMAL LOW (ref 70–99)
Glucose-Capillary: 118 mg/dL — ABNORMAL HIGH (ref 70–99)

## 2021-08-16 LAB — POCT ACTIVATED CLOTTING TIME: Activated Clotting Time: 254 seconds

## 2021-08-16 LAB — MRSA NEXT GEN BY PCR, NASAL: MRSA by PCR Next Gen: NOT DETECTED

## 2021-08-16 SURGERY — CAROTID ANGIOGRAPHY
Anesthesia: Moderate Sedation | Laterality: Right

## 2021-08-16 SURGERY — CAROTID PTA/STENT INTERVENTION
Anesthesia: Moderate Sedation | Laterality: Right

## 2021-08-16 MED ORDER — ATROPINE SULFATE 1 MG/10ML IJ SOSY
PREFILLED_SYRINGE | INTRAMUSCULAR | Status: DC | PRN
  Administered 2021-08-16: 1 mg via INTRAVENOUS

## 2021-08-16 MED ORDER — POTASSIUM CHLORIDE CRYS ER 20 MEQ PO TBCR: 20.0000 meq | EXTENDED_RELEASE_TABLET | Freq: Every day | ORAL | Status: DC | PRN

## 2021-08-16 MED ORDER — OXYCODONE-ACETAMINOPHEN 5-325 MG PO TABS: 1.0000 | ORAL_TABLET | ORAL | Status: DC | PRN

## 2021-08-16 MED ORDER — PHENYLEPHRINE HCL-NACL 20-0.9 MG/250ML-% IV SOLN
INTRAVENOUS | Status: AC
  Filled 2021-08-16: qty 250

## 2021-08-16 MED ORDER — MIDAZOLAM HCL 2 MG/2ML IJ SOLN
INTRAMUSCULAR | Status: DC | PRN
  Administered 2021-08-16: 2 mg via INTRAVENOUS

## 2021-08-16 MED ORDER — ALUM & MAG HYDROXIDE-SIMETH 200-200-20 MG/5ML PO SUSP: 15.0000 mL | ORAL | Status: DC | PRN

## 2021-08-16 MED ORDER — MIDAZOLAM HCL 5 MG/5ML IJ SOLN
INTRAMUSCULAR | Status: AC
  Filled 2021-08-16: qty 5

## 2021-08-16 MED ORDER — ATORVASTATIN CALCIUM 10 MG PO TABS: 10.0000 mg | ORAL_TABLET | Freq: Every day | ORAL | Status: DC

## 2021-08-16 MED ORDER — DIPHENHYDRAMINE HCL 50 MG/ML IJ SOLN
INTRAMUSCULAR | Status: DC | PRN
  Administered 2021-08-16: 50 mg via INTRAVENOUS

## 2021-08-16 MED ORDER — CLOPIDOGREL BISULFATE 75 MG PO TABS: 150.0000 mg | ORAL_TABLET | Freq: Every day | ORAL | Status: DC

## 2021-08-16 MED ORDER — SODIUM CHLORIDE 0.9 % IV SOLN: 500.0000 mL | Freq: Once | INTRAVENOUS | Status: DC | PRN

## 2021-08-16 MED ORDER — ATROPINE SULFATE 1 MG/10ML IJ SOSY
PREFILLED_SYRINGE | INTRAMUSCULAR | Status: AC
  Filled 2021-08-16: qty 10

## 2021-08-16 MED ORDER — SODIUM CHLORIDE 0.9 % IV SOLN
INTRAVENOUS | Status: DC
  Administered 2021-08-16: 1000 mL via INTRAVENOUS

## 2021-08-16 MED ORDER — PHENOL 1.4 % MT LIQD
1.0000 | OROMUCOSAL | Status: DC | PRN
  Filled 2021-08-16: qty 177

## 2021-08-16 MED ORDER — IODIXANOL 320 MG/ML IV SOLN
INTRAVENOUS | Status: DC | PRN
  Administered 2021-08-16: 60 mL via INTRA_ARTERIAL

## 2021-08-16 MED ORDER — CEFAZOLIN SODIUM-DEXTROSE 2-4 GM/100ML-% IV SOLN
INTRAVENOUS | Status: AC
  Administered 2021-08-16: 2 g via INTRAVENOUS
  Filled 2021-08-16: qty 100

## 2021-08-16 MED ORDER — HYDRALAZINE HCL 20 MG/ML IJ SOLN: 5.0000 mg | INTRAMUSCULAR | Status: DC | PRN

## 2021-08-16 MED ORDER — FAMOTIDINE 20 MG IN NS 100 ML IVPB
20.0000 mg | Freq: Two times a day (BID) | INTRAVENOUS | Status: DC
  Administered 2021-08-16: 20 mg via INTRAVENOUS
  Filled 2021-08-16: qty 100

## 2021-08-16 MED ORDER — FENTANYL CITRATE PF 50 MCG/ML IJ SOSY
PREFILLED_SYRINGE | INTRAMUSCULAR | Status: AC
  Filled 2021-08-16: qty 1

## 2021-08-16 MED ORDER — LABETALOL HCL 5 MG/ML IV SOLN: 10.0000 mg | INTRAVENOUS | Status: DC | PRN

## 2021-08-16 MED ORDER — ACETAMINOPHEN 325 MG PO TABS: 325.0000 mg | ORAL_TABLET | ORAL | Status: DC | PRN

## 2021-08-16 MED ORDER — SODIUM CHLORIDE 0.9 % IV SOLN: INTRAVENOUS | Status: DC

## 2021-08-16 MED ORDER — MAGNESIUM SULFATE 2 GM/50ML IV SOLN
2.0000 g | Freq: Every day | INTRAVENOUS | Status: DC | PRN
Start: 2021-08-16 — End: 2021-08-17
  Filled 2021-08-16: qty 50

## 2021-08-16 MED ORDER — PHENYLEPHRINE 40 MCG/ML (10ML) SYRINGE FOR IV PUSH (FOR BLOOD PRESSURE SUPPORT)
PREFILLED_SYRINGE | INTRAVENOUS | Status: AC
  Filled 2021-08-16: qty 10

## 2021-08-16 MED ORDER — ACETAMINOPHEN 650 MG RE SUPP: 325.0000 mg | RECTAL | Status: DC | PRN

## 2021-08-16 MED ORDER — CEFAZOLIN SODIUM-DEXTROSE 2-4 GM/100ML-% IV SOLN
2.0000 g | Freq: Three times a day (TID) | INTRAVENOUS | Status: AC
  Administered 2021-08-16 – 2021-08-17 (×2): 2 g via INTRAVENOUS
  Filled 2021-08-16 (×3): qty 100

## 2021-08-16 MED ORDER — HEPARIN SODIUM (PORCINE) 1000 UNIT/ML IJ SOLN
INTRAMUSCULAR | Status: DC | PRN
  Administered 2021-08-16: 7000 [IU] via INTRAVENOUS

## 2021-08-16 MED ORDER — CEFAZOLIN SODIUM-DEXTROSE 2-4 GM/100ML-% IV SOLN: 2.0000 g | INTRAVENOUS | Status: AC

## 2021-08-16 MED ORDER — MORPHINE SULFATE (PF) 2 MG/ML IV SOLN: 2.0000 mg | INTRAVENOUS | Status: DC | PRN

## 2021-08-16 MED ORDER — ASPIRIN EC 81 MG PO TBEC
81.0000 mg | DELAYED_RELEASE_TABLET | Freq: Every day | ORAL | Status: DC
  Administered 2021-08-17: 81 mg via ORAL
  Filled 2021-08-16: qty 1

## 2021-08-16 MED ORDER — SODIUM CHLORIDE 0.9 % IV BOLUS
500.0000 mL | Freq: Once | INTRAVENOUS | Status: AC
  Administered 2021-08-16: 500 mL via INTRAVENOUS

## 2021-08-16 MED ORDER — ONDANSETRON HCL 4 MG/2ML IJ SOLN: 4.0000 mg | Freq: Four times a day (QID) | INTRAMUSCULAR | Status: DC | PRN

## 2021-08-16 MED ORDER — FENTANYL CITRATE (PF) 100 MCG/2ML IJ SOLN
INTRAMUSCULAR | Status: DC | PRN
  Administered 2021-08-16: 50 ug via INTRAVENOUS

## 2021-08-16 MED ORDER — DIPHENHYDRAMINE HCL 50 MG/ML IJ SOLN
INTRAMUSCULAR | Status: AC
  Filled 2021-08-16: qty 1

## 2021-08-16 MED ORDER — GUAIFENESIN-DM 100-10 MG/5ML PO SYRP: 15.0000 mL | ORAL_SOLUTION | ORAL | Status: DC | PRN

## 2021-08-16 MED ORDER — CLOPIDOGREL BISULFATE 75 MG PO TABS: 75.0000 mg | ORAL_TABLET | Freq: Every day | ORAL | Status: DC

## 2021-08-16 MED ORDER — HEPARIN SODIUM (PORCINE) 1000 UNIT/ML IJ SOLN
INTRAMUSCULAR | Status: AC
  Filled 2021-08-16: qty 1

## 2021-08-16 MED ORDER — METOPROLOL TARTRATE 5 MG/5ML IV SOLN: 2.0000 mg | INTRAVENOUS | Status: DC | PRN

## 2021-08-16 SURGICAL SUPPLY — 18 items
BALLN VIATRAC 5X30X135 (BALLOONS) ×2
BALLOON VIATRAC 5X30X135 (BALLOONS) IMPLANT
CATH ANGIO 5F PIGTAIL 100CM (CATHETERS) ×1 IMPLANT
CATH BEACON 5 .035 100 H1 TIP (CATHETERS) ×1 IMPLANT
COVER DRAPE FLUORO 36X44 (DRAPES) ×1 IMPLANT
COVER PROBE U/S 5X48 (MISCELLANEOUS) ×1 IMPLANT
DEVICE EMBOSHIELD NAV6 4.0-7.0 (FILTER) ×2 IMPLANT
DEVICE SAFEGUARD 24CM (GAUZE/BANDAGES/DRESSINGS) ×1 IMPLANT
DEVICE STARCLOSE SE CLOSURE (Vascular Products) ×1 IMPLANT
GLIDEWIRE ANGLED SS 035X260CM (WIRE) ×1 IMPLANT
GUIDEWIRE VASC STIFF .038X260 (WIRE) ×1 IMPLANT
KIT CAROTID MANIFOLD (MISCELLANEOUS) ×1 IMPLANT
KIT ENCORE 26 ADVANTAGE (KITS) ×1 IMPLANT
PACK ANGIOGRAPHY (CUSTOM PROCEDURE TRAY) ×2 IMPLANT
SHEATH BRITE TIP 5FRX11 (SHEATH) ×1 IMPLANT
SHEATH SHUTTLE 6FRX80 (SHEATH) ×1 IMPLANT
STENT XACT CAR 9-7X40X136 (Permanent Stent) ×1 IMPLANT
WIRE GUIDERIGHT .035X150 (WIRE) ×2 IMPLANT

## 2021-08-16 NOTE — Progress Notes (Signed)
Patient ID: Michael Chung, male   DOB: 1961-08-03, 60 y.o.   MRN: [id number]  PROGRESS NOTE    Michael Chung  FXT:[id number] DOB: 1961-10-09 DOA: 08/14/2021 PCP: Joaquim Nam, MD   Brief Narrative:  60 year old male with history of smoking, hypertension presented with blurred vision in the right eye.  On presentation, CT of the head was negative for any acute intracranial abnormality.  MRI of the brain showed small acute infarct in the right frontal white matter.  MRA of the head was negative for large vessel occlusion.  Neurology was consulted.    Assessment & Plan:   Acute infarct in the right frontal lobe white matter Right eye blurred vision -2D echo showed EF of 65 to 70% with mild to moderate aortic valve regurgitation -LDL 121.  A1c 5.5 -Neurology evaluation appreciated: Continue aspirin lifelong.  Continue Plavix for now, duration to be decided by vascular surgery.  Continue Lipitor.  Outpatient follow-up with neurology  Symptomatic right carotid artery stenosis -Vascular surgery planning for catheter angiography with possible carotid artery stenting today.   Hyperlipidemia -Continue statin  Hypertension -continue amlodipine    DVT prophylaxis: Lovenox Code Status: Full Family Communication: Wife at bedside Disposition Plan: Status is: Observation  The patient will require care spanning > 2 midnights and should be moved to inpatient because: Inpatient level of care appropriate due to severity of illness  Dispo: The patient is from: Home              Anticipated d/c is to: Home possibly tomorrow if cleared by vascular surgery.              Patient currently is not medically stable to d/c.   Difficult to place patient No  Consultants: Neurology/vascular surgery  Procedures: 2D echo  Antimicrobials: None   Subjective: Patient seen and examined at bedside.  No weakness, numbness or blurred vision reported.  No overnight fever or shortness of  breath. Objective: Vitals:   08/15/21 1206 08/15/21 1538 08/15/21 2104 08/16/21 0550  BP: 139/85 140/81 (!) 133/91 127/86  Pulse: 64 64 70 61  Resp: 18 18 16 16   Temp: 97.6 F (36.4 C) 98.1 F (36.7 C) (!) 97.4 F (36.3 C) (!) 97.5 F (36.4 C)  TempSrc: Oral Oral Oral Oral  SpO2: 91% 99% 98% 95%  Weight:      Height:        Intake/Output Summary (Last 24 hours) at 08/16/2021 0735 Last data filed at 08/15/2021 1408 Gross per 24 hour  Intake 240 ml  Output --  Net 240 ml   Filed Weights   08/14/21 1433 08/14/21 2222  Weight: 102.5 kg 98.1 kg    Examination:  General exam: On room air currently.  No distress. Respiratory system: Decreased breath sounds at bases bilaterally Cardiovascular system: Rate controlled, S1-S2 heard gastrointestinal system: Abdomen is distended slightly, soft and nontender.  Bowel sounds are heard  extremities: No edema or clubbing    Data Reviewed: I have personally reviewed following labs and imaging studies  CBC: Recent Labs  Lab 08/14/21 1454 08/14/21 2338 08/15/21 0457 08/16/21 0450  WBC 7.0 5.8 4.9 4.2  NEUTROABS 4.7  --   --   --   HGB 15.4 13.9 14.0 13.5  HCT 45.9 40.3 41.3 39.7  MCV 91.8 91.2 91.2 92.1  PLT 239 207 220 214    Basic Metabolic Panel: Recent Labs  Lab 08/14/21 1454 08/14/21 2338 08/15/21 0457 08/16/21 0450  NA 141  --  141 140  K 3.3*  --  3.9 3.6  CL 104  --  107 107  CO2 31  --  29 27  GLUCOSE 110*  --  110* 106*  BUN 13  --  18 16  CREATININE 0.85 0.84 0.92 1.01  CALCIUM 9.5  --  8.9 8.9    GFR: Estimated Creatinine Clearance: 97.1 mL/min (by C-G formula based on SCr of 1.01 mg/dL). Liver Function Tests: Recent Labs  Lab 08/14/21 1454  AST 20  ALT 13  ALKPHOS 60  BILITOT 0.7  PROT 8.2*  ALBUMIN 4.6    No results for input(s): LIPASE, AMYLASE in the last 168 hours. No results for input(s): AMMONIA in the last 168 hours. Coagulation Profile: Recent Labs  Lab 08/14/21 1454  INR 1.0     Cardiac Enzymes: No results for input(s): CKTOTAL, CKMB, CKMBINDEX, TROPONINI in the last 168 hours. BNP (last 3 results) No results for input(s): PROBNP in the last 8760 hours. HbA1C: Recent Labs    08/15/21 0457  HGBA1C 5.5    CBG: Recent Labs  Lab 08/15/21 1205  GLUCAP 109*   Lipid Profile: Recent Labs    08/15/21 0457  CHOL 179  HDL 29*  LDLCALC 121*  TRIG 144  CHOLHDL 6.2    Thyroid Function Tests: Recent Labs    08/14/21 1454  TSH 0.401    Anemia Panel: No results for input(s): VITAMINB12, FOLATE, FERRITIN, TIBC, IRON, RETICCTPCT in the last 72 hours. Sepsis Labs: No results for input(s): PROCALCITON, LATICACIDVEN in the last 168 hours.  Recent Results (from the past 240 hour(s))  SARS CORONAVIRUS 2 (TAT 6-24 HRS) Nasopharyngeal Nasopharyngeal Swab     Status: None   Collection Time: 08/14/21  8:07 PM   Specimen: Nasopharyngeal Swab  Result Value Ref Range Status   SARS Coronavirus 2 NEGATIVE NEGATIVE Final    Comment: (NOTE) SARS-CoV-2 target nucleic acids are NOT DETECTED.  The SARS-CoV-2 RNA is generally detectable in upper and lower respiratory specimens during the acute phase of infection. Negative results do not preclude SARS-CoV-2 infection, do not rule out co-infections with other pathogens, and should not be used as the sole basis for treatment or other patient management decisions. Negative results must be combined with clinical observations, patient history, and epidemiological information. The expected result is Negative.  Fact Sheet for Patients: HairSlick.no  Fact Sheet for Healthcare Providers: quierodirigir.com  This test is not yet approved or cleared by the [place] FDA and  has been authorized for detection and/or diagnosis of SARS-CoV-2 by FDA under an Emergency Use Authorization (EUA). This EUA will remain  in effect (meaning this test can be used) for the  duration of the COVID-19 declaration under Se ction 564(b)(1) of the Act, 21 U.S.C. section 360bbb-3(b)(1), unless the authorization is terminated or revoked sooner.  Performed at [facility], [address], [place], [place] [address]           Radiology Studies: CT ANGIO HEAD W OR WO CONTRAST  Result Date: 08/15/2021 CLINICAL DATA:  60 year old male with neurologic deficit. Small right frontal lobe white matter lacunar infarct on brain MRI yesterday. EXAM: CT ANGIOGRAPHY HEAD AND NECK TECHNIQUE: Multidetector CT imaging of the head and neck was performed using the standard protocol during bolus administration of intravenous contrast. Multiplanar CT image reconstructions and MIPs were obtained to evaluate the vascular anatomy. Carotid stenosis measurements (when applicable) are obtained utilizing NASCET criteria, using the distal internal carotid diameter as the  denominator. CONTRAST:  75mL OMNIPAQUE IOHEXOL 350 MG/ML SOLN COMPARISON:  Brain MRI and intracranial MRA 08/14/2021. FINDINGS: CTA NECK Skeleton: No acute osseous abnormality identified. Mild for age degeneration in the spine. Upper chest: Centrilobular and paraseptal emphysema, moderate. Other neck: Negative, no neck mass or lymphadenopathy. Aortic arch: 3 vessel arch configuration. Mild arch atherosclerosis. Right carotid system: Negative brachiocephalic artery. Soft plaque in the right CCA anteriorly but no stenosis before the bifurcation. Complex moderate soft and calcified plaque at the right ICA origin and bulb results in stenosis up to 60 % with respect to the distal vessel at the distal bulb (series 4, image 109). Proximal that the plaque is somewhat ulcerated (series 7, image 125). The right ICA remains patent to the skull base. Left carotid system: Left CCA soft and calcified plaque which continues to the left carotid bifurcation. Left ICA origin and distal left ICA involvement just below the skull base but less than 50 %  stenosis with respect to the distal vessel. Vertebral arteries: Proximal right subclavian atherosclerosis without stenosis. Calcified plaque near the right vertebral artery origin but no origin stenosis. Tortuous right V1 segment. The right V2 segment is also tortuous and the right vertebral is patent to the skull base without stenosis. Soft and calcified plaque in the proximal left subclavian artery without stenosis. Non dominant left vertebral artery has a normal origin. Tortuous left vertebral remains non dominant and is patent to the skull base without stenosis. CTA HEAD Posterior circulation: Dominant right V4 segment. No significant distal vertebral plaque. No distal vertebral stenosis. Normal left PICA origin. Dominant right AICA is patent. Patent vertebrobasilar junction and basilar artery without stenosis. Patent SCA and PCA origins with bilateral posterior communicating arteries. Bilateral PCA branches are within normal limits. Anterior circulation: Both ICA siphons are patent but heavily calcified. There is both soft and calcified plaque of the left ICA siphon (soft plaque in the petrous segment) with moderate left cavernous ICA stenosis (series 6, image 106). Normal left ophthalmic and posterior communicating arteries. Similar right ICA siphon plaque and moderate to severe stenosis at the anterior genu (series 8, image 66) where decreased flow signal is noted on the MRA yesterday. Right ophthalmic and posterior communicating artery origins remain normal. Both carotid termini remain patent. Patent MCA and ACA origins with dominant left A1 segment. Anterior communicating artery and bilateral ACA branches are within normal limits. Left MCA M1 segment and trifurcation are patent without stenosis. Left MCA branches are within normal limits. Right MCA M1 segment and bifurcation are patent without stenosis. Right MCA branches are within normal limits. Venous sinuses: Patent. The right transverse and sigmoid  sinus appear dominant. Anatomic variants: Dominant right vertebral artery. Review of the MIP images confirms the above findings IMPRESSION: 1. Negative for large vessel occlusion. 2. Positive for widespread atherosclerosis in the head and neck: Heavily calcified ICA siphons with moderate to severe Right ICA anterior genu stenosis. Moderate Left cavernous ICA stenosis. Complex plaque at the Right ICA bulb is partially ulcerated and results in up to 60% stenosis. 3. But no significant vertebral artery or posterior circulation stenosis. 4. Aortic Atherosclerosis (ICD10-I70.0) and Emphysema (ICD10-J43.9). Electronically Signed   By: Odessa FlemingH  Hall M.D.   On: 08/15/2021 10:59   CT HEAD WO CONTRAST (5MM)  Result Date: 08/14/2021 CLINICAL DATA:  Headache EXAM: CT HEAD WITHOUT CONTRAST TECHNIQUE: Contiguous axial images were obtained from the base of the skull through the vertex without intravenous contrast. COMPARISON:  None. FINDINGS: Brain: No acute infarct  or hemorrhage. Lateral ventricles and midline structures are unremarkable. No acute extra-axial fluid collections. No mass effect. Vascular: No hyperdense vessel or unexpected calcification. Skull: Normal. Negative for fracture or focal lesion. Sinuses/Orbits: Polypoid mucosal thickening within the right sphenoid sinus. Remaining paranasal sinuses are clear. Other: None. IMPRESSION: 1. No acute intracranial process. 2. Right sphenoid sinus disease. Electronically Signed   By: Sharlet Salina M.D.   On: 08/14/2021 15:27   CT ANGIO NECK W OR WO CONTRAST  Result Date: 08/15/2021 CLINICAL DATA:  60 year old male with neurologic deficit. Small right frontal lobe white matter lacunar infarct on brain MRI yesterday. EXAM: CT ANGIOGRAPHY HEAD AND NECK TECHNIQUE: Multidetector CT imaging of the head and neck was performed using the standard protocol during bolus administration of intravenous contrast. Multiplanar CT image reconstructions and MIPs were obtained to evaluate the  vascular anatomy. Carotid stenosis measurements (when applicable) are obtained utilizing NASCET criteria, using the distal internal carotid diameter as the denominator. CONTRAST:  39mL OMNIPAQUE IOHEXOL 350 MG/ML SOLN COMPARISON:  Brain MRI and intracranial MRA 08/14/2021. FINDINGS: CTA NECK Skeleton: No acute osseous abnormality identified. Mild for age degeneration in the spine. Upper chest: Centrilobular and paraseptal emphysema, moderate. Other neck: Negative, no neck mass or lymphadenopathy. Aortic arch: 3 vessel arch configuration. Mild arch atherosclerosis. Right carotid system: Negative brachiocephalic artery. Soft plaque in the right CCA anteriorly but no stenosis before the bifurcation. Complex moderate soft and calcified plaque at the right ICA origin and bulb results in stenosis up to 60 % with respect to the distal vessel at the distal bulb (series 4, image 109). Proximal that the plaque is somewhat ulcerated (series 7, image 125). The right ICA remains patent to the skull base. Left carotid system: Left CCA soft and calcified plaque which continues to the left carotid bifurcation. Left ICA origin and distal left ICA involvement just below the skull base but less than 50 % stenosis with respect to the distal vessel. Vertebral arteries: Proximal right subclavian atherosclerosis without stenosis. Calcified plaque near the right vertebral artery origin but no origin stenosis. Tortuous right V1 segment. The right V2 segment is also tortuous and the right vertebral is patent to the skull base without stenosis. Soft and calcified plaque in the proximal left subclavian artery without stenosis. Non dominant left vertebral artery has a normal origin. Tortuous left vertebral remains non dominant and is patent to the skull base without stenosis. CTA HEAD Posterior circulation: Dominant right V4 segment. No significant distal vertebral plaque. No distal vertebral stenosis. Normal left PICA origin. Dominant right  AICA is patent. Patent vertebrobasilar junction and basilar artery without stenosis. Patent SCA and PCA origins with bilateral posterior communicating arteries. Bilateral PCA branches are within normal limits. Anterior circulation: Both ICA siphons are patent but heavily calcified. There is both soft and calcified plaque of the left ICA siphon (soft plaque in the petrous segment) with moderate left cavernous ICA stenosis (series 6, image 106). Normal left ophthalmic and posterior communicating arteries. Similar right ICA siphon plaque and moderate to severe stenosis at the anterior genu (series 8, image 66) where decreased flow signal is noted on the MRA yesterday. Right ophthalmic and posterior communicating artery origins remain normal. Both carotid termini remain patent. Patent MCA and ACA origins with dominant left A1 segment. Anterior communicating artery and bilateral ACA branches are within normal limits. Left MCA M1 segment and trifurcation are patent without stenosis. Left MCA branches are within normal limits. Right MCA M1 segment and bifurcation are patent  without stenosis. Right MCA branches are within normal limits. Venous sinuses: Patent. The right transverse and sigmoid sinus appear dominant. Anatomic variants: Dominant right vertebral artery. Review of the MIP images confirms the above findings IMPRESSION: 1. Negative for large vessel occlusion. 2. Positive for widespread atherosclerosis in the head and neck: Heavily calcified ICA siphons with moderate to severe Right ICA anterior genu stenosis. Moderate Left cavernous ICA stenosis. Complex plaque at the Right ICA bulb is partially ulcerated and results in up to 60% stenosis. 3. But no significant vertebral artery or posterior circulation stenosis. 4. Aortic Atherosclerosis (ICD10-I70.0) and Emphysema (ICD10-J43.9). Electronically Signed   By: Odessa Fleming M.D.   On: 08/15/2021 10:59   MR ANGIO HEAD WO CONTRAST  Result Date: 08/14/2021 CLINICAL DATA:   Neuro deficit, acute, stroke suspected EXAM: MRI HEAD WITHOUT CONTRAST MRA HEAD WITHOUT CONTRAST TECHNIQUE: Multiplanar, multi-echo pulse sequences of the brain and surrounding structures were acquired without intravenous contrast. Angiographic images of the Circle of Willis were acquired using MRA technique without intravenous contrast. COMPARISON:  Same day CT head. FINDINGS: MRI HEAD FINDINGS Brain: Small acute infarct in the right frontal white matter. Slight edema without mass effect. Otherwise, mild T2 hyperintensity within the white matter, nonspecific but compatible with chronic microvascular ischemic disease. Vascular: See below. Skull and upper cervical spine: Normal marrow signal. Sinuses/Orbits: Right sphenoid sinus mucosal thickening with air-fluid level. Otherwise, clear sinuses. Unremarkable orbits. Other: Trace mastoid effusions. MRA HEAD FINDINGS Motion limited evaluation.  Within this limitation: Anterior circulation: Bilateral intracranial ICAs are patent with mild paraclinoid ICA stenosis. Bilateral MCAs are patent without high-grade proximal stenosis. No proximal M2 branch occlusion. Limited distal evaluation due to motion. Bilateral A1 ACAs and A2 ACAs are patent proximally. Posterior circulation: Bilateral intradural vertebral arteries, basilar artery, and posterior cerebral arteries are patent without proximal high-grade stenosis. Bilateral posterior communicating arteries. No aneurysm identified. Anatomic variants: See above. IMPRESSION: MRI: 1. Small acute infarct in the right frontal white matter. Slight edema without mass effect. 2. Mild chronic microvascular ischemic disease. MRA: 1. No large vessel occlusion or proximal high-grade stenosis. 2. Mild bilateral ICA stenosis. Electronically Signed   By: Feliberto Harts M.D.   On: 08/14/2021 17:50   MR Brain Wo Contrast (neuro protocol)  Result Date: 08/14/2021 CLINICAL DATA:  Neuro deficit, acute, stroke suspected EXAM: MRI HEAD  WITHOUT CONTRAST MRA HEAD WITHOUT CONTRAST TECHNIQUE: Multiplanar, multi-echo pulse sequences of the brain and surrounding structures were acquired without intravenous contrast. Angiographic images of the Circle of Willis were acquired using MRA technique without intravenous contrast. COMPARISON:  Same day CT head. FINDINGS: MRI HEAD FINDINGS Brain: Small acute infarct in the right frontal white matter. Slight edema without mass effect. Otherwise, mild T2 hyperintensity within the white matter, nonspecific but compatible with chronic microvascular ischemic disease. Vascular: See below. Skull and upper cervical spine: Normal marrow signal. Sinuses/Orbits: Right sphenoid sinus mucosal thickening with air-fluid level. Otherwise, clear sinuses. Unremarkable orbits. Other: Trace mastoid effusions. MRA HEAD FINDINGS Motion limited evaluation.  Within this limitation: Anterior circulation: Bilateral intracranial ICAs are patent with mild paraclinoid ICA stenosis. Bilateral MCAs are patent without high-grade proximal stenosis. No proximal M2 branch occlusion. Limited distal evaluation due to motion. Bilateral A1 ACAs and A2 ACAs are patent proximally. Posterior circulation: Bilateral intradural vertebral arteries, basilar artery, and posterior cerebral arteries are patent without proximal high-grade stenosis. Bilateral posterior communicating arteries. No aneurysm identified. Anatomic variants: See above. IMPRESSION: MRI: 1. Small acute infarct in the right frontal white  matter. Slight edema without mass effect. 2. Mild chronic microvascular ischemic disease. MRA: 1. No large vessel occlusion or proximal high-grade stenosis. 2. Mild bilateral ICA stenosis. Electronically Signed   By: Feliberto Harts M.D.   On: 08/14/2021 17:50   ECHOCARDIOGRAM COMPLETE  Result Date: 08/15/2021    ECHOCARDIOGRAM REPORT   Patient Name:   Michael Chung [place] Date of Exam: 08/15/2021 Medical Rec #:  [id number]     Height:       73.0 in Accession  #:    [id number]    Weight:       216.3 lb Date of Birth:  1961/01/07    BSA:          2.224 m Patient Age:    59 years      BP:           120/78 mmHg Patient Gender: M             HR:           59 bpm. Exam Location:  [facility] Procedure: 2D Echo Indications:     TIA  History:         Patient has prior history of Echocardiogram examinations. Risk                  Factors:Current Smoker and Hypertension.  Sonographer:     L Thornton-Maynard Referring Phys:  [id number] Charlane Ferretti Diagnosing Phys: Alwyn Pea MD IMPRESSIONS  1. Left ventricular ejection fraction, by estimation, is 65 to 70%. The left ventricle has normal function. The left ventricle has no regional wall motion abnormalities. Left ventricular diastolic parameters were normal.  2. Right ventricular systolic function is normal. The right ventricular size is normal. There is normal pulmonary artery systolic pressure.  3. The mitral valve is normal in structure. No evidence of mitral valve regurgitation.  4. The aortic valve is normal in structure. Aortic valve regurgitation is mild to moderate. FINDINGS  Left Ventricle: Left ventricular ejection fraction, by estimation, is 65 to 70%. The left ventricle has normal function. The left ventricle has no regional wall motion abnormalities. The left ventricular internal cavity size was normal in size. There is  no left ventricular hypertrophy. Left ventricular diastolic parameters were normal. Right Ventricle: The right ventricular size is normal. No increase in right ventricular wall thickness. Right ventricular systolic function is normal. There is normal pulmonary artery systolic pressure. The tricuspid regurgitant velocity is 2.16 m/s, and  with an assumed right atrial pressure of 3 mmHg, the estimated right ventricular systolic pressure is 21.7 mmHg. Left Atrium: Left atrial size was normal in size. Right Atrium: Right atrial size was normal in size. Pericardium: There is no evidence of pericardial  effusion. Mitral Valve: The mitral valve is normal in structure. No evidence of mitral valve regurgitation. Tricuspid Valve: The tricuspid valve is normal in structure. Tricuspid valve regurgitation is trivial. Aortic Valve: The aortic valve is normal in structure. Aortic valve regurgitation is mild to moderate. Aortic regurgitation PHT measures 481 msec. Aortic valve mean gradient measures 3.0 mmHg. Aortic valve peak gradient measures 6.1 mmHg. Aortic valve area, by VTI measures 4.67 cm. Pulmonic Valve: The pulmonic valve was normal in structure. Pulmonic valve regurgitation is not visualized. Aorta: The ascending aorta was not well visualized. IAS/Shunts: No atrial level shunt detected by color flow Doppler.  LEFT VENTRICLE PLAX 2D LVIDd:         5.32 cm  Diastology LVIDs:  3.28 cm  LV e' medial:    6.53 cm/s LV PW:         1.36 cm  LV E/e' medial:  6.5 LV IVS:        1.36 cm  LV e' lateral:   9.79 cm/s LVOT diam:     2.60 cm  LV E/e' lateral: 4.3 LV SV:         117 LV SV Index:   53 LVOT Area:     5.31 cm  RIGHT VENTRICLE RV S prime:     8.92 cm/s TAPSE (M-mode): 2.0 cm LEFT ATRIUM             Index LA diam:        3.40 cm 1.53 cm/m LA Vol (A2C):   49.1 ml 22.07 ml/m LA Vol (A4C):   42.4 ml 19.06 ml/m LA Biplane Vol: 46.5 ml 20.90 ml/m  AORTIC VALVE                   PULMONIC VALVE AV Area (Vmax):    4.75 cm    PV Vmax:       0.77 m/s AV Area (Vmean):   4.27 cm    PV Peak grad:  2.4 mmHg AV Area (VTI):     4.67 cm AV Vmax:           123.00 cm/s AV Vmean:          84.100 cm/s AV VTI:            0.251 m AV Peak Grad:      6.1 mmHg AV Mean Grad:      3.0 mmHg LVOT Vmax:         110.00 cm/s LVOT Vmean:        67.700 cm/s LVOT VTI:          0.221 m LVOT/AV VTI ratio: 0.88 AI PHT:            481 msec  AORTA Ao Root diam: 3.40 cm MV E velocity: 42.40 cm/s  TRICUSPID VALVE MV A velocity: 41.60 cm/s  TR Peak grad:   18.7 mmHg MV E/A ratio:  1.02        TR Vmax:        216.00 cm/s                              SHUNTS                            Systemic VTI:  0.22 m                            Systemic Diam: 2.60 cm Alwyn Pea MD Electronically signed by Alwyn Pea MD Signature Date/Time: 08/15/2021/9:21:03 PM    Final         Scheduled Meds:  amLODipine  10 mg Oral Daily   aspirin EC  81 mg Oral Daily   atorvastatin  80 mg Oral QHS   clopidogrel  75 mg Oral Daily   enoxaparin (LOVENOX) injection  40 mg Subcutaneous Q24H   pantoprazole  40 mg Oral Daily   Continuous Infusions:        Glade Lloyd, MD [facility] 08/16/2021, 7:35 AM

## 2021-08-16 NOTE — Progress Notes (Signed)
Pt admitted to ICU this AM. Pt on room air. Does not endorse any pain. A/O x4. Right fem site w/ PAD clean/dry/intact. Per Dr. Wyn Quaker, remove PAD TOMORROW AM.   Pt started on heart healthy diet.   Pt voiding independently.   CHG bath completed.   Pt in no acute distress @ this time. Will continue to monitor.

## 2021-08-16 NOTE — Interval H&P Note (Signed)
History and Physical Interval Note:  08/16/2021 8:59 AM  Michael Chung  has presented today for surgery, with the diagnosis of symptomatic right carotid stenosis.  The various methods of treatment have been discussed with the patient and family. After consideration of risks, benefits and other options for treatment, the patient has consented to  Procedure(s): CAROTID PTA/STENT INTERVENTION (Right) as a surgical intervention.  The patient's history has been reviewed, patient examined, no change in status, stable for surgery.  I have reviewed the patient's chart and labs.  Questions were answered to the patient's satisfaction.     [name]

## 2021-08-16 NOTE — Op Note (Signed)
OPERATIVE NOTE DATE: 08/16/2021  PROCEDURE:  Ultrasound guidance for vascular access right femoral artery  Placement of a 9 mm proximal 7 mm distal 4 cm long Exact stent with the use of the NAV-6 embolic protection device in the right carotid artery  PRE-OPERATIVE DIAGNOSIS: 1. Right carotid artery stenosis. 2. stroke  POST-OPERATIVE DIAGNOSIS:  Same as above  SURGEON: [name], MD  ASSISTANT(S):  none  ANESTHESIA: local/MCS  ESTIMATED BLOOD LOSS:  25 cc  CONTRAST: 60 cc  FLUORO TIME: 8.3 minutes  MODERATE CONSCIOUS SEDATION TIME:  Approximately 52 minutes using 2 mg of Versed and 50 mcg of Fentanyl  FINDING(S): 1.   60% highly ulcerated right carotid artery stenosis  SPECIMEN(S):   none  INDICATIONS:   Patient is a 60 y.o. male who presents with an ulcerated plaque and approximately 60% right carotid artery stenosis with recent stroke symptoms which resolved within 24 to 48 hours.  The patient is brought in for carotid angiogram with possible stenting if the anatomy is appropriate for stenting.  Risks and benefits were discussed and informed consent was obtained.   DESCRIPTION: After obtaining full informed written consent, the patient was brought back to the vascular suite and placed supine upon the table.  The patient received IV antibiotics prior to induction. Moderate conscious sedation was administered during a face to face encounter with the patient throughout the procedure with my supervision of the RN administering medicines and monitoring the patients vital signs and mental status throughout from the start of the procedure until the patient was taken to the recovery room.  After obtaining adequate anesthesia, the patient was prepped and draped in the standard fashion.   The right femoral artery was visualized with ultrasound and found to be widely patent. It was then accessed under direct ultrasound guidance without difficulty with a Seldinger needle. A permanent  image was recorded. A J-wire was placed and we then placed a 6 French sheath. The patient was then heparinized and a total of 7000 units of intravenous heparin were given and an ACT was checked to confirm successful anticoagulation. A pigtail catheter was then placed into the ascending aorta. This showed a type I aortic arch without proximal stenosis. I then selectively cannulated the innominate artery without difficulty with a headhunter catheter and advanced into the mid right common carotid artery.  Cervical and cerebral carotid angiography was then performed. There were no obvious intracranial filling defects with sluggish flow in the anterior cerebral artery. The carotid bifurcation demonstrated a roughly 60% stenosis which was highly ulcerative.  I then advanced into the external carotid artery with a Glidewire and the headhunter catheter and then exchanged for the Amplatz Super Stiff wire. Over the Amplatz Super Stiff wire, a 6 [place] shuttle sheath was placed into the mid common carotid artery. I then used the NAV-6  Embolic protection device and crossed the lesion and parked this in the distal internal carotid artery at the base of the skull.  I then selected a 9 mm proximal, 7 mm distal, 4 cm long exact stent. This was deployed across the lesion encompassing it in its entirety. A 5 mm x 3 cm length balloon was used to post dilate the stent. Only about a 10-15% residual stenosis was present after angioplasty. Completion angiogram showed normal intracranial filling without new defects. At this point I elected to terminate the procedure. The sheath was removed and StarClose closure device was deployed in the right femoral artery with excellent hemostatic  result. The patient was taken to the recovery room in stable condition having tolerated the procedure well.  COMPLICATIONS: none  CONDITION: stable  [name] 08/16/2021 10:55 AM   This note was created with Dragon Medical transcription system. Any  errors in dictation are purely unintentional.

## 2021-08-17 LAB — BASIC METABOLIC PANEL
Anion gap: 6 (ref 5–15)
BUN: 19 mg/dL (ref 6–20)
CO2: 27 mmol/L (ref 22–32)
Calcium: 8.8 mg/dL — ABNORMAL LOW (ref 8.9–10.3)
Chloride: 106 mmol/L (ref 98–111)
Creatinine, Ser: 0.94 mg/dL (ref 0.61–1.24)
GFR, Estimated: >60 mL/min (ref >60)
Glucose, Bld: 95 mg/dL (ref 70–99)
Potassium: 4 mmol/L (ref 3.5–5.1)
Sodium: 139 mmol/L (ref 135–145)

## 2021-08-17 LAB — CBC
HCT: 39.9 % (ref 39.0–52.0)
Hemoglobin: 13.4 g/dL (ref 13.0–17.0)
MCH: 30.7 pg (ref 26.0–34.0)
MCHC: 33.6 g/dL (ref 30.0–36.0)
MCV: 91.5 fL (ref 80.0–100.0)
Platelets: 190 10*3/uL (ref 150–400)
RBC: 4.36 MIL/uL (ref 4.22–5.81)
RDW: 12.9 % (ref 11.5–15.5)
WBC: 4.8 10*3/uL (ref 4.0–10.5)
nRBC: 0 % (ref 0.0–0.2)

## 2021-08-17 MED ORDER — CHLORHEXIDINE GLUCONATE CLOTH 2 % EX PADS
6.0000 | MEDICATED_PAD | Freq: Every day | CUTANEOUS | Status: DC
  Administered 2021-08-17: 6 via TOPICAL

## 2021-08-17 MED ORDER — ASPIRIN 81 MG PO TBEC: 81.0000 mg | DELAYED_RELEASE_TABLET | Freq: Every day | ORAL | 0 refills | Status: AC

## 2021-08-17 MED ORDER — FAMOTIDINE 20 MG PO TABS
20.0000 mg | ORAL_TABLET | Freq: Two times a day (BID) | ORAL | Status: DC
  Administered 2021-08-17: 20 mg via ORAL
  Filled 2021-08-17: qty 1

## 2021-08-17 MED ORDER — ATORVASTATIN CALCIUM 80 MG PO TABS: 80.0000 mg | ORAL_TABLET | Freq: Every day | ORAL | 0 refills | Status: DC

## 2021-08-17 MED ORDER — CLOPIDOGREL BISULFATE 75 MG PO TABS: 75.0000 mg | ORAL_TABLET | Freq: Every day | ORAL | 0 refills | Status: DC

## 2021-08-17 NOTE — Progress Notes (Signed)
PHARMACIST - PHYSICIAN COMMUNICATION  CONCERNING: IV to Oral Route Change Policy  RECOMMENDATION: This patient is receiving famotidine by the intravenous route.  Based on criteria approved by the Pharmacy and Therapeutics Committee, the intravenous medication(s) is/are being converted to the equivalent oral dose form(s).   DESCRIPTION: These criteria include: The patient is eating (either orally or via tube) and/or has been taking other orally administered medications for a least 24 hours The patient has no evidence of active gastrointestinal bleeding or impaired GI absorption (gastrectomy, short bowel, patient on TNA or NPO).  If you have questions about this conversion, please contact the Pharmacy Department    Tressie Ellis, [facility] 08/17/2021 8:40 AM

## 2021-08-17 NOTE — Progress Notes (Signed)
PAD removed per protocol, no hematoma, no bleeding noted on site. Plan is for dc today. Noted intermittent STE alarms this AM. Pt remains asymptommatic, VSS. EKG done, MD informed. Result reviewed by Dr. Hanley Ben- cleared for dc.   D/c instructions and care, AVS, and ffup appointments reviewed with pt and his wife. No other issues.

## 2021-08-17 NOTE — Progress Notes (Signed)
[facility]  Daily Progress Note   Subjective  -   Feels good.  Wants to go home.  No events overnight  Objective Vitals:   08/17/21 0400 08/17/21 0500 08/17/21 0600 08/17/21 0730  BP:  124/83 111/78 128/72  Pulse: 67 (!) 49 (!) 53 (!) 50  Resp: 17 13 13 18   Temp:    98.2 F (36.8 C)  TempSrc:    Oral  SpO2: 96% 96% 92% 96%  Weight:      Height:        Intake/Output Summary (Last 24 hours) at 08/17/2021 0840 Last data filed at 08/17/2021 0725 Gross per 24 hour  Intake 607.52 ml  Output 550 ml  Net 57.52 ml    PULM  CTAB CV  RRR VASC  Neuro exam intact. Access site C/D/I with PAD in place  Laboratory CBC    Component Value Date/Time   WBC 4.8 08/17/2021 0622   HGB 13.4 08/17/2021 0622   HCT 39.9 08/17/2021 0622   PLT 190 08/17/2021 0622    BMET    Component Value Date/Time   NA 139 08/17/2021 0622   K 4.0 08/17/2021 0622   CL 106 08/17/2021 0622   CO2 27 08/17/2021 0622   GLUCOSE 95 08/17/2021 0622   BUN 19 08/17/2021 0622   CREATININE 0.94 08/17/2021 0622   CREATININE 0.92 08/07/2020 1628   CALCIUM 8.8 (L) 08/17/2021 0622   GFRNONAA >60 08/17/2021 0622   GFRAA >60 11/26/2015 0601    Assessment/Planning: POD #1 s/p right carotid stent placement for symptomatic right carotid stenosis with ulceration  Doing well Ok to remove PAD, ambulate OK to discharge home later today on Aspirin, Plavix, and statin agent RTC to see me in 3-4 weeks with carotid duplex (order put in)   [name]  08/17/2021, 8:40 AM

## 2021-08-24 ENCOUNTER — Ambulatory Visit: Payer: 59 | Admitting: Internal Medicine

## 2021-08-27 ENCOUNTER — Ambulatory Visit: Payer: 59 | Admitting: Family Medicine

## 2021-08-27 ENCOUNTER — Encounter: Payer: Self-pay | Admitting: Family Medicine

## 2021-08-27 ENCOUNTER — Other Ambulatory Visit: Payer: Self-pay

## 2021-08-27 ENCOUNTER — Other Ambulatory Visit: Payer: Self-pay | Admitting: Family Medicine

## 2021-08-27 VITALS — BP 140/88 | HR 76 | Temp 97.8°F | Ht 73.0 in | Wt 224.0 lb

## 2021-08-27 DIAGNOSIS — I639 Cerebral infarction, unspecified: Secondary | ICD-10-CM | POA: Diagnosis not present

## 2021-08-27 DIAGNOSIS — I10 Essential (primary) hypertension: Secondary | ICD-10-CM | POA: Diagnosis not present

## 2021-08-27 LAB — CBC WITH DIFFERENTIAL/PLATELET
Absolute Monocytes: 852 cells/uL (ref 200–950)
Basophils Absolute: 31 cells/uL (ref 0–200)
Basophils Relative: 0.6 %
Eosinophils Absolute: 148 cells/uL (ref 15–500)
Eosinophils Relative: 2.9 %
HCT: 38.3 % — ABNORMAL LOW (ref 38.5–50.0)
Hemoglobin: 12.8 g/dL — ABNORMAL LOW (ref 13.2–17.1)
Lymphs Abs: 1642 cells/uL (ref 850–3900)
MCH: 30.3 pg (ref 27.0–33.0)
MCHC: 33.4 g/dL (ref 32.0–36.0)
MCV: 90.8 fL (ref 80.0–100.0)
MPV: 10 fL (ref 7.5–12.5)
Monocytes Relative: 16.7 %
Neutro Abs: 2428 cells/uL (ref 1500–7800)
Neutrophils Relative %: 47.6 %
Platelets: 235 10*3/uL (ref 140–400)
RBC: 4.22 10*6/uL (ref 4.20–5.80)
RDW: 12.5 % (ref 11.0–15.0)
Total Lymphocyte: 32.2 %
WBC: 5.1 10*3/uL (ref 3.8–10.8)

## 2021-08-27 LAB — BASIC METABOLIC PANEL
BUN: 18 mg/dL (ref 7–25)
CO2: 26 mmol/L (ref 20–32)
Calcium: 9.6 mg/dL (ref 8.6–10.3)
Chloride: 106 mmol/L (ref 98–110)
Creat: 1.06 mg/dL (ref 0.70–1.30)
Glucose, Bld: 115 mg/dL — ABNORMAL HIGH (ref 65–99)
Potassium: 3.9 mmol/L (ref 3.5–5.3)
Sodium: 141 mmol/L (ref 135–146)

## 2021-08-27 MED ORDER — PANTOPRAZOLE SODIUM 20 MG PO TBEC: 20.0000 mg | DELAYED_RELEASE_TABLET | Freq: Every day | ORAL | 3 refills | Status: DC

## 2021-08-27 MED ORDER — ATORVASTATIN CALCIUM 80 MG PO TABS: 80.0000 mg | ORAL_TABLET | Freq: Every day | ORAL | 3 refills | Status: DC

## 2021-08-27 MED ORDER — AMLODIPINE BESYLATE 10 MG PO TABS: 10.0000 mg | ORAL_TABLET | Freq: Every day | ORAL | 3 refills | Status: DC

## 2021-08-27 MED ORDER — LISINOPRIL 5 MG PO TABS: 5.0000 mg | ORAL_TABLET | Freq: Every day | ORAL | 3 refills | Status: DC

## 2021-08-27 MED ORDER — HYDROCHLOROTHIAZIDE 12.5 MG PO TABS: 12.5000 mg | ORAL_TABLET | Freq: Every day | ORAL | 3 refills | Status: DC

## 2021-08-27 MED ORDER — CLOPIDOGREL BISULFATE 75 MG PO TABS: 75.0000 mg | ORAL_TABLET | Freq: Every day | ORAL | 1 refills | Status: DC

## 2021-08-27 NOTE — Progress Notes (Signed)
This visit occurred during the SARS-CoV-2 public health emergency.  Safety protocols were in place, including screening questions prior to the visit, additional usage of staff PPE, and extensive cleaning of exam room while observing appropriate contact time as indicated for disinfecting solutions.  Inpatient f/u.  He was at church and had acute unilateral vision changes, went to the ER and was diagnosed with a stroke.  MRI showed small acute infarct of the right frontal white matter.  He had symptomatic right carotid artery stenosis and underwent right carotid artery stenting on 08/16/2021.  He was discharged on aspirin Plavix and statin.  He is going to have vascular and neurology follow-up in the future.  Here for inpatient follow-up and recheck his labs, discussed recent events and plan.  No chest pain.  Not short of breath.  No new neurologic symptoms.  No residual vision changes.  He feels back to baseline.  He is not having any neck pain.  No sequela from the stent procedure.  Discussed rationale for current medications.  Meds, vitals, and allergies reviewed.   ROS: Per HPI unless specifically indicated in ROS section   GEN: nad, alert and oriented HEENT: ncat NECK: supple w/o LA CV: rrr PULM: ctab, no inc wob ABD: soft, +bs EXT: no edema SKIN: no acute rash CN 2-12 wnl B, S/S x4

## 2021-08-27 NOTE — Patient Instructions (Addendum)
Goal BP below 140/90.  Vascular/neurology may talk to you about taking asprin and plavix together for now but eventually cutting back to one med.   Add on HCTZ for BP.  Update me about BP in about 10 days.   Stop omeprazole and change to pantoprazole.    Keep taking atorvastatin.  If you have muscle aches then let me know.   We'll update you about your labs.   Take care.  Glad to see you.

## 2021-08-29 NOTE — Assessment & Plan Note (Addendum)
Continue amlodipine.  Continue atorvastatin along with aspirin and Plavix.  I would appreciate guidance from neurology about continuing aspirin and Plavix.  Add on hydrochlorothiazide given his blood pressure elevation on home check.  We did not add an ACE or ARB given his history of angioedema with citrus.  Change omeprazole to pantoprazole given concurrent Plavix use.  Routine statin cautions given to patient.  See after visit summary.  I want him to update me about his blood pressure in about 10 days.  Routine cautions given to patient regarding hydrochlorothiazide.  He agrees with plan.  33 minutes were devoted to patient care in this encounter (this includes time spent reviewing the patient's file/history, interviewing and examining the patient, counseling/reviewing plan with patient).

## 2021-08-30 NOTE — Telephone Encounter (Signed)
PA has been done in [email or web address] and approved. Key: [id number]

## 2021-09-14 DIAGNOSIS — I10 Essential (primary) hypertension: Secondary | ICD-10-CM | POA: Diagnosis not present

## 2021-09-14 DIAGNOSIS — G459 Transient cerebral ischemic attack, unspecified: Secondary | ICD-10-CM | POA: Diagnosis not present

## 2021-09-14 DIAGNOSIS — I6521 Occlusion and stenosis of right carotid artery: Secondary | ICD-10-CM | POA: Diagnosis not present

## 2021-09-16 ENCOUNTER — Other Ambulatory Visit (INDEPENDENT_AMBULATORY_CARE_PROVIDER_SITE_OTHER): Payer: Self-pay | Admitting: Vascular Surgery

## 2021-09-16 DIAGNOSIS — I6521 Occlusion and stenosis of right carotid artery: Secondary | ICD-10-CM

## 2021-09-17 ENCOUNTER — Encounter (INDEPENDENT_AMBULATORY_CARE_PROVIDER_SITE_OTHER): Payer: Self-pay | Admitting: Vascular Surgery

## 2021-09-17 ENCOUNTER — Other Ambulatory Visit: Payer: Self-pay

## 2021-09-17 ENCOUNTER — Ambulatory Visit (INDEPENDENT_AMBULATORY_CARE_PROVIDER_SITE_OTHER): Payer: 59 | Admitting: Vascular Surgery

## 2021-09-17 ENCOUNTER — Ambulatory Visit (INDEPENDENT_AMBULATORY_CARE_PROVIDER_SITE_OTHER): Payer: 59

## 2021-09-17 VITALS — BP 155/93 | HR 73 | Resp 16 | Wt 227.0 lb

## 2021-09-17 DIAGNOSIS — I6521 Occlusion and stenosis of right carotid artery: Secondary | ICD-10-CM

## 2021-09-17 DIAGNOSIS — I6529 Occlusion and stenosis of unspecified carotid artery: Secondary | ICD-10-CM | POA: Insufficient documentation

## 2021-09-17 DIAGNOSIS — I10 Essential (primary) hypertension: Secondary | ICD-10-CM

## 2021-09-17 DIAGNOSIS — I6523 Occlusion and stenosis of bilateral carotid arteries: Secondary | ICD-10-CM

## 2021-09-17 DIAGNOSIS — I639 Cerebral infarction, unspecified: Secondary | ICD-10-CM

## 2021-09-17 NOTE — Assessment & Plan Note (Signed)
Duplex today shows a widely patent right carotid stent with 1 to 39% left ICA stenosis.  Doing well after right carotid stent placement for moderate stenosis with recent symptoms.  Continue dual antiplatelet therapy and statin agent.  Recheck in 3 months with duplex.

## 2021-09-17 NOTE — Assessment & Plan Note (Signed)
Likely secondary to embolic phenomenon from right carotid stenosis.  No recurrent symptoms after intervention.

## 2021-09-17 NOTE — Progress Notes (Signed)
Patient ID: Michael Chung, male   DOB: 1961/11/06, 60 y.o.   MRN: [id number]  Chief Complaint  Patient presents with   Follow-up    [facility] 2wk post carotis stent    HPI Michael Chung is a 60 y.o. male.  Patient returns about a month after right carotid stent placement for moderate stenosis with recent stroke.  He is doing well.  He had no periprocedural complications.  His symptoms from the stroke could completely resolved at the time of the procedure.  No recurrent focal neurologic symptoms.  Duplex today shows a widely patent right carotid stent with 1 to 39% left ICA stenosis.   Past Medical History:  Diagnosis Date   Arthritis    Coffee ground emesis    2016 after nsaid use   Dupuytren contracture 06/2013   right long, ring, small fingers   GERD (gastroesophageal reflux disease)    uses OTC as needed    Past Surgical History:  Procedure Laterality Date   CAROTID PTA/STENT INTERVENTION Right 08/16/2021   Procedure: CAROTID PTA/STENT INTERVENTION;  Surgeon: Annice Needy, MD;  Location: [facility];  Service: Cardiovascular;  Laterality: Right;   DUPUYTREN CONTRACTURE RELEASE Right 06/27/2013   Procedure: EXCISION DUPUYTRENS RIGHT LONG AND RING  FINGERS;  Surgeon: Wyn Forster., MD;  Location: [facility];  Service: Orthopedics;  Laterality: Right;   TOTAL HIP ARTHROPLASTY Left 11/25/2015   Procedure: TOTAL HIP ARTHROPLASTY;  Surgeon: Deeann Saint, MD;  Location: [facility];  Service: Orthopedics;  Laterality: Left;   TOTAL HIP ARTHROPLASTY Right    2020      Allergies  Allergen Reactions   Citrus Anaphylaxis    Tongue swelling with eating oranges   Nsaids Other (See Comments)    H/o coffee ground emesis    Current Outpatient Medications  Medication Sig Dispense Refill   amLODipine (NORVASC) 10 MG tablet Take 1 tablet (10 mg total) by mouth daily. 90 tablet 3   aspirin EC 81 MG EC tablet Take 1 tablet (81 mg total) by mouth daily at 6 (six)  AM. Swallow whole. 30 tablet 0   atorvastatin (LIPITOR) 80 MG tablet Take 1 tablet (80 mg total) by mouth at bedtime. 90 tablet 3   clopidogrel (PLAVIX) 75 MG tablet Take 1 tablet (75 mg total) by mouth daily. 90 tablet 1   cyclobenzaprine (FLEXERIL) 10 MG tablet TAKE 1 TABLET (10 MG TOTAL) BY MOUTH DAILY AS NEEDED FOR MUSCLE SPASMS (SEDATION CAUTION.). 30 tablet 3   hydrochlorothiazide (HYDRODIURIL) 12.5 MG tablet Take 1 tablet (12.5 mg total) by mouth daily. 90 tablet 3   pantoprazole (PROTONIX) 20 MG tablet TAKE 1 TABLET BY MOUTH EVERY DAY 90 tablet 3   No current facility-administered medications for this visit.        Physical Exam BP (!) 155/93 (BP Location: Left Arm)   Pulse 73   Resp 16   Wt 227 lb (103 kg)   BMI 29.95 kg/m  Gen:  WD/WN, NAD Skin: incision C/D/I     Assessment/Plan:  Stroke (HCC) Likely secondary to embolic phenomenon from right carotid stenosis.  No recurrent symptoms after intervention.  Hypertension blood pressure control important in reducing the progression of atherosclerotic disease. On appropriate oral medications.   Carotid stenosis Duplex today shows a widely patent right carotid stent with 1 to 39% left ICA stenosis.  Doing well after right carotid stent placement for moderate stenosis with recent symptoms.  Continue  dual antiplatelet therapy and statin agent.  Recheck in 3 months with duplex.      Michael Chung 09/17/2021, 10:46 AM   This note was created with Dragon medical transcription system.  Any errors from dictation are unintentional.

## 2021-09-17 NOTE — Assessment & Plan Note (Signed)
blood pressure control important in reducing the progression of atherosclerotic disease. On appropriate oral medications.  

## 2021-11-29 ENCOUNTER — Other Ambulatory Visit: Payer: Self-pay | Admitting: Family Medicine

## 2021-12-17 ENCOUNTER — Ambulatory Visit (INDEPENDENT_AMBULATORY_CARE_PROVIDER_SITE_OTHER): Payer: 59

## 2021-12-17 ENCOUNTER — Other Ambulatory Visit: Payer: Self-pay

## 2021-12-17 ENCOUNTER — Encounter (INDEPENDENT_AMBULATORY_CARE_PROVIDER_SITE_OTHER): Payer: Self-pay | Admitting: Nurse Practitioner

## 2021-12-17 ENCOUNTER — Ambulatory Visit (INDEPENDENT_AMBULATORY_CARE_PROVIDER_SITE_OTHER): Payer: 59 | Admitting: Nurse Practitioner

## 2021-12-17 VITALS — BP 158/94 | HR 64 | Resp 16 | Wt 237.8 lb

## 2021-12-17 DIAGNOSIS — N529 Male erectile dysfunction, unspecified: Secondary | ICD-10-CM

## 2021-12-17 DIAGNOSIS — F17200 Nicotine dependence, unspecified, uncomplicated: Secondary | ICD-10-CM

## 2021-12-17 DIAGNOSIS — I6523 Occlusion and stenosis of bilateral carotid arteries: Secondary | ICD-10-CM | POA: Diagnosis not present

## 2021-12-17 DIAGNOSIS — R69 Illness, unspecified: Secondary | ICD-10-CM | POA: Diagnosis not present

## 2021-12-17 DIAGNOSIS — I10 Essential (primary) hypertension: Secondary | ICD-10-CM | POA: Diagnosis not present

## 2021-12-26 ENCOUNTER — Encounter (INDEPENDENT_AMBULATORY_CARE_PROVIDER_SITE_OTHER): Payer: Self-pay | Admitting: Nurse Practitioner

## 2021-12-26 IMAGING — CT CT ANGIO HEAD
2 of 8 series · 8 of 33 positions shown · IV contrast (APPLIED)
Comparison: Brain MRI and intracranial MRA 08/14/2021.

CLINICAL DATA: 59-year-old male with neurologic deficit. Small
right frontal lobe white matter lacunar infarct on brain MRI
yesterday.

EXAM:
CT ANGIOGRAPHY HEAD AND NECK
TECHNIQUE: Multidetector CT imaging of the head and neck was performed using
the standard protocol during bolus administration of intravenous
contrast. Multiplanar CT image reconstructions and MIPs were
obtained to evaluate the vascular anatomy. Carotid stenosis
measurements (when applicable) are obtained utilizing NASCET
criteria, using the distal internal carotid diameter as the
denominator.
CONTRAST:  75mL OMNIPAQUE IOHEXOL 350 MG/ML SOLN

[Series 5: cta head neck thins · axial · 0.53mm/px · z∈[-294,-20]mm · 6 of 768 slices shown]
[im 110/768  soft-tissue]
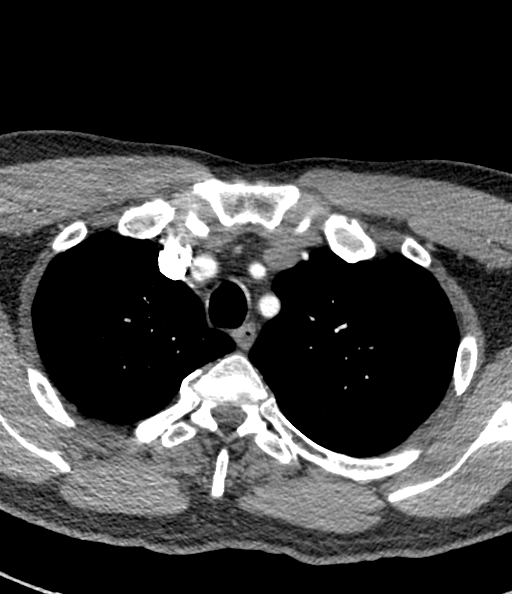
[im 220/768  bone]
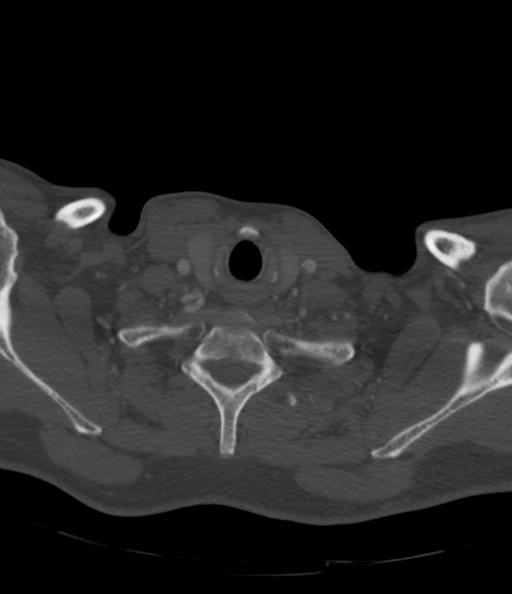
[im 329/768  soft-tissue]
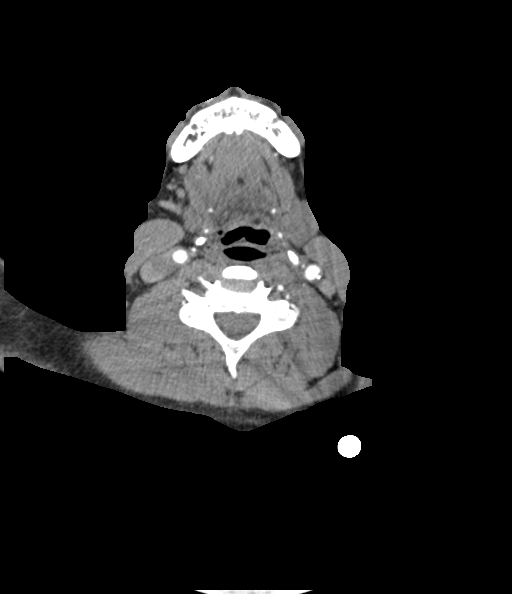
[im 439/768  bone]
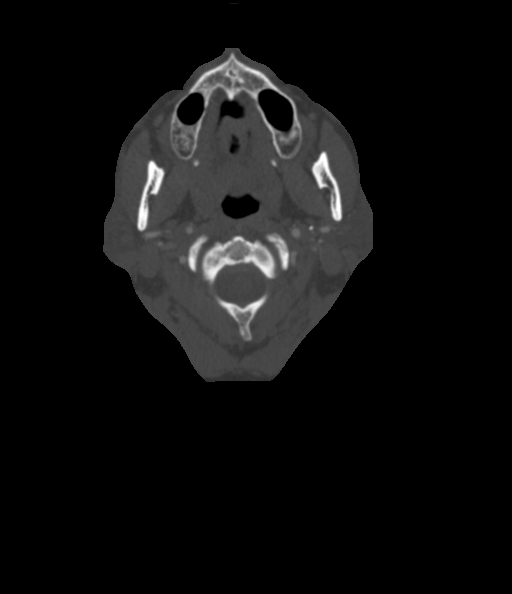
[im 548/768  soft-tissue]
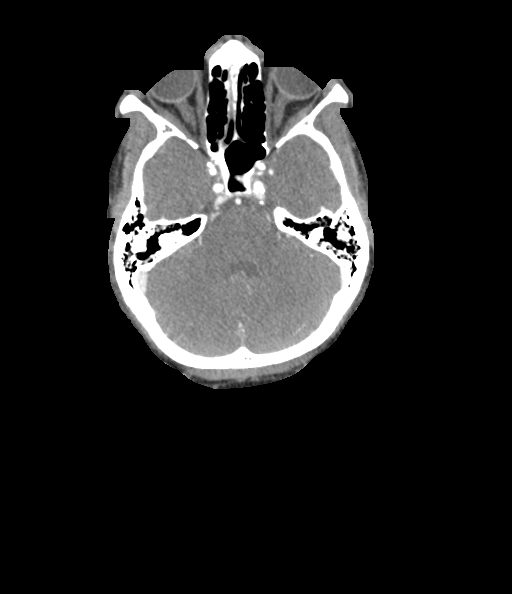
[im 658/768  bone]
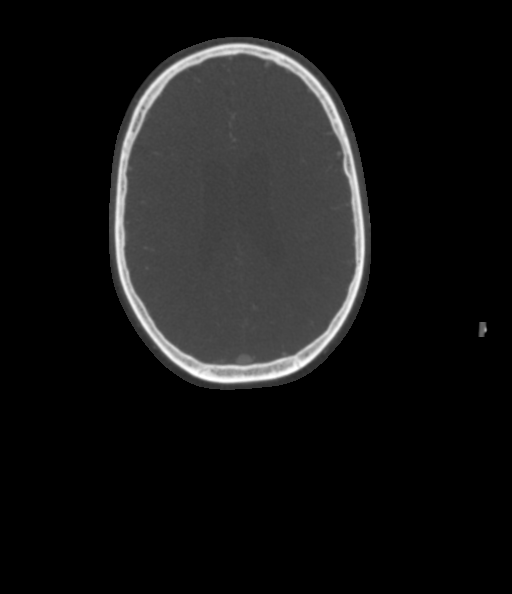

[Series 6: ax thin · axial · 0.51mm/px · z∈[-220,-93]mm · 2 of 383 slices shown]
[im 128/383  soft-tissue]
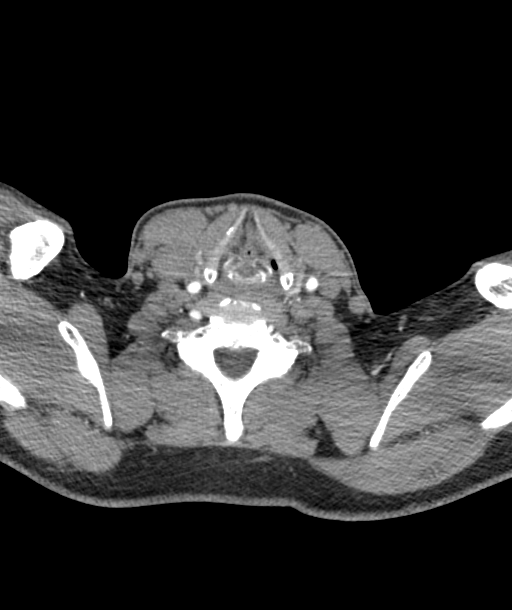
[im 255/383  soft-tissue]
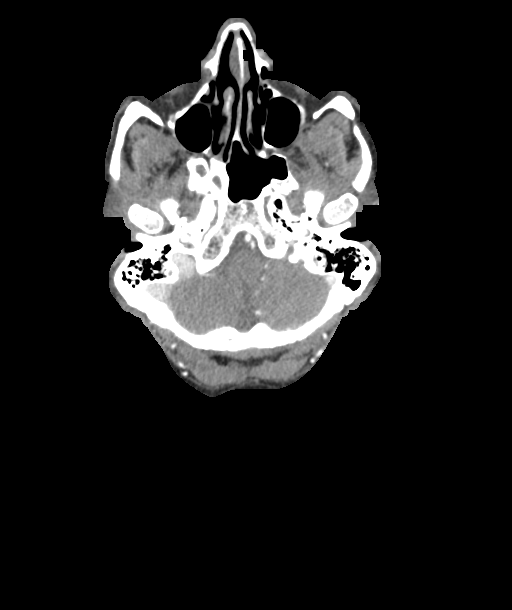

[8 of 33 positions shown; findings below may reference images not displayed]

FINDINGS: CTA NECK

Skeleton: No acute osseous abnormality identified. Mild for age
degeneration in the spine.

Upper chest: Centrilobular and paraseptal emphysema, moderate.

Other neck: Negative, no neck mass or lymphadenopathy.

Aortic arch: 3 vessel arch configuration. Mild arch atherosclerosis.

Right carotid system: Negative brachiocephalic artery. Soft plaque
in the right CCA anteriorly but no stenosis before the bifurcation.
Complex moderate soft and calcified plaque at the right ICA origin
and bulb results in stenosis up to 60 % with respect to the distal
vessel at the distal bulb (series 4, image 109). Proximal that the
plaque is somewhat ulcerated (series 7, image 125). The right ICA
remains patent to the skull base.

Left carotid system: Left CCA soft and calcified plaque which
continues to the left carotid bifurcation. Left ICA origin and
distal left ICA involvement just below the skull base but less than
50 % stenosis with respect to the distal vessel.

Vertebral arteries:
Proximal right subclavian atherosclerosis without stenosis.
Calcified plaque near the right vertebral artery origin but no
origin stenosis. Tortuous right V1 segment. The right V2 segment is
also tortuous and the right vertebral is patent to the skull base
without stenosis.

Soft and calcified plaque in the proximal left subclavian artery
without stenosis. Non dominant left vertebral artery has a normal
origin. Tortuous left vertebral remains non dominant and is patent
to the skull base without stenosis.

CTA HEAD

Posterior circulation: Dominant right V4 segment. No significant
distal vertebral plaque. No distal vertebral stenosis. Normal left
PICA origin. Dominant right AICA is patent. Patent vertebrobasilar
junction and basilar artery without stenosis. Patent SCA and PCA
origins with bilateral posterior communicating arteries. Bilateral
PCA branches are within normal limits.

Anterior circulation: Both ICA siphons are patent but heavily
calcified. There is both soft and calcified plaque of the left ICA
siphon (soft plaque in the petrous segment) with moderate left
cavernous ICA stenosis (series 6, image 106). Normal left ophthalmic
and posterior communicating arteries. Similar right ICA siphon
plaque and moderate to severe stenosis at the anterior genu (series
8, image 66) where decreased flow signal is noted on the MRA
yesterday. Right ophthalmic and posterior communicating artery
origins remain normal.

Both carotid termini remain patent. Patent MCA and ACA origins with
dominant left A1 segment. Anterior communicating artery and
bilateral ACA branches are within normal limits. Left MCA M1 segment
and trifurcation are patent without stenosis. Left MCA branches are
within normal limits. Right MCA M1 segment and bifurcation are
patent without stenosis. Right MCA branches are within normal
limits.

Venous sinuses: Patent. The right transverse and sigmoid sinus
appear dominant.

Anatomic variants: Dominant right vertebral artery.

Review of the MIP images confirms the above findings
IMPRESSION: 1. Negative for large vessel occlusion.

2. Positive for widespread atherosclerosis in the head and neck:
Heavily calcified ICA siphons with moderate to severe Right ICA
anterior genu stenosis.
Moderate Left cavernous ICA stenosis.
Complex plaque at the Right ICA bulb is partially ulcerated and
results in up to 60% stenosis.

3. But no significant vertebral artery or posterior circulation
stenosis.

4. Aortic Atherosclerosis (TCFE8-GK5.5) and Emphysema (TCFE8-AKL.P).

## 2021-12-26 IMAGING — CT CT ANGIO NECK
2 of 7 series · 8 of 33 positions shown · IV contrast (APPLIED)
Comparison: Brain MRI and intracranial MRA 08/14/2021.

CLINICAL DATA: 59-year-old male with neurologic deficit. Small
right frontal lobe white matter lacunar infarct on brain MRI
yesterday.

EXAM:
CT ANGIOGRAPHY HEAD AND NECK
TECHNIQUE: Multidetector CT imaging of the head and neck was performed using
the standard protocol during bolus administration of intravenous
contrast. Multiplanar CT image reconstructions and MIPs were
obtained to evaluate the vascular anatomy. Carotid stenosis
measurements (when applicable) are obtained utilizing NASCET
criteria, using the distal internal carotid diameter as the
denominator.
CONTRAST:  75mL OMNIPAQUE IOHEXOL 350 MG/ML SOLN

[Series 4: cta head neck · axial · 0.56mm/px · z∈[-209,-79]mm · 2 of 197 slices shown]
[im 66/197  soft-tissue]
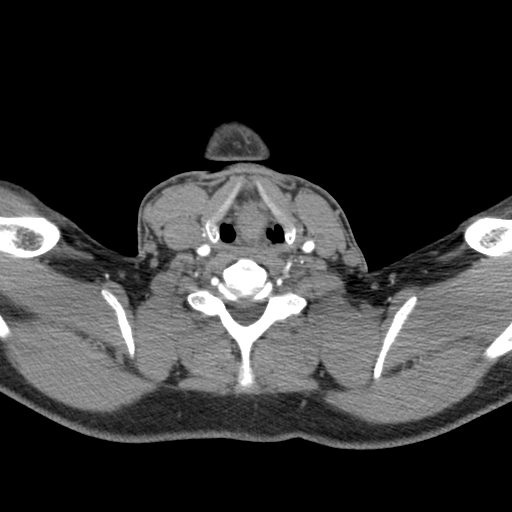
[im 131/197  soft-tissue]
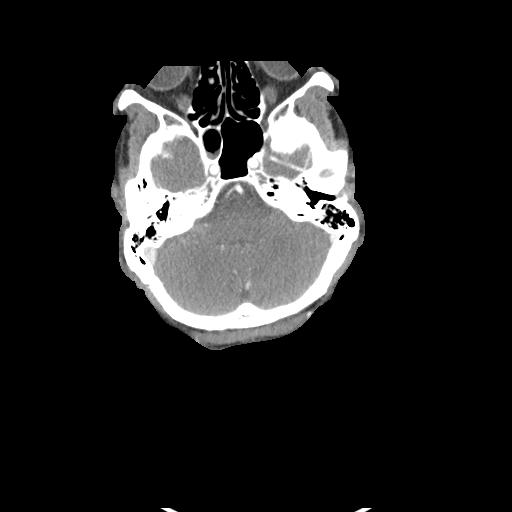

[Series 6: ax thin · axial · 0.51mm/px · z∈[-293,-20]mm · 6 of 383 slices shown]
[im 55/383  soft-tissue]
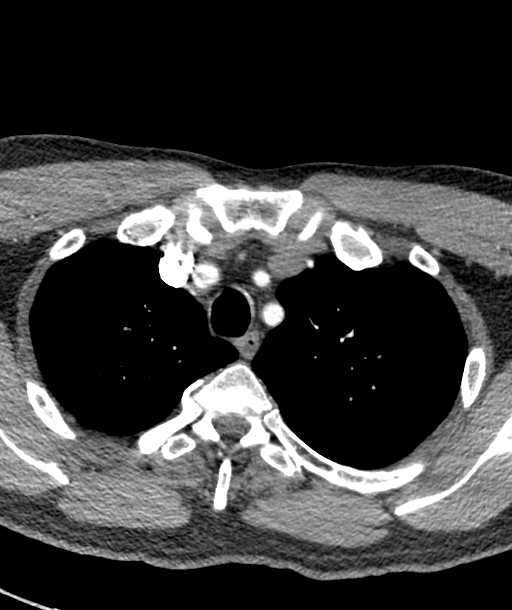
[im 110/383  bone]
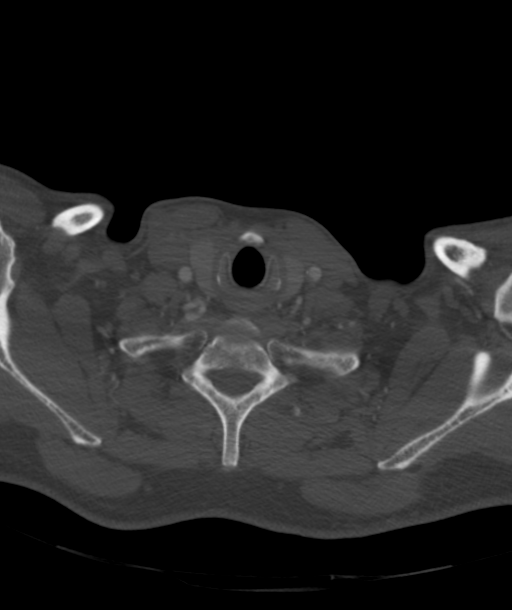
[im 164/383  soft-tissue]
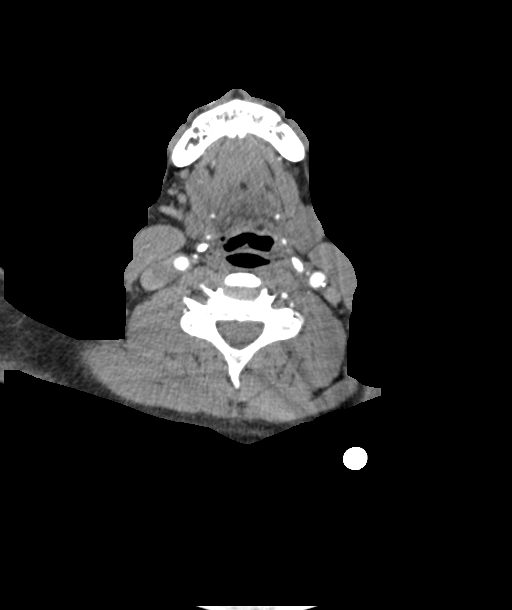
[im 219/383  bone]
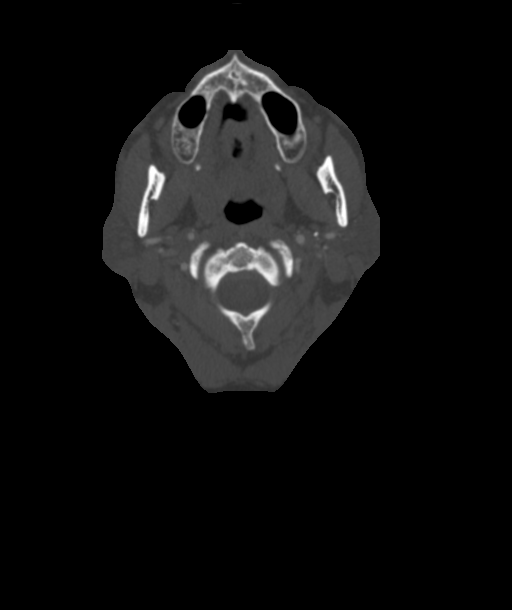
[im 273/383  soft-tissue]
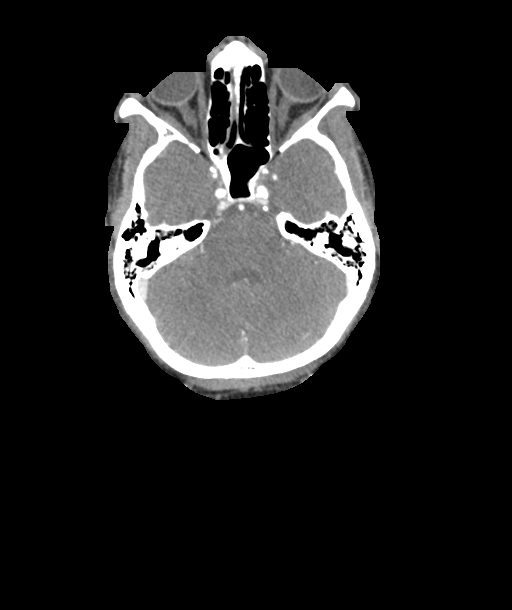
[im 328/383  bone]
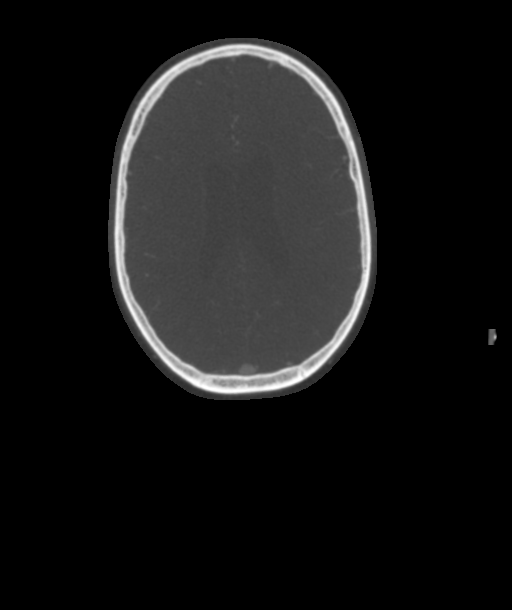

[8 of 33 positions shown; findings below may reference images not displayed]

FINDINGS: CTA NECK

Skeleton: No acute osseous abnormality identified. Mild for age
degeneration in the spine.

Upper chest: Centrilobular and paraseptal emphysema, moderate.

Other neck: Negative, no neck mass or lymphadenopathy.

Aortic arch: 3 vessel arch configuration. Mild arch atherosclerosis.

Right carotid system: Negative brachiocephalic artery. Soft plaque
in the right CCA anteriorly but no stenosis before the bifurcation.
Complex moderate soft and calcified plaque at the right ICA origin
and bulb results in stenosis up to 60 % with respect to the distal
vessel at the distal bulb (series 4, image 109). Proximal that the
plaque is somewhat ulcerated (series 7, image 125). The right ICA
remains patent to the skull base.

Left carotid system: Left CCA soft and calcified plaque which
continues to the left carotid bifurcation. Left ICA origin and
distal left ICA involvement just below the skull base but less than
50 % stenosis with respect to the distal vessel.

Vertebral arteries:
Proximal right subclavian atherosclerosis without stenosis.
Calcified plaque near the right vertebral artery origin but no
origin stenosis. Tortuous right V1 segment. The right V2 segment is
also tortuous and the right vertebral is patent to the skull base
without stenosis.

Soft and calcified plaque in the proximal left subclavian artery
without stenosis. Non dominant left vertebral artery has a normal
origin. Tortuous left vertebral remains non dominant and is patent
to the skull base without stenosis.

CTA HEAD

Posterior circulation: Dominant right V4 segment. No significant
distal vertebral plaque. No distal vertebral stenosis. Normal left
PICA origin. Dominant right AICA is patent. Patent vertebrobasilar
junction and basilar artery without stenosis. Patent SCA and PCA
origins with bilateral posterior communicating arteries. Bilateral
PCA branches are within normal limits.

Anterior circulation: Both ICA siphons are patent but heavily
calcified. There is both soft and calcified plaque of the left ICA
siphon (soft plaque in the petrous segment) with moderate left
cavernous ICA stenosis (series 6, image 106). Normal left ophthalmic
and posterior communicating arteries. Similar right ICA siphon
plaque and moderate to severe stenosis at the anterior genu (series
8, image 66) where decreased flow signal is noted on the MRA
yesterday. Right ophthalmic and posterior communicating artery
origins remain normal.

Both carotid termini remain patent. Patent MCA and ACA origins with
dominant left A1 segment. Anterior communicating artery and
bilateral ACA branches are within normal limits. Left MCA M1 segment
and trifurcation are patent without stenosis. Left MCA branches are
within normal limits. Right MCA M1 segment and bifurcation are
patent without stenosis. Right MCA branches are within normal
limits.

Venous sinuses: Patent. The right transverse and sigmoid sinus
appear dominant.

Anatomic variants: Dominant right vertebral artery.

Review of the MIP images confirms the above findings
IMPRESSION: 1. Negative for large vessel occlusion.

2. Positive for widespread atherosclerosis in the head and neck:
Heavily calcified ICA siphons with moderate to severe Right ICA
anterior genu stenosis.
Moderate Left cavernous ICA stenosis.
Complex plaque at the Right ICA bulb is partially ulcerated and
results in up to 60% stenosis.

3. But no significant vertebral artery or posterior circulation
stenosis.

4. Aortic Atherosclerosis (TCFE8-GK5.5) and Emphysema (TCFE8-AKL.P).

## 2021-12-26 NOTE — Progress Notes (Signed)
Subjective:    Patient ID: Michael Chung, male    DOB: Jan 04, 1961, 61 y.o.   MRN: [id number] Chief Complaint  Patient presents with   Follow-up    Ultrasound follow up    The patient is seen for follow up evaluation of carotid stenosis status post right carotid stent placement on 08/16/2021.  There were no post operative problems or complications related to the surgery.  The patient denies neck or incisional pain.  The patient denies interval amaurosis fugax. There is no recent history of TIA symptoms or focal motor deficits. There is no prior documented CVA.  The patient denies headache.  The patient is taking enteric-coated aspirin 81 mg daily.  The patient has a history of coronary artery disease, no recent episodes of angina or shortness of breath. The patient denies PAD or claudication symptoms. There is a history of hyperlipidemia which is being treated with a statin.    The right extracranial vessels are normal with only minimal wall thickening or plaque.  The right ICA stent is open and patent.  Left also has extracranial vessels are normal with only minimal wall thickening or plaque.  Bilateral velocities are in the 1 to 39% range.  The bilateral vertebral arteries demonstrate antegrade flow with normal flow hemodynamics in the bilateral subclavian arteries.   Review of Systems  All other systems reviewed and are negative.     Objective:   Physical Exam Vitals reviewed.  HENT:     Head: Normocephalic.  Neck:     Vascular: No carotid bruit.  Cardiovascular:     Rate and Rhythm: Normal rate.     Pulses: Normal pulses.  Pulmonary:     Effort: Pulmonary effort is normal.  Skin:    General: Skin is warm and dry.  Neurological:     Mental Status: He is alert and oriented to person, place, and time.  Psychiatric:        Mood and Affect: Mood normal.        Behavior: Behavior normal.        Thought Content: Thought content normal.        Judgment: Judgment normal.     BP (!) 158/94 (BP Location: Left Arm)    Pulse 64    Resp 16    Wt 237 lb 12.8 oz (107.9 kg)    BMI 31.37 kg/m   Past Medical History:  Diagnosis Date   Arthritis    Coffee ground emesis    2016 after nsaid use   Dupuytren contracture 06/2013   right long, ring, small fingers   GERD (gastroesophageal reflux disease)    uses OTC as needed    Social History   Socioeconomic History   Marital status: Married    Spouse name: Not on file   Number of children: Not on file   Years of education: Not on file   Highest education level: Not on file  Occupational History   Occupation: Administrator[employer]  Tobacco Use   Smoking status: Former    Packs/day: 1.00    Years: 20.00    Pack years: 20.00    Types: Cigarettes   Smokeless tobacco: Never  Substance and Sexual Activity   Alcohol use: No    Alcohol/week: 0.0 standard drinks   Drug use: No   Sexual activity: Not on file  Other Topics Concern   Not on file  Social History Narrative   Education:  11th grade   Works at [employer]  Married 1987   Enjoys singing   Social Determinants of Radio broadcast assistant Strain: Not on file  Food Insecurity: Not on file  Transportation Needs: Not on file  Physical Activity: Not on file  Stress: Not on file  Social Connections: Not on file  Intimate Partner Violence: Not on file    Past Surgical History:  Procedure Laterality Date   CAROTID PTA/STENT INTERVENTION Right 08/16/2021   Procedure: CAROTID PTA/STENT INTERVENTION;  Surgeon: Algernon Huxley, MD;  Location: [place] [facility];  Service: Cardiovascular;  Laterality: Right;   DUPUYTREN CONTRACTURE RELEASE Right 06/27/2013   Procedure: EXCISION DUPUYTRENS RIGHT LONG AND RING  FINGERS;  Surgeon: Cammie Sickle., MD;  Location: [place];  Service: Orthopedics;  Laterality: Right;   TOTAL HIP ARTHROPLASTY Left 11/25/2015   Procedure: TOTAL HIP ARTHROPLASTY;  Surgeon: Earnestine Leys, MD;  Location: [facility];  Service: Orthopedics;  Laterality: Left;   TOTAL HIP ARTHROPLASTY Right    2020    Family History  Problem Relation Age of Onset   Hypertension Father    Diabetes Father    Heart Problems Father    Diabetes Mother    Hypertension Mother    Dementia Mother    Colon cancer Brother    Colon cancer Brother    Prostate cancer Neg Hx     Allergies  Allergen Reactions   Citrus Anaphylaxis    Tongue swelling with eating oranges   Nsaids Other (See Comments)    H/o coffee ground emesis    CBC Latest Ref Rng & Units 08/27/2021 08/17/2021 08/16/2021  WBC 3.8 - 10.8 Thousand/uL 5.1 4.8 4.2  Hemoglobin 13.2 - 17.1 g/dL 12.8(L) 13.4 13.5  Hematocrit 38.5 - 50.0 % 38.3(L) 39.9 39.7  Platelets 140 - 400 Thousand/uL 235 190 214      CMP     Component Value Date/Time   NA 141 08/27/2021 1607   K 3.9 08/27/2021 1607   CL 106 08/27/2021 1607   CO2 26 08/27/2021 1607   GLUCOSE 115 (H) 08/27/2021 1607   BUN 18 08/27/2021 1607   CREATININE 1.06 08/27/2021 1607   CALCIUM 9.6 08/27/2021 1607   PROT 8.2 (H) 08/14/2021 1454   ALBUMIN 4.6 08/14/2021 1454   AST 20 08/14/2021 1454   ALT 13 08/14/2021 1454   ALKPHOS 60 08/14/2021 1454   BILITOT 0.7 08/14/2021 1454   GFRNONAA >60 08/17/2021 0622   GFRAA >60 11/26/2015 0601     No results found.     Assessment & Plan:   1. Bilateral carotid artery stenosis Recommend:  The patient is s/p successful right ICA stent  Duplex ultrasound preoperatively shows minimal contralateral stenosis.  Continue antiplatelet therapy as prescribed Continue management of CAD, HTN and Hyperlipidemia Healthy heart diet,  encouraged exercise at least 4 times per week  Follow up in 6 months with duplex ultrasound and physical exam based on the patient's carotid surgery   2. Erectile dysfunction, unspecified erectile dysfunction type He discussed possible causes of erectile dysfunction.  Given that patient is a current smoker and has several risk  factors for PAD as well as with his carotid artery stenosis it is possible that it could be vasculogenic in cause.  We will have the patient return with ABIs and aortoiliac duplex at his convenience for evaluation.  Patient is also advised to follow-up with PCP as this may not be vascular genic in nature.  3. Smoker Smoking cessation was discussed, 3-10  minutes spent on this topic specifically   4. Primary hypertension Continue antihypertensive medications as already ordered, these medications have been reviewed and there are no changes at this time.    Current Outpatient Medications on File Prior to Visit  Medication Sig Dispense Refill   amLODipine (NORVASC) 10 MG tablet Take 1 tablet (10 mg total) by mouth daily. 90 tablet 3   aspirin EC 81 MG EC tablet Take 1 tablet (81 mg total) by mouth daily at 6 (six) AM. Swallow whole. 30 tablet 0   atorvastatin (LIPITOR) 80 MG tablet Take 1 tablet (80 mg total) by mouth at bedtime. 90 tablet 3   clopidogrel (PLAVIX) 75 MG tablet Take 1 tablet (75 mg total) by mouth daily. 90 tablet 1   cyclobenzaprine (FLEXERIL) 10 MG tablet TAKE 1 TABLET (10 MG TOTAL) BY MOUTH DAILY AS NEEDED FOR MUSCLE SPASMS (SEDATION CAUTION.). 30 tablet 1   hydrochlorothiazide (HYDRODIURIL) 12.5 MG tablet Take 1 tablet (12.5 mg total) by mouth daily. 90 tablet 3   pantoprazole (PROTONIX) 20 MG tablet TAKE 1 TABLET BY MOUTH EVERY DAY 90 tablet 3   No current facility-administered medications on file prior to visit.    There are no Patient Instructions on file for this visit. No follow-ups on file.   Kris Hartmann, NP

## 2021-12-28 ENCOUNTER — Other Ambulatory Visit (INDEPENDENT_AMBULATORY_CARE_PROVIDER_SITE_OTHER): Payer: Self-pay | Admitting: Vascular Surgery

## 2021-12-28 DIAGNOSIS — M79672 Pain in left foot: Secondary | ICD-10-CM

## 2021-12-29 ENCOUNTER — Telehealth (INDEPENDENT_AMBULATORY_CARE_PROVIDER_SITE_OTHER): Payer: Self-pay

## 2021-12-29 ENCOUNTER — Other Ambulatory Visit (INDEPENDENT_AMBULATORY_CARE_PROVIDER_SITE_OTHER): Payer: Self-pay | Admitting: Vascular Surgery

## 2021-12-29 ENCOUNTER — Ambulatory Visit (INDEPENDENT_AMBULATORY_CARE_PROVIDER_SITE_OTHER): Payer: 59

## 2021-12-29 ENCOUNTER — Encounter (INDEPENDENT_AMBULATORY_CARE_PROVIDER_SITE_OTHER): Payer: Self-pay | Admitting: Nurse Practitioner

## 2021-12-29 ENCOUNTER — Other Ambulatory Visit (INDEPENDENT_AMBULATORY_CARE_PROVIDER_SITE_OTHER): Payer: Self-pay | Admitting: Nurse Practitioner

## 2021-12-29 ENCOUNTER — Other Ambulatory Visit: Payer: Self-pay

## 2021-12-29 ENCOUNTER — Ambulatory Visit (INDEPENDENT_AMBULATORY_CARE_PROVIDER_SITE_OTHER): Payer: 59 | Admitting: Nurse Practitioner

## 2021-12-29 VITALS — BP 165/111 | HR 73 | Resp 16 | Wt 238.6 lb

## 2021-12-29 DIAGNOSIS — I10 Essential (primary) hypertension: Secondary | ICD-10-CM | POA: Diagnosis not present

## 2021-12-29 DIAGNOSIS — N529 Male erectile dysfunction, unspecified: Secondary | ICD-10-CM

## 2021-12-29 DIAGNOSIS — I998 Other disorder of circulatory system: Secondary | ICD-10-CM | POA: Diagnosis not present

## 2021-12-29 NOTE — H&P (Signed)
Subjective:    Patient ID: Michael Chung, male    DOB: 1961-06-25, 61 y.o.   MRN: [id number] Chief Complaint  Patient presents with   Follow-up    Ultrasound follow up    Michael Chung is a 61 year old male that presents today for evaluation of lower extremity perfusion due to having issues with erectile dysfunction.  The patient has several notable risk factors for peripheral arterial disease including previous smoking history, hypertension, and hyperlipidemia.  The patient recently had a carotid stent placed in the right ICA.  That has been doing well and he has been compliant on his Plavix and aspirin.  He currently denies any open wounds or ulcerations.  He denies rest pain.  He does have some claudication after ambulating for longer distances or up inclines.  The patient's wife is understandably concerned that his left lower extremity is also smaller than his right.  Today noninvasive studies show an ABI of 0.25 on the right with an ABI of 0.56 on the left.  The right TBI 0.87 on the left being 0.16.  The patient has dampened monophasic waveforms in the right lower extremity however toe waveforms are acceptable.  The left however has monophasic/dampened monophasic waveforms in the tibial arteries with a nearly flat toe waveform.  Additionally, aortoiliac duplex is done.  It is noted that the patient has an occluded left external iliac artery as well as common femoral artery.  The patient has blunted waveforms near the left common iliac proximal.  The right iliac arteries have biphasic/triphasic waveforms   Review of Systems  Musculoskeletal:  Positive for gait problem.  All other systems reviewed and are negative.     Objective:   Physical Exam Vitals reviewed.  HENT:     Head: Normocephalic.  Cardiovascular:     Rate and Rhythm: Normal rate.     Pulses:          Dorsalis pedis pulses are detected w/ Doppler on the right side and 0 on the left side.       Posterior tibial pulses are  detected w/ Doppler on the right side and 0 on the left side.  Pulmonary:     Effort: Pulmonary effort is normal.  Skin:    General: Skin is dry.     Capillary Refill: Capillary refill takes 2 to 3 seconds.     Comments: Cool near the toes with the left being worse than the right  Neurological:     Mental Status: He is alert and oriented to person, place, and time.  Psychiatric:        Mood and Affect: Mood normal.        Behavior: Behavior normal.        Thought Content: Thought content normal.        Judgment: Judgment normal.    BP (!) 165/111 (BP Location: Left Arm)    Pulse 73    Resp 16    Wt 238 lb 9.6 oz (108.2 kg)    BMI 31.48 kg/m   Past Medical History:  Diagnosis Date   Arthritis    Coffee ground emesis    2016 after nsaid use   Dupuytren contracture 06/2013   right long, ring, small fingers   GERD (gastroesophageal reflux disease)    uses OTC as needed    Social History   Socioeconomic History   Marital status: Married    Spouse name: Not on file   Number of children: Not on  file   Years of education: Not on file   Highest education level: Not on file  Occupational History   Occupation: Occupational hygienist  Tobacco Use   Smoking status: Former    Packs/day: 1.00    Years: 20.00    Pack years: 20.00    Types: Cigarettes   Smokeless tobacco: Never  Substance and Sexual Activity   Alcohol use: No    Alcohol/week: 0.0 standard drinks   Drug use: No   Sexual activity: Not on file  Other Topics Concern   Not on file  Social History Narrative   Education:  11th grade   Works at [employer]   Enjoys singing   Social Determinants of Health   Financial Resource Strain: Not on file  Food Insecurity: Not on file  Transportation Needs: Not on file  Physical Activity: Not on file  Stress: Not on file  Social Connections: Not on file  Intimate Partner Violence: Not on file    Past Surgical History:  Procedure Laterality Date   CAROTID  PTA/STENT INTERVENTION Right 08/16/2021   Procedure: CAROTID PTA/STENT INTERVENTION;  Surgeon: Algernon Huxley, MD;  Location: [place] [facility];  Service: Cardiovascular;  Laterality: Right;   DUPUYTREN CONTRACTURE RELEASE Right 06/27/2013   Procedure: EXCISION DUPUYTRENS RIGHT LONG AND RING  FINGERS;  Surgeon: Cammie Sickle., MD;  Location: [place];  Service: Orthopedics;  Laterality: Right;   TOTAL HIP ARTHROPLASTY Left 11/25/2015   Procedure: TOTAL HIP ARTHROPLASTY;  Surgeon: Earnestine Leys, MD;  Location: [facility];  Service: Orthopedics;  Laterality: Left;   TOTAL HIP ARTHROPLASTY Right    2020    Family History  Problem Relation Age of Onset   Hypertension Father    Diabetes Father    Heart Problems Father    Diabetes Mother    Hypertension Mother    Dementia Mother    Colon cancer Brother    Colon cancer Brother    Prostate cancer Neg Hx     Allergies  Allergen Reactions   Citrus Anaphylaxis    Tongue swelling with eating oranges   Nsaids Other (See Comments)    H/o coffee ground emesis    CBC Latest Ref Rng & Units 08/27/2021 08/17/2021 08/16/2021  WBC 3.8 - 10.8 Thousand/uL 5.1 4.8 4.2  Hemoglobin 13.2 - 17.1 g/dL 12.8(L) 13.4 13.5  Hematocrit 38.5 - 50.0 % 38.3(L) 39.9 39.7  Platelets 140 - 400 Thousand/uL 235 190 214      CMP     Component Value Date/Time   NA 141 08/27/2021 1607   K 3.9 08/27/2021 1607   CL 106 08/27/2021 1607   CO2 26 08/27/2021 1607   GLUCOSE 115 (H) 08/27/2021 1607   BUN 18 08/27/2021 1607   CREATININE 1.06 08/27/2021 1607   CALCIUM 9.6 08/27/2021 1607   PROT 8.2 (H) 08/14/2021 1454   ALBUMIN 4.6 08/14/2021 1454   AST 20 08/14/2021 1454   ALT 13 08/14/2021 1454   ALKPHOS 60 08/14/2021 1454   BILITOT 0.7 08/14/2021 1454   GFRNONAA >60 08/17/2021 0622   GFRAA >60 11/26/2015 0601     No results found.     Assessment & Plan:   1. Ischemic leg Recommend:  The patient has evidence of severe  atherosclerotic changes of both lower extremities with rest pain that is associated with preulcerative changes and impending tissue loss of the foot.  This represents a limb threatening ischemia and places the  patient at the risk for limb loss.  Patient should undergo angiography of the lower extremities with the hope for intervention for limb salvage.  The risks and benefits as well as the alternative therapies was discussed in detail with the patient.  All questions were answered.  Patient agrees to proceed with angiography.  The patient will follow up with me in the office after the procedure.       2. Erectile dysfunction, unspecified erectile dysfunction type This is likely the cause, once all revascularization is complete we can reevaluate.  3. Primary hypertension Continue antihypertensive medications as already ordered, these medications have been reviewed and there are no changes at this time.    Current Outpatient Medications on File Prior to Visit  Medication Sig Dispense Refill   amLODipine (NORVASC) 10 MG tablet Take 1 tablet (10 mg total) by mouth daily. 90 tablet 3   aspirin EC 81 MG EC tablet Take 1 tablet (81 mg total) by mouth daily at 6 (six) AM. Swallow whole. 30 tablet 0   atorvastatin (LIPITOR) 80 MG tablet Take 1 tablet (80 mg total) by mouth at bedtime. 90 tablet 3   clopidogrel (PLAVIX) 75 MG tablet Take 1 tablet (75 mg total) by mouth daily. 90 tablet 1   cyclobenzaprine (FLEXERIL) 10 MG tablet TAKE 1 TABLET (10 MG TOTAL) BY MOUTH DAILY AS NEEDED FOR MUSCLE SPASMS (SEDATION CAUTION.). 30 tablet 1   hydrochlorothiazide (HYDRODIURIL) 12.5 MG tablet Take 1 tablet (12.5 mg total) by mouth daily. 90 tablet 3   pantoprazole (PROTONIX) 20 MG tablet TAKE 1 TABLET BY MOUTH EVERY DAY 90 tablet 3   No current facility-administered medications on file prior to visit.    There are no Patient Instructions on file for this visit. No follow-ups on file.   Kris Hartmann,  NP

## 2021-12-29 NOTE — Telephone Encounter (Signed)
Spoke with the patient and he is scheduled with Dr. Wyn Quaker for a LLE angio on 01/06/22 with a 9:15 am arrival time to the [facility]. Pre-procedure instructions were discussed and will be mailed.

## 2021-12-29 NOTE — Progress Notes (Deleted)
  The note originally documented on this encounter has been moved the the encounter in which it belongs.  

## 2022-01-06 ENCOUNTER — Ambulatory Visit
Admission: RE | Admit: 2022-01-06 | Discharge: 2022-01-06 | Disposition: A | Discharge status: Home / Self Care | Payer: 59 | Attending: Vascular Surgery | Admitting: Vascular Surgery

## 2022-01-06 ENCOUNTER — Other Ambulatory Visit: Payer: Self-pay

## 2022-01-06 ENCOUNTER — Encounter
Admission: RE | Disposition: A | Discharge status: Home / Self Care | Payer: Self-pay | Source: Home / Self Care | Attending: Vascular Surgery

## 2022-01-06 ENCOUNTER — Other Ambulatory Visit (INDEPENDENT_AMBULATORY_CARE_PROVIDER_SITE_OTHER): Payer: Self-pay | Admitting: Nurse Practitioner

## 2022-01-06 ENCOUNTER — Encounter: Payer: Self-pay | Admitting: Vascular Surgery

## 2022-01-06 DIAGNOSIS — I10 Essential (primary) hypertension: Secondary | ICD-10-CM | POA: Diagnosis not present

## 2022-01-06 DIAGNOSIS — N529 Male erectile dysfunction, unspecified: Secondary | ICD-10-CM | POA: Diagnosis not present

## 2022-01-06 DIAGNOSIS — I998 Other disorder of circulatory system: Secondary | ICD-10-CM

## 2022-01-06 DIAGNOSIS — Z87891 Personal history of nicotine dependence: Secondary | ICD-10-CM | POA: Diagnosis not present

## 2022-01-06 DIAGNOSIS — Z7902 Long term (current) use of antithrombotics/antiplatelets: Secondary | ICD-10-CM | POA: Insufficient documentation

## 2022-01-06 DIAGNOSIS — I70212 Atherosclerosis of native arteries of extremities with intermittent claudication, left leg: Secondary | ICD-10-CM | POA: Diagnosis not present

## 2022-01-06 DIAGNOSIS — E785 Hyperlipidemia, unspecified: Secondary | ICD-10-CM | POA: Diagnosis not present

## 2022-01-06 DIAGNOSIS — Z7982 Long term (current) use of aspirin: Secondary | ICD-10-CM | POA: Insufficient documentation

## 2022-01-06 DIAGNOSIS — I70222 Atherosclerosis of native arteries of extremities with rest pain, left leg: Secondary | ICD-10-CM | POA: Diagnosis not present

## 2022-01-06 HISTORY — PX: LOWER EXTREMITY ANGIOGRAPHY: CATH118251

## 2022-01-06 LAB — CREATININE, SERUM
Creatinine, Ser: 1.01 mg/dL (ref 0.61–1.24)
GFR, Estimated: >60 mL/min (ref >60)

## 2022-01-06 LAB — BUN: BUN: 16 mg/dL (ref 6–20)

## 2022-01-06 SURGERY — LOWER EXTREMITY ANGIOGRAPHY
Anesthesia: Moderate Sedation | Laterality: Left

## 2022-01-06 MED ORDER — CEFAZOLIN SODIUM-DEXTROSE 2-4 GM/100ML-% IV SOLN
INTRAVENOUS | Status: AC
  Administered 2022-01-06: 2 g via INTRAVENOUS
  Filled 2022-01-06: qty 100

## 2022-01-06 MED ORDER — HEPARIN SODIUM (PORCINE) 1000 UNIT/ML IJ SOLN
INTRAMUSCULAR | Status: AC
  Filled 2022-01-06: qty 10

## 2022-01-06 MED ORDER — FENTANYL CITRATE PF 50 MCG/ML IJ SOSY
PREFILLED_SYRINGE | INTRAMUSCULAR | Status: AC
  Filled 2022-01-06: qty 1

## 2022-01-06 MED ORDER — DIPHENHYDRAMINE HCL 50 MG/ML IJ SOLN: 50.0000 mg | Freq: Once | INTRAMUSCULAR | Status: DC | PRN

## 2022-01-06 MED ORDER — MIDAZOLAM HCL 2 MG/ML PO SYRP: 8.0000 mg | ORAL_SOLUTION | Freq: Once | ORAL | Status: DC | PRN

## 2022-01-06 MED ORDER — IODIXANOL 320 MG/ML IV SOLN
INTRAVENOUS | Status: DC | PRN
  Administered 2022-01-06: 50 mL via INTRA_ARTERIAL

## 2022-01-06 MED ORDER — SODIUM CHLORIDE 0.9 % IV SOLN: INTRAVENOUS | Status: DC

## 2022-01-06 MED ORDER — FAMOTIDINE 20 MG PO TABS: 40.0000 mg | ORAL_TABLET | Freq: Once | ORAL | Status: DC | PRN

## 2022-01-06 MED ORDER — FENTANYL CITRATE (PF) 100 MCG/2ML IJ SOLN
INTRAMUSCULAR | Status: DC | PRN
  Administered 2022-01-06: 50 ug via INTRAVENOUS

## 2022-01-06 MED ORDER — ONDANSETRON HCL 4 MG/2ML IJ SOLN: 4.0000 mg | Freq: Four times a day (QID) | INTRAMUSCULAR | Status: DC | PRN

## 2022-01-06 MED ORDER — MIDAZOLAM HCL 2 MG/2ML IJ SOLN
INTRAMUSCULAR | Status: AC
  Filled 2022-01-06: qty 2

## 2022-01-06 MED ORDER — CEFAZOLIN SODIUM-DEXTROSE 2-4 GM/100ML-% IV SOLN: 2.0000 g | Freq: Once | INTRAVENOUS | Status: AC

## 2022-01-06 MED ORDER — CEFAZOLIN SODIUM-DEXTROSE 2-4 GM/100ML-% IV SOLN
INTRAVENOUS | Status: AC
  Filled 2022-01-06: qty 100

## 2022-01-06 MED ORDER — HYDROMORPHONE HCL 1 MG/ML IJ SOLN: 1.0000 mg | Freq: Once | INTRAMUSCULAR | Status: DC | PRN

## 2022-01-06 MED ORDER — METHYLPREDNISOLONE SODIUM SUCC 125 MG IJ SOLR: 125.0000 mg | Freq: Once | INTRAMUSCULAR | Status: DC | PRN

## 2022-01-06 MED ORDER — MIDAZOLAM HCL 2 MG/2ML IJ SOLN
INTRAMUSCULAR | Status: DC | PRN
  Administered 2022-01-06: 2 mg via INTRAVENOUS
  Administered 2022-01-06: 1 mg via INTRAVENOUS

## 2022-01-06 MED ORDER — HEPARIN SODIUM (PORCINE) 1000 UNIT/ML IJ SOLN
INTRAMUSCULAR | Status: DC | PRN
  Administered 2022-01-06: 4000 [IU] via INTRAVENOUS

## 2022-01-06 SURGICAL SUPPLY — 14 items
CATH ANGIO 5F PIGTAIL 65CM (CATHETERS) ×1 IMPLANT
CATH BEACON 5 .035 65 KMP TIP (CATHETERS) ×1 IMPLANT
CATH TEMPO 5F RIM 65CM (CATHETERS) ×1 IMPLANT
DEVICE STARCLOSE SE CLOSURE (Vascular Products) ×1 IMPLANT
DEVICE TORQUE .025-.038 (MISCELLANEOUS) ×1 IMPLANT
GLIDEWIRE ADV .035X260CM (WIRE) ×1 IMPLANT
GLIDEWIRE STIFF .35X180X3 HYDR (WIRE) ×1 IMPLANT
KIT ENCORE 26 ADVANTAGE (KITS) IMPLANT
PACK ANGIOGRAPHY (CUSTOM PROCEDURE TRAY) ×3 IMPLANT
SHEATH BRITE TIP 5FRX11 (SHEATH) ×2 IMPLANT
SYR MEDRAD MARK 7 150ML (SYRINGE) ×1 IMPLANT
TUBING CONTRAST HIGH PRESS 72 (TUBING) ×1 IMPLANT
WIRE GUIDERIGHT .035X150 (WIRE) ×1 IMPLANT
WIRE MAGIC TOR.035 180C (WIRE) ×1 IMPLANT

## 2022-01-06 NOTE — Op Note (Signed)
[facility]  Percutaneous Study/Intervention Procedural Note   Date of Surgery: 01/06/2022  Surgeon(s):Danie Diehl    Assistants:none  Pre-operative Diagnosis: PAD with claudication left lower extremity, erectile dysfunction  Post-operative diagnosis:  Same  Procedure(s) Performed:             1.  Ultrasound guidance for vascular access right femoral artery             2.  Catheter placement into left internal iliac artery and left external iliac artery from right femoral approach             3.  Aortogram and selective left lower extremity angiogram             4.  StarClose closure device right femoral artery  EBL: 5 cc  Contrast: 50 cc  Fluoro Time: 9.5 minutes  Moderate Conscious Sedation Time: approximately 38 minutes using 3 mg of Versed and 50 mcg of Fentanyl              Indications:  Patient is a 61 y.o.male with erectile dysfunction as well as some left leg pain although his biggest complaint is of erectile dysfunction. The patient has noninvasive study showing iliac artery occlusion and possible left common femoral occlusion. The patient is brought in for angiography for further evaluation and potential treatment. Risks and benefits are discussed and informed consent is obtained.   Procedure:  The patient was identified and appropriate procedural time out was performed.  The patient was then placed supine on the table and prepped and draped in the usual sterile fashion. Moderate conscious sedation was administered during a face to face encounter with the patient throughout the procedure with my supervision of the RN administering medicines and monitoring the patient's vital signs, pulse oximetry, telemetry and mental status throughout from the start of the procedure until the patient was taken to the recovery room. Ultrasound was used to evaluate the right common femoral artery.  It was patent .  A digital ultrasound image was acquired.  A Seldinger  needle was used to access the right common femoral artery under direct ultrasound guidance and a permanent image was performed.  A 0.035 J wire was advanced without resistance and a 5Fr sheath was placed.  Pigtail catheter was placed into the aorta and an AP aortogram was performed. This demonstrated normal renal arteries and normal aorta.  The right common, internal, and external iliac arteries had very mild disease.  The left common iliac artery was patent and the left hypogastric artery was the main outflow but at the primary bifurcation of the hypogastric artery, there was a highly calcific high-grade stenosis of greater than 95%.  The left external iliac artery was occluded as was the left common femoral artery.  I then initially got the advantage wire and the pigtail catheter in the internal iliac artery.  Selective imaging showed this critical lesion at the bifurcation of the internal iliac artery.  Using both a rim catheter and a Kumpe catheter as well as the advantage wire and a Glidewire, attempts were made to try to cross this severe lesion to try to improve his erectile dysfunction without success.  I then took the Kumpe catheter into the proximal left external iliac artery where imaging was performed to evaluate the left lower extremity.  The left external iliac artery occluded about 2 to 3 cm beyond its origin.  Imaging of the left lower extremity demonstrated occlusion of the left common femoral artery  and the origin of the profunda femoris artery.  The SFA was occluded throughout its course with reconstitution of a diseased popliteal artery above the knee that then reoccluded down to the origins of the anterior tibial arteries and tibioperoneal trunk with the peroneal artery and anterior tibial artery being runoff distally after the proximal occlusions.  This was clearly going to require surgical therapy with at least a femoral endarterectomy and stents proximal and distal to have any sort of attempt  at revascularization of the left lower extremity.  I elected to terminate the procedure. The sheath was removed and StarClose closure device was deployed in the right femoral artery with excellent hemostatic result. The patient was taken to the recovery room in stable condition having tolerated the procedure well.  Findings:               Aortogram:  This demonstrated normal renal arteries and normal aorta.  The right common, internal, and external iliac arteries had very mild disease.  The left common iliac artery was patent and the left hypogastric artery was the main outflow but at the primary bifurcation of the hypogastric artery, there was a highly calcific high-grade stenosis of greater than 95%.  The left external iliac artery was occluded as was the left common femoral artery.              Left Lower Extremity: This demonstrated occlusion of the left common femoral artery and the origin of the profunda femoris artery.  The SFA was occluded throughout its course with reconstitution of a diseased popliteal artery above the knee that then reoccluded down to the origins of the anterior tibial arteries and tibioperoneal trunk with the peroneal artery and anterior tibial artery being runoff distally after the proximal occlusions.   Disposition: Patient was taken to the recovery room in stable condition having tolerated the procedure well.  Complications: None  [name] 01/06/2022 1:14 PM   This note was created with Dragon Medical transcription system. Any errors in dictation are purely unintentional.

## 2022-01-06 NOTE — Progress Notes (Signed)
Dr. Lucky Cowboy at bedside and spoke with pt. Re: results of angio. MD also spoke with pt. Wife Ivin Booty on phone per pt. Request. Both verbalized understanding of conversation.

## 2022-01-14 ENCOUNTER — Telehealth: Payer: Self-pay | Admitting: Family Medicine

## 2022-01-14 NOTE — Telephone Encounter (Signed)
Mr. Eisenhour called in wanted to know about getting his kidney lab results sent to him by email or pick up. If poosible to email his email is [email or web address]

## 2022-01-14 NOTE — Telephone Encounter (Signed)
LMTCB

## 2022-01-23 ENCOUNTER — Other Ambulatory Visit: Payer: Self-pay | Admitting: Family Medicine

## 2022-01-25 ENCOUNTER — Ambulatory Visit (INDEPENDENT_AMBULATORY_CARE_PROVIDER_SITE_OTHER): Payer: 59 | Admitting: Vascular Surgery

## 2022-01-25 DIAGNOSIS — I739 Peripheral vascular disease, unspecified: Secondary | ICD-10-CM | POA: Diagnosis not present

## 2022-01-25 NOTE — Telephone Encounter (Signed)
Tried to call number that the patient left; no answer and no way to leave a VM. Called and left message on patients other number to call back. I can not email him his results but we can print them for him and he pick them up or we can mail them to him. I need to know what the patient would like me to do when he calls back?

## 2022-01-25 NOTE — Telephone Encounter (Signed)
Return phone call - call phone  [phone]

## 2022-01-25 NOTE — Telephone Encounter (Signed)
LMTCB

## 2022-01-26 ENCOUNTER — Other Ambulatory Visit: Payer: Self-pay | Admitting: Family Medicine

## 2022-01-27 ENCOUNTER — Telehealth: Payer: Self-pay | Admitting: Family Medicine

## 2022-01-27 NOTE — Telephone Encounter (Signed)
Pt called stating that he will pick up his results up tomorrow 01/28/22.

## 2022-01-27 NOTE — Telephone Encounter (Signed)
°  Encourage patient to contact the pharmacy for refills or they can request refills through [facility]  LAST APPOINTMENT DATE:  Please schedule appointment if longer than 1 year  NEXT APPOINTMENT DATE:  MEDICATION:gabapentin (NEURONTIN) 400 MG capsule [Pharmacy Med Name: GABAPENTIN 400 MG CAPSULE] [[id number]   Is the patient out of medication?   PHARMACY:[id number] - [place], [place] - [address]  Let patient know to contact pharmacy at the end of the day to make sure medication is ready.  Please notify patient to allow 48-72 hours to process  CLINICAL FILLS OUT ALL BELOW:   LAST REFILL:  QTY:  REFILL DATE:    OTHER COMMENTS:    Okay for refill?  Please advise

## 2022-01-27 NOTE — Telephone Encounter (Signed)
LOV - 08/27/21 NOV - Not scheduled RF - 04/10/20 #270/1

## 2022-01-27 NOTE — Telephone Encounter (Signed)
Labs have been printed and placed up front for pick up

## 2022-01-28 MED ORDER — GABAPENTIN 400 MG PO CAPS: 400.0000 mg | ORAL_CAPSULE | Freq: Three times a day (TID) | ORAL | 1 refills | Status: DC | PRN

## 2022-01-28 NOTE — Telephone Encounter (Signed)
Sent. Thanks.   

## 2022-01-28 NOTE — Addendum Note (Signed)
Addended by: Joaquim Nam on: 01/28/2022 06:26 AM   Modules accepted: Orders

## 2022-01-31 DIAGNOSIS — I6521 Occlusion and stenosis of right carotid artery: Secondary | ICD-10-CM | POA: Diagnosis not present

## 2022-02-10 DIAGNOSIS — G459 Transient cerebral ischemic attack, unspecified: Secondary | ICD-10-CM | POA: Diagnosis not present

## 2022-02-10 DIAGNOSIS — I10 Essential (primary) hypertension: Secondary | ICD-10-CM | POA: Diagnosis not present

## 2022-02-10 DIAGNOSIS — I6521 Occlusion and stenosis of right carotid artery: Secondary | ICD-10-CM | POA: Diagnosis not present

## 2022-02-22 DIAGNOSIS — I739 Peripheral vascular disease, unspecified: Secondary | ICD-10-CM | POA: Diagnosis not present

## 2022-02-23 DIAGNOSIS — I6521 Occlusion and stenosis of right carotid artery: Secondary | ICD-10-CM | POA: Diagnosis not present

## 2022-03-07 ENCOUNTER — Other Ambulatory Visit
Admission: RE | Admit: 2022-03-07 | Discharge: 2022-03-07 | Disposition: A | Discharge status: Home / Self Care | Payer: 59 | Attending: Internal Medicine | Admitting: Internal Medicine

## 2022-03-07 DIAGNOSIS — Z79899 Other long term (current) drug therapy: Secondary | ICD-10-CM | POA: Diagnosis not present

## 2022-03-07 DIAGNOSIS — Z01818 Encounter for other preprocedural examination: Secondary | ICD-10-CM | POA: Diagnosis not present

## 2022-03-07 DIAGNOSIS — Z01812 Encounter for preprocedural laboratory examination: Secondary | ICD-10-CM | POA: Insufficient documentation

## 2022-03-07 LAB — CBC WITH DIFFERENTIAL/PLATELET
Abs Immature Granulocytes: 0.01 10*3/uL (ref 0.00–0.07)
Basophils Absolute: 0 10*3/uL (ref 0.0–0.1)
Basophils Relative: 1 %
Eosinophils Absolute: 0.1 10*3/uL (ref 0.0–0.5)
Eosinophils Relative: 2 %
HCT: 42.5 % (ref 39.0–52.0)
Hemoglobin: 14 g/dL (ref 13.0–17.0)
Immature Granulocytes: 0 %
Lymphocytes Relative: 38 %
Lymphs Abs: 1.6 10*3/uL (ref 0.7–4.0)
MCH: 28.9 pg (ref 26.0–34.0)
MCHC: 32.9 g/dL (ref 30.0–36.0)
MCV: 87.6 fL (ref 80.0–100.0)
Monocytes Absolute: 0.6 10*3/uL (ref 0.1–1.0)
Monocytes Relative: 14 %
Neutro Abs: 1.9 10*3/uL (ref 1.7–7.7)
Neutrophils Relative %: 45 %
Platelets: 245 10*3/uL (ref 150–400)
RBC: 4.85 MIL/uL (ref 4.22–5.81)
RDW: 13.9 % (ref 11.5–15.5)
WBC: 4.2 10*3/uL (ref 4.0–10.5)
nRBC: 0 % (ref 0.0–0.2)

## 2022-03-07 LAB — COMPREHENSIVE METABOLIC PANEL
ALT: 21 U/L (ref 0–44)
AST: 28 U/L (ref 15–41)
Albumin: 4.6 g/dL (ref 3.5–5.0)
Alkaline Phosphatase: 61 U/L (ref 38–126)
Anion gap: 8 (ref 5–15)
BUN: 14 mg/dL (ref 6–20)
CO2: 27 mmol/L (ref 22–32)
Calcium: 9.1 mg/dL (ref 8.9–10.3)
Chloride: 103 mmol/L (ref 98–111)
Creatinine, Ser: 1.06 mg/dL (ref 0.61–1.24)
GFR, Estimated: >60 mL/min (ref >60)
Glucose, Bld: 98 mg/dL (ref 70–99)
Potassium: 3.3 mmol/L — ABNORMAL LOW (ref 3.5–5.1)
Sodium: 138 mmol/L (ref 135–145)
Total Bilirubin: 0.9 mg/dL (ref 0.3–1.2)
Total Protein: 7.7 g/dL (ref 6.5–8.1)

## 2022-03-07 LAB — BRAIN NATRIURETIC PEPTIDE: B Natriuretic Peptide: 24.4 pg/mL (ref 0.0–100.0)

## 2022-03-16 ENCOUNTER — Encounter
Admission: RE | Disposition: A | Discharge status: Home / Self Care | Payer: Self-pay | Source: Home / Self Care | Attending: Internal Medicine

## 2022-03-16 ENCOUNTER — Ambulatory Visit
Admission: RE | Admit: 2022-03-16 | Discharge: 2022-03-16 | Disposition: A | Discharge status: Home / Self Care | Payer: 59 | Attending: Internal Medicine | Admitting: Internal Medicine

## 2022-03-16 DIAGNOSIS — R9430 Abnormal result of cardiovascular function study, unspecified: Secondary | ICD-10-CM

## 2022-03-16 SURGERY — LEFT HEART CATH AND CORONARY ANGIOGRAPHY
Anesthesia: Moderate Sedation

## 2022-03-16 NOTE — Progress Notes (Signed)
Phone call received from scheduling earlier this am stating insurance approval was still pending for this patient's procedure and it will be rescheduled.  Patient and his wife arrived at 10:42 this am for procedure.  I called [facility] to find out if they had contacted the patient to notify of the above.  [facility] cardiology office staff stated they called patient x 2 this am and left voicemails.  I spoke with the patient and his wife and notified of the above information and was told by the patient that a doctor had offered to pay for his procedure out of pocket since insurance was denied at this time.  A man who identified himself as Dr. 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  I explained that the cath lab's schedule was adjusted and the patient was no longer on our schedule and I would have to contact Dr. Juliann Pares regarding proceeding with procedure as well as having the patient  sign a financial responsibility form since insurance was denied at this time.  Dr. Guinevere Scarlet told me "its only a procedure" "I was on staff here for 12 years and I am a friend of this patient's employer Mr. Caesar Chestnut" .  I told him that we have processes to follow and that those would need to be folllowed regardless of his willingness to pay for the patient's procedure.  Dr. Juliann Pares was contacted by myself and he agreed he could do the procedure but did not want the patient to be responsible for such a large bill.  Registration was contacted and a self pay bill was provided to Dr. Guinevere Scarlet who declined to pay this amount.  I notified Dr. Juliann Pares of the the same and he stated he was working with insurance to get his procedure approved and the office would be in touch with him to reschedule.  Patient denied chest pain, sob, or any other symptoms to myself.  The patient and wife were given this information and will await insurance approval.  ?

## 2022-03-18 ENCOUNTER — Other Ambulatory Visit: Payer: Self-pay | Admitting: Family Medicine

## 2022-03-29 DIAGNOSIS — R9430 Abnormal result of cardiovascular function study, unspecified: Secondary | ICD-10-CM

## 2022-03-30 ENCOUNTER — Other Ambulatory Visit: Payer: Self-pay

## 2022-03-30 ENCOUNTER — Encounter: Payer: Self-pay | Admitting: Internal Medicine

## 2022-03-30 ENCOUNTER — Ambulatory Visit
Admission: RE | Admit: 2022-03-30 | Discharge: 2022-03-30 | Disposition: A | Discharge status: Home / Self Care | Payer: 59 | Attending: Internal Medicine | Admitting: Internal Medicine

## 2022-03-30 ENCOUNTER — Encounter
Admission: RE | Disposition: A | Discharge status: Home / Self Care | Payer: Self-pay | Source: Home / Self Care | Attending: Internal Medicine

## 2022-03-30 DIAGNOSIS — R9430 Abnormal result of cardiovascular function study, unspecified: Secondary | ICD-10-CM

## 2022-03-30 DIAGNOSIS — I2510 Atherosclerotic heart disease of native coronary artery without angina pectoris: Secondary | ICD-10-CM | POA: Insufficient documentation

## 2022-03-30 HISTORY — PX: LEFT HEART CATH AND CORONARY ANGIOGRAPHY: CATH118249

## 2022-03-30 SURGERY — LEFT HEART CATH AND CORONARY ANGIOGRAPHY
Anesthesia: Moderate Sedation

## 2022-03-30 MED ORDER — SODIUM CHLORIDE 0.9% FLUSH: 3.0000 mL | Freq: Two times a day (BID) | INTRAVENOUS | Status: DC

## 2022-03-30 MED ORDER — HEPARIN SODIUM (PORCINE) 1000 UNIT/ML IJ SOLN
INTRAMUSCULAR | Status: DC | PRN
  Administered 2022-03-30: 5000 [IU] via INTRAVENOUS

## 2022-03-30 MED ORDER — SODIUM CHLORIDE 0.9 % WEIGHT BASED INFUSION
1.0000 mL/kg/h | INTRAVENOUS | Status: DC
  Administered 2022-03-30: 1 mL/kg/h via INTRAVENOUS

## 2022-03-30 MED ORDER — LIDOCAINE HCL (PF) 1 % IJ SOLN
INTRAMUSCULAR | Status: DC | PRN
  Administered 2022-03-30: 2 mL

## 2022-03-30 MED ORDER — IOHEXOL 300 MG/ML  SOLN
INTRAMUSCULAR | Status: DC | PRN
  Administered 2022-03-30: 75 mL

## 2022-03-30 MED ORDER — MIDAZOLAM HCL 2 MG/2ML IJ SOLN
INTRAMUSCULAR | Status: DC | PRN
  Administered 2022-03-30: 1 mg via INTRAVENOUS

## 2022-03-30 MED ORDER — SODIUM CHLORIDE 0.9 % WEIGHT BASED INFUSION
1.0000 mL/kg/h | INTRAVENOUS | Status: DC
Start: 2022-03-30 — End: 2022-03-30

## 2022-03-30 MED ORDER — MIDAZOLAM HCL 2 MG/2ML IJ SOLN
INTRAMUSCULAR | Status: AC
Start: 2022-03-30 — End: ?
  Filled 2022-03-30: qty 2

## 2022-03-30 MED ORDER — SODIUM CHLORIDE 0.9 % IV SOLN
250.0000 mL | INTRAVENOUS | Status: DC | PRN
Start: 2022-03-30 — End: 2022-03-30

## 2022-03-30 MED ORDER — SODIUM CHLORIDE 0.9 % WEIGHT BASED INFUSION
3.0000 mL/kg/h | INTRAVENOUS | Status: AC
  Administered 2022-03-30: 3 mL/kg/h via INTRAVENOUS

## 2022-03-30 MED ORDER — HEPARIN SODIUM (PORCINE) 1000 UNIT/ML IJ SOLN
INTRAMUSCULAR | Status: AC
  Filled 2022-03-30: qty 10

## 2022-03-30 MED ORDER — ASPIRIN 81 MG PO CHEW
81.0000 mg | CHEWABLE_TABLET | ORAL | Status: DC
Start: 2022-03-31 — End: 2022-03-30

## 2022-03-30 MED ORDER — LIDOCAINE HCL 1 % IJ SOLN
INTRAMUSCULAR | Status: AC
  Filled 2022-03-30: qty 20

## 2022-03-30 MED ORDER — SODIUM CHLORIDE 0.9 % WEIGHT BASED INFUSION: 3.0000 mL/kg/h | INTRAVENOUS | Status: AC

## 2022-03-30 MED ORDER — FENTANYL CITRATE (PF) 100 MCG/2ML IJ SOLN
INTRAMUSCULAR | Status: DC | PRN
  Administered 2022-03-30: 25 ug via INTRAVENOUS

## 2022-03-30 MED ORDER — SODIUM CHLORIDE 0.9% FLUSH: 3.0000 mL | INTRAVENOUS | Status: DC | PRN

## 2022-03-30 MED ORDER — HEPARIN (PORCINE) IN NACL 1000-0.9 UT/500ML-% IV SOLN
INTRAVENOUS | Status: AC
  Filled 2022-03-30: qty 1000

## 2022-03-30 MED ORDER — FENTANYL CITRATE (PF) 100 MCG/2ML IJ SOLN
INTRAMUSCULAR | Status: AC
Start: 2022-03-30 — End: ?
  Filled 2022-03-30: qty 2

## 2022-03-30 MED ORDER — SODIUM CHLORIDE 0.9 % IV SOLN: 250.0000 mL | INTRAVENOUS | Status: DC | PRN

## 2022-03-30 MED ORDER — VERAPAMIL HCL 2.5 MG/ML IV SOLN
INTRAVENOUS | Status: DC | PRN
  Administered 2022-03-30: 2.5 mg via INTRA_ARTERIAL

## 2022-03-30 MED ORDER — VERAPAMIL HCL 2.5 MG/ML IV SOLN
INTRAVENOUS | Status: AC
  Filled 2022-03-30: qty 2

## 2022-03-30 MED ORDER — HEPARIN (PORCINE) IN NACL 1000-0.9 UT/500ML-% IV SOLN
INTRAVENOUS | Status: DC | PRN
  Administered 2022-03-30 (×2): 500 mL

## 2022-03-30 SURGICAL SUPPLY — 11 items
CATH 5FR JL3.5 JR4 ANG PIG MP (CATHETERS) ×1 IMPLANT
DEVICE RAD TR BAND REGULAR (VASCULAR PRODUCTS) ×1 IMPLANT
DRAPE BRACHIAL (DRAPES) ×1 IMPLANT
GLIDESHEATH SLEND SS 6F .021 (SHEATH) ×1 IMPLANT
GUIDEWIRE INQWIRE 1.5J.035X260 (WIRE) IMPLANT
INQWIRE 1.5J .035X260CM (WIRE) ×2
PACK CARDIAC CATH (CUSTOM PROCEDURE TRAY) ×2 IMPLANT
PROTECTION STATION PRESSURIZED (MISCELLANEOUS) ×2
SET ATX SIMPLICITY (MISCELLANEOUS) ×1 IMPLANT
STATION PROTECTION PRESSURIZED (MISCELLANEOUS) IMPLANT
TUBING CIL FLEX 10 FLL-RA (TUBING) ×1 IMPLANT

## 2022-03-31 ENCOUNTER — Encounter: Payer: Self-pay | Admitting: Internal Medicine

## 2022-04-11 DIAGNOSIS — R0683 Snoring: Secondary | ICD-10-CM | POA: Diagnosis not present

## 2022-04-11 DIAGNOSIS — R69 Illness, unspecified: Secondary | ICD-10-CM | POA: Diagnosis not present

## 2022-04-11 DIAGNOSIS — N400 Enlarged prostate without lower urinary tract symptoms: Secondary | ICD-10-CM | POA: Diagnosis not present

## 2022-04-11 DIAGNOSIS — I10 Essential (primary) hypertension: Secondary | ICD-10-CM | POA: Diagnosis not present

## 2022-04-11 DIAGNOSIS — I739 Peripheral vascular disease, unspecified: Secondary | ICD-10-CM | POA: Diagnosis not present

## 2022-04-11 DIAGNOSIS — K219 Gastro-esophageal reflux disease without esophagitis: Secondary | ICD-10-CM | POA: Diagnosis not present

## 2022-04-11 DIAGNOSIS — I6521 Occlusion and stenosis of right carotid artery: Secondary | ICD-10-CM | POA: Diagnosis not present

## 2022-04-11 DIAGNOSIS — G459 Transient cerebral ischemic attack, unspecified: Secondary | ICD-10-CM | POA: Diagnosis not present

## 2022-04-22 ENCOUNTER — Encounter: Payer: Self-pay | Admitting: Nurse Practitioner

## 2022-04-22 ENCOUNTER — Ambulatory Visit (INDEPENDENT_AMBULATORY_CARE_PROVIDER_SITE_OTHER): Payer: 59 | Admitting: Nurse Practitioner

## 2022-04-22 ENCOUNTER — Telehealth: Payer: Self-pay

## 2022-04-22 DIAGNOSIS — J069 Acute upper respiratory infection, unspecified: Secondary | ICD-10-CM

## 2022-04-22 DIAGNOSIS — U071 COVID-19: Secondary | ICD-10-CM | POA: Insufficient documentation

## 2022-04-22 LAB — POC COVID19 BINAXNOW: SARS Coronavirus 2 Ag: POSITIVE — AB

## 2022-04-22 MED ORDER — NIRMATRELVIR/RITONAVIR (PAXLOVID)TABLET: 3.0000 | ORAL_TABLET | Freq: Two times a day (BID) | ORAL | 0 refills | Status: AC

## 2022-04-22 NOTE — Assessment & Plan Note (Signed)
Symptoms started 4 days ago, he is in treatment window with paxlovid. He would like to start the antiviral. Told him to stop his atorvastatin while taking the paxlovid. Reviewed home care instructions for COVID. Advised self-isolation at home for at least 5 days. After 5 days, if improved and fever resolved, can be in public, but should wear a mask around others for an additional 5 days. If symptoms, esp, dyspnea develops/worsens, recommend in-person evaluation at either an urgent care or the emergency room.

## 2022-04-22 NOTE — Telephone Encounter (Signed)
[facility] - Client TELEPHONE ADVICE RECORD Access[facility]urse Patient Name: Michael Chung Gender: Male DOB: 1961-12-04 Age: 61 Y 5 M 28 D Return Phone Number: [phone] (Primary) Address: City/ State/ Zip: [place]  [address] Client [name] - Client Client Site [facility] - Day Provider Raechel Ache - MD Contact Type Call Who Is Calling Patient / Member / Family / Caregiver Call Type Triage / Clinical Relationship To Patient Self Return Phone Number [phone] (Primary) Chief Complaint Headache Reason for Call Symptomatic / Request for Health Information Initial Comment Caller may has COVID, allergies, stuffy nose, cold, headache, his wife tested positive for COVID. Translation No Nurse Assessment Nurse: Izora Ribas, RN, Melanie Date/Time (Eastern Time): 04/22/2022 1:11:57 PM Confirm and document reason for call. If symptomatic, describe symptoms. ---Caller may has COVID. His wife tested positive for COVID this am. Allergies, stuffy nose, cold type s/s. Looking for testing. Does the patient have any new or worsening symptoms? ---Yes Will a triage be completed? ---Yes Related visit to physician within the last 2 weeks? ---No Does the PT have any chronic conditions? (i.e. diabetes, asthma, this includes High risk factors for pregnancy, etc.) ---Yes Is this a behavioral health or substance abuse call? ---No Guidelines Guideline Title Affirmed Question Affirmed Notes Nurse Date/Time (Eastern Time) COVID-19 - Diagnosed or Suspected [1] HIGH RISK for severe COVID complications (e.g., weak immune system, age > 64 years, obesity with BMI 30 or higher, pregnant, chronic lung disease or other chronic medical condition) AND [2] COVID symptoms Izora Ribas, RN, Shawna Orleans 04/22/2022 1:13:55 PM PLEASE NOTE: All timestamps contained within this report are represented as [place] Standard  Time. CONFIDENTIALTY NOTICE: This fax transmission is intended only for the addressee. It contains information that is legally privileged, confidential or otherwise protected from use or disclosure. If you are not the intended recipient, you are strictly prohibited from reviewing, disclosing, copying using or disseminating any of this information or taking any action in reliance on or regarding this information. If you have received this fax in error, please notify us immediately by telephone so that we can arrange for its return to [place]. Phone: [phone], Toll-Free: [phone], Fax: [phone] Page: 2 of 2 Call Id: [id number] Guidelines Guideline Title Affirmed Question Affirmed Notes Nurse Date/Time [name] Time) (e.g., cough, fever) (Exceptions: Already seen by PCP and no new or worsening symptoms.) Disp. Time [name] Time) Disposition Final User 04/22/2022 1:18:02 PM Call PCP within 24 Hours Yes Izora Ribas, RN, Shawna Orleans Caller Disagree/Comply Comply Caller Understands Yes PreDisposition Call Doctor Care Advice Given Per Guideline CALL PCP WITHIN 24 HOURS: * You need to discuss this with your doctor (or NP/PA) within the next 24 hours. * IF OFFICE WILL BE CLOSED: I'll page the on-call provider now. EXCEPTION: from 9 pm to 9 am. Since this isn't urgent, we'll hold the page until morning. ALTERNATE DISPOSITION - TELEMEDICINE WITHIN 24 HOURS: CARE ADVICE given per COVID-19 - DIAGNOSED OR SUSPECTED (Adult) guideline. * You become worse CALL BACK IF: COVID-19 - HOW TO PROTECT OTHERS - WHEN YOU ARE SICK WITH COVID-19: Comments User: Patria Mane, RN Date/Time (Eastern Time): 04/22/2022 1:19:44 PM Called backline to see if do COVID testing, connected pt to staff (will try to work him in

## 2022-04-22 NOTE — Patient Instructions (Signed)
It was great to see you!  You did test positive for covid-19. Start paxlovid twice a day with food. Stop your cholesterol medication, the atorvastatin, while taking paxlovid.   Call or seek in person care if you develop shortness of breath or worsening symptoms.   Try to avoid going out in public until Sunday, and then wear a mask for 5 days if out in public.   Let's follow-up if your symptoms worsen or don't improve.   Take care,  Rodman Pickle, NP

## 2022-04-22 NOTE — Telephone Encounter (Signed)
Noted. Thanks.

## 2022-04-22 NOTE — Telephone Encounter (Signed)
noted 

## 2022-04-22 NOTE — Telephone Encounter (Signed)
Per appt notes pt has telephone visit with Rodman Pickle NP at [facility] 04/22/22 at 2:20. Sending note to Junius Roads NP and FYI to Dr Para March as PCP.

## 2022-04-22 NOTE — Progress Notes (Signed)
[facility] [address] [place] [address] Dept: [phone] Dept Fax: [phone]  Telephone Visit  I connected with Michael Chung on 04/22/22 at  2:20 PM EDT by telephone and verified that I am speaking with the correct person using two identifiers.  Location patient: Home Location provider: Clinic Persons participating in the virtual visit: Patient; Michael Pickle, NP; Willaim Bane, CMA  I discussed the limitations of evaluation and management by telemedicine and the availability of in person appointments. The patient expressed understanding and agreed to proceed.  Chief Complaint  Patient presents with   URI    Pt c/o nasal congestion w/ mild cough x4 days. Wife tested positive for Covid 04/22/22     SUBJECTIVE:  HPI: Michael Chung is a 61 y.o. male who presents with scratchy throat with nasal congestion and cough for 4 days.   UPPER RESPIRATORY TRACT INFECTION  Fever: no Cough:  slight Shortness of breath: no Wheezing: no Chest pain: no Chest tightness: no Chest congestion: yes Nasal congestion: yes Runny nose: no Post nasal drip: no Sneezing: yes Sore throat:  scratchy Swollen glands: no Sinus pressure: no Headache: no Face pain: no Toothache: no Ear pain: no bilateral Ear pressure: no bilateral Eyes red/itching:no Eye drainage/crusting: no  Vomiting: no Rash: no Fatigue: yes Sick contacts: yes - wife Strep contacts: no  Context: better Recurrent sinusitis: no Relief with OTC cold/cough medications: yes  Treatments attempted: aspirin that he takes daily    Past Medical History:  Diagnosis Date   Arthritis    Coffee ground emesis    2016 after nsaid use   Dupuytren contracture 06/2013   right long, ring, small fingers   GERD (gastroesophageal reflux disease)    uses OTC as needed    Past Surgical History:  Procedure Laterality Date   CAROTID PTA/STENT INTERVENTION Right  08/16/2021   Procedure: CAROTID PTA/STENT INTERVENTION;  Surgeon: Annice Needy, MD;  Location: [facility];  Service: Cardiovascular;  Laterality: Right;   DUPUYTREN CONTRACTURE RELEASE Right 06/27/2013   Procedure: EXCISION DUPUYTRENS RIGHT LONG AND RING  FINGERS;  Surgeon: Wyn Forster., MD;  Location: [facility];  Service: Orthopedics;  Laterality: Right;   LEFT HEART CATH AND CORONARY ANGIOGRAPHY N/A 03/30/2022   Procedure: LEFT HEART CATH AND CORONARY ANGIOGRAPHY;  Surgeon: Alwyn Pea, MD;  Location: [facility];  Service: Cardiovascular;  Laterality: N/A;   LOWER EXTREMITY ANGIOGRAPHY Left 01/06/2022   Procedure: LOWER EXTREMITY ANGIOGRAPHY;  Surgeon: Annice Needy, MD;  Location: [facility];  Service: Cardiovascular;  Laterality: Left;   TOTAL HIP ARTHROPLASTY Left 11/25/2015   Procedure: TOTAL HIP ARTHROPLASTY;  Surgeon: Deeann Saint, MD;  Location: [facility];  Service: Orthopedics;  Laterality: Left;   TOTAL HIP ARTHROPLASTY Right    2020    Family History  Problem Relation Age of Onset   Hypertension Father    Diabetes Father    Heart Problems Father    Diabetes Mother    Hypertension Mother    Dementia Mother    Colon cancer Brother    Colon cancer Brother    Prostate cancer Neg Hx     Social History   Tobacco Use   Smoking status: Former    Packs/day: 1.00    Years: 20.00    Pack years: 20.00    Types: Cigarettes    Quit date: 06/2021    Years since quitting:  0.8   Smokeless tobacco: Never  Vaping Use   Vaping Use: Never used  Substance Use Topics   Alcohol use: No    Alcohol/week: 0.0 standard drinks   Drug use: No     Current Outpatient Medications:    nirmatrelvir/ritonavir EUA (PAXLOVID) 20 x 150 MG & 10 x  TABS, Take 3 tablets by mouth 2 (two) times daily for 5 days. (Take nirmatrelvir 150 mg two tablets twice daily for 5 days and ritonavir 100 mg one tablet twice daily for 5 days) Patient GFR is  >60, Disp: 30 tablet, Rfl: 0   amLODipine (NORVASC) 10 MG tablet, Take 1 tablet (10 mg total) by mouth daily., Disp: 90 tablet, Rfl: 3   aspirin EC 81 MG EC tablet, Take 1 tablet (81 mg total) by mouth daily at 6 (six) AM. Swallow whole., Disp: 30 tablet, Rfl: 0   atorvastatin (LIPITOR) 80 MG tablet, Take 1 tablet (80 mg total) by mouth at bedtime., Disp: 90 tablet, Rfl: 3   clopidogrel (PLAVIX) 75 MG tablet, Take 1 tablet (75 mg total) by mouth daily., Disp: 90 tablet, Rfl: 1   cyclobenzaprine (FLEXERIL) 10 MG tablet, TAKE 1 TABLET (10 MG TOTAL) BY MOUTH DAILY AS NEEDED FOR MUSCLE SPASMS (SEDATION CAUTION.)., Disp: 30 tablet, Rfl: 1   gabapentin (NEURONTIN) 400 MG capsule, Take 1 capsule (400 mg total) by mouth 3 (three) times daily as needed. (Patient taking differently: Take 400 mg by mouth daily as needed (nerve pain).), Disp: 270 capsule, Rfl: 1   hydrochlorothiazide (HYDRODIURIL) 12.5 MG tablet, Take 1 tablet (12.5 mg total) by mouth daily., Disp: 90 tablet, Rfl: 3   pantoprazole (PROTONIX) 20 MG tablet, TAKE 1 TABLET BY MOUTH EVERY DAY (Patient taking differently: Take 20 mg by mouth daily.), Disp: 90 tablet, Rfl: 3  Allergies  Allergen Reactions   Citrus Anaphylaxis    Tongue swelling with eating oranges   Nsaids Other (See Comments)    H/o coffee ground emesis    ROS: See pertinent positives and negatives per HPI.  OBSERVATIONS/OBJECTIVE:  VITALS per patient if applicable:  General: Alert and oriented x4, able to talk in complete sentences  There were no vitals filed for this visit.   ASSESSMENT AND PLAN:  Problem List Items Addressed This Visit       Other   COVID-19 - Primary    Symptoms started 4 days ago, he is in treatment window with paxlovid. He would like to start the antiviral. Told him to stop his atorvastatin while taking the paxlovid. Reviewed home care instructions for COVID. Advised self-isolation at home for at least 5 days. After 5 days, if improved and  fever resolved, can be in public, but should wear a mask around others for an additional 5 days. If symptoms, esp, dyspnea develops/worsens, recommend in-person evaluation at either an urgent care or the emergency room.        Relevant Medications   nirmatrelvir/ritonavir EUA (PAXLOVID) 20 x 150 MG & 10 x  TABS   Other Visit Diagnoses     Upper respiratory tract infection, unspecified type       With wife testing positive for covid-19, will test him today. Drink plenty of fluids, can continue aspirin daily. Will call with the results.    Relevant Medications   nirmatrelvir/ritonavir EUA (PAXLOVID) 20 x 150 MG & 10 x  TABS   Other Relevant Orders   POC COVID-19 BinaxNow (Completed)       I discussed the assessment  and treatment plan with the patient. The patient was provided an opportunity to ask questions and all were answered. The patient agreed with the plan and demonstrated an understanding of the instructions.   The patient was advised to call back or seek an in-person evaluation if the symptoms worsen or if the condition fails to improve as anticipated.  I spent 20 minutes on this telephone encounter.  Gerre Scull, NP

## 2022-05-18 ENCOUNTER — Other Ambulatory Visit: Payer: Self-pay | Admitting: Family Medicine

## 2022-05-25 MED ORDER — IBUPROFEN 800 MG PO TABS
800.0000 mg | ORAL_TABLET | Freq: Every day | ORAL | 1 refills | Status: DC | PRN
Start: 2022-05-25 — End: 2022-08-16

## 2022-05-25 NOTE — Addendum Note (Signed)
Addended by: Joaquim Nam on: 05/25/2022 12:57 PM   Modules accepted: Orders

## 2022-05-25 NOTE — Telephone Encounter (Addendum)
Sent but make sure patient is taking med with food, taking pantoprazole and not having abdominal pain.  Please set yearly visit for this fall.  Thanks.

## 2022-05-25 NOTE — Telephone Encounter (Signed)
Patient called office states that he did not refill on Ibuprofen last time because he had several refills due to taking as needed. He is now out and would like refill on that as well. He takes 800mg  daily as needed for hip pain. Is no longer on his list ok to refill as well?

## 2022-06-16 ENCOUNTER — Ambulatory Visit (INDEPENDENT_AMBULATORY_CARE_PROVIDER_SITE_OTHER): Payer: 59 | Admitting: Nurse Practitioner

## 2022-06-16 ENCOUNTER — Encounter (INDEPENDENT_AMBULATORY_CARE_PROVIDER_SITE_OTHER): Payer: 59

## 2022-07-09 ENCOUNTER — Other Ambulatory Visit: Payer: Self-pay | Admitting: Family Medicine

## 2022-07-11 NOTE — Telephone Encounter (Signed)
Patient has been scheduled

## 2022-07-11 NOTE — Telephone Encounter (Signed)
Patient is due for CPE in sept. Please call to schedule appt

## 2022-07-11 NOTE — Telephone Encounter (Signed)
Noted  

## 2022-07-24 ENCOUNTER — Other Ambulatory Visit: Payer: Self-pay | Admitting: Family Medicine

## 2022-07-27 ENCOUNTER — Other Ambulatory Visit: Payer: Self-pay | Admitting: Family Medicine

## 2022-07-27 DIAGNOSIS — Z125 Encounter for screening for malignant neoplasm of prostate: Secondary | ICD-10-CM

## 2022-07-27 DIAGNOSIS — I10 Essential (primary) hypertension: Secondary | ICD-10-CM

## 2022-08-10 ENCOUNTER — Other Ambulatory Visit (INDEPENDENT_AMBULATORY_CARE_PROVIDER_SITE_OTHER): Payer: 59

## 2022-08-10 DIAGNOSIS — Z125 Encounter for screening for malignant neoplasm of prostate: Secondary | ICD-10-CM

## 2022-08-10 DIAGNOSIS — I10 Essential (primary) hypertension: Secondary | ICD-10-CM

## 2022-08-10 LAB — COMPREHENSIVE METABOLIC PANEL
ALT: 12 U/L (ref 0–53)
AST: 16 U/L (ref 0–37)
Albumin: 3.9 g/dL (ref 3.5–5.2)
Alkaline Phosphatase: 53 U/L (ref 39–117)
BUN: 16 mg/dL (ref 6–23)
CO2: 30 mEq/L (ref 19–32)
Calcium: 9.3 mg/dL (ref 8.4–10.5)
Chloride: 100 mEq/L (ref 96–112)
Creatinine, Ser: 1.18 mg/dL (ref 0.40–1.50)
GFR: 66.94 mL/min (ref >60.00)
Glucose, Bld: 100 mg/dL — ABNORMAL HIGH (ref 70–99)
Potassium: 3.4 mEq/L — ABNORMAL LOW (ref 3.5–5.1)
Sodium: 137 mEq/L (ref 135–145)
Total Bilirubin: 0.4 mg/dL (ref 0.2–1.2)
Total Protein: 6.7 g/dL (ref 6.0–8.3)

## 2022-08-10 LAB — PSA: PSA: 0.5 ng/mL (ref 0.10–4.00)

## 2022-08-10 LAB — CBC WITH DIFFERENTIAL/PLATELET
Basophils Absolute: 0 10*3/uL (ref 0.0–0.1)
Basophils Relative: 0.5 % (ref 0.0–3.0)
Eosinophils Absolute: 0.1 10*3/uL (ref 0.0–0.7)
Eosinophils Relative: 3.4 % (ref 0.0–5.0)
HCT: 39.7 % (ref 39.0–52.0)
Hemoglobin: 13.2 g/dL (ref 13.0–17.0)
Lymphocytes Relative: 46 % (ref 12.0–46.0)
Lymphs Abs: 1.7 10*3/uL (ref 0.7–4.0)
MCHC: 33.3 g/dL (ref 30.0–36.0)
MCV: 87.6 fl (ref 78.0–100.0)
Monocytes Absolute: 0.6 10*3/uL (ref 0.1–1.0)
Monocytes Relative: 17.4 % — ABNORMAL HIGH (ref 3.0–12.0)
Neutro Abs: 1.2 10*3/uL — ABNORMAL LOW (ref 1.4–7.7)
Neutrophils Relative %: 32.7 % — ABNORMAL LOW (ref 43.0–77.0)
Platelets: 199 10*3/uL (ref 150.0–400.0)
RBC: 4.53 Mil/uL (ref 4.22–5.81)
RDW: 14 % (ref 11.5–15.5)
WBC: 3.7 10*3/uL — ABNORMAL LOW (ref 4.0–10.5)

## 2022-08-10 LAB — LIPID PANEL
Cholesterol: 157 mg/dL (ref 0–200)
HDL: 29.1 mg/dL — ABNORMAL LOW (ref >39.00)
LDL Cholesterol: 96 mg/dL (ref 0–99)
NonHDL: 127.45
Total CHOL/HDL Ratio: 5
Triglycerides: 159 mg/dL — ABNORMAL HIGH (ref 0.0–149.0)
VLDL: 31.8 mg/dL (ref 0.0–40.0)

## 2022-08-16 ENCOUNTER — Ambulatory Visit (INDEPENDENT_AMBULATORY_CARE_PROVIDER_SITE_OTHER): Payer: 59 | Admitting: Family Medicine

## 2022-08-16 ENCOUNTER — Encounter: Payer: Self-pay | Admitting: Family Medicine

## 2022-08-16 VITALS — BP 134/80 | HR 81 | Temp 97.2°F | Ht 73.0 in | Wt 234.0 lb

## 2022-08-16 DIAGNOSIS — Z0000 Encounter for general adult medical examination without abnormal findings: Secondary | ICD-10-CM | POA: Diagnosis not present

## 2022-08-16 DIAGNOSIS — M25559 Pain in unspecified hip: Secondary | ICD-10-CM

## 2022-08-16 DIAGNOSIS — Z23 Encounter for immunization: Secondary | ICD-10-CM | POA: Diagnosis not present

## 2022-08-16 DIAGNOSIS — I10 Essential (primary) hypertension: Secondary | ICD-10-CM

## 2022-08-16 DIAGNOSIS — Z1211 Encounter for screening for malignant neoplasm of colon: Secondary | ICD-10-CM

## 2022-08-16 DIAGNOSIS — Z7189 Other specified counseling: Secondary | ICD-10-CM

## 2022-08-16 DIAGNOSIS — Z8673 Personal history of transient ischemic attack (TIA), and cerebral infarction without residual deficits: Secondary | ICD-10-CM

## 2022-08-16 MED ORDER — HYDROCHLOROTHIAZIDE 12.5 MG PO TABS: 12.5000 mg | ORAL_TABLET | Freq: Every day | ORAL | 3 refills | Status: DC

## 2022-08-16 MED ORDER — ATORVASTATIN CALCIUM 80 MG PO TABS
ORAL_TABLET | ORAL | 3 refills | Status: DC
Start: 2022-08-16 — End: 2023-02-21

## 2022-08-16 MED ORDER — AMLODIPINE BESYLATE 10 MG PO TABS
10.0000 mg | ORAL_TABLET | Freq: Every day | ORAL | 3 refills | Status: DC
Start: 2022-08-16 — End: 2023-08-24

## 2022-08-16 MED ORDER — TADALAFIL 5 MG PO TABS: 5.0000 mg | ORAL_TABLET | Freq: Every day | ORAL | Status: DC

## 2022-08-16 MED ORDER — IBUPROFEN 800 MG PO TABS: 800.0000 mg | ORAL_TABLET | Freq: Every day | ORAL | 1 refills | Status: DC | PRN

## 2022-08-16 MED ORDER — CLOPIDOGREL BISULFATE 75 MG PO TABS
75.0000 mg | ORAL_TABLET | Freq: Every day | ORAL | 3 refills | Status: DC
Start: 2022-08-16 — End: 2023-08-24

## 2022-08-16 MED ORDER — PANTOPRAZOLE SODIUM 20 MG PO TBEC: 20.0000 mg | DELAYED_RELEASE_TABLET | Freq: Every day | ORAL | 3 refills | Status: DC

## 2022-08-16 NOTE — Progress Notes (Unsigned)
CPE- See plan.  Routine anticipatory guidance given to patient.  See health maintenance.  The possibility exists that previously documented standard health maintenance information may have been brought forward from a previous encounter into this note.  If needed, that same information has been updated to reflect the current situation based on today's encounter.    Tetanus 2016 Flu done 2023 PNA d/w pt. Shingles done prev covid vaccine prev done Colonoscopy ordered 2023.  Prostate cancer screening wnl 2023 Living will d/w pt.  Wife designated if patient were incapacitated.   Diet and exercise d/w pt. Not due for AAA screening.    He tolerated nsaids with PPI use.  No ADE on meds.  Nsaid cautions d/w pt. used for hip pain with relief.  Elevated Cholesterol: Using medications without problems:yes Muscle aches: no Diet compliance:yes Exercise: yes, talking for exercise, about 1/2 mile at a time then restart, due to hip pain.    Hypertension:    Using medication without problems or lightheadedness: yes Chest pain with exertion: no Edema:no Short of breath:no Labs d/w pt.   On cialis, no NTG use, d/w pt about routine cautions.    History of CVA.  Still on aspirin and plavix.  He had cards f/u pending.  Labs discussed with patient.  Compliant with atorvastatin.  PMH and SH reviewed  Meds, vitals, and allergies reviewed.   ROS: Per HPI.  Unless specifically indicated otherwise in HPI, the patient denies:  General: fever. Eyes: acute vision changes ENT: sore throat Cardiovascular: chest pain Respiratory: SOB GI: vomiting GU: dysuria Musculoskeletal: acute back pain Derm: acute rash Neuro: acute motor dysfunction Psych: worsening mood Endocrine: polydipsia Heme: bleeding Allergy: hayfever  GEN: nad, alert and oriented HEENT: ncat NECK: supple w/o LA CV: rrr. PULM: ctab, no inc wob ABD: soft, +bs EXT: no edema SKIN: no acute rash  Contracture L hand at baseline.

## 2022-08-16 NOTE — Patient Instructions (Signed)
Take care.  Glad to see you. Update me as needed.  Thanks for your effort.  

## 2022-08-17 NOTE — Assessment & Plan Note (Signed)
Continue as needed NSAID use with routine cautions.  Continue PPI in the meantime, given NSAID use.

## 2022-08-17 NOTE — Assessment & Plan Note (Signed)
Continue amlodipine and hydrochlorothiazide 

## 2022-08-17 NOTE — Assessment & Plan Note (Signed)
No new symptoms.  Still on aspirin and plavix.  He had cards f/u pending.  Labs discussed with patient.  Compliant with atorvastatin.  Continue as is.

## 2022-08-17 NOTE — Assessment & Plan Note (Signed)
Tetanus 2016 Flu done 2023 PNA d/w pt. Shingles done prev covid vaccine prev done Colonoscopy ordered 2023.  Prostate cancer screening wnl 2023 Living will d/w pt.  Wife designated if patient were incapacitated.   Diet and exercise d/w pt. Not due for AAA screening.

## 2022-08-17 NOTE — Assessment & Plan Note (Signed)
Living will d/w pt.  Wife designated if patient were incapacitated.   ?

## 2022-09-06 ENCOUNTER — Telehealth: Payer: Self-pay | Admitting: Family Medicine

## 2022-09-06 NOTE — Telephone Encounter (Signed)
Spoke with patients wife; okay per DPR. Advised that the GI appt that he has is for the colonoscopy to get set up. Advised that patient had PSA done last month when he was here and Dr. Damita Dunnings went over those results with the patient. Michael Chung verbalized understanding and nothing further is needed.

## 2022-09-06 NOTE — Telephone Encounter (Signed)
Pt's wife, Ivin Booty, called asking for an update on the referral for the coloscopy, told the pt the office info to give them a call. Pt's wife asked about the referral for his prostate screening? She stated as an appt in Jan 2024 to see a gastro dr, they wanted to know was that for his prostate screening? If not, they're not sure what that appt is for. Call back # [phone]

## 2022-09-17 ENCOUNTER — Other Ambulatory Visit: Payer: Self-pay | Admitting: Family Medicine

## 2022-10-03 ENCOUNTER — Encounter (INDEPENDENT_AMBULATORY_CARE_PROVIDER_SITE_OTHER): Payer: Self-pay

## 2022-10-20 ENCOUNTER — Telehealth: Payer: Self-pay

## 2022-10-20 ENCOUNTER — Other Ambulatory Visit (HOSPITAL_COMMUNITY): Payer: Self-pay

## 2022-10-20 NOTE — Telephone Encounter (Signed)
Patient Advocate Encounter  Received notification from [employer] that prior authorization is required for Pantoprazole Sodium 20MG  dr tablets. PA submitted and APPROVED on 10/20/2022.  Key 10/22/2022  Additional information will be provided in the approval communication

## 2022-11-14 ENCOUNTER — Other Ambulatory Visit: Payer: Self-pay | Admitting: Family Medicine

## 2022-11-14 NOTE — Telephone Encounter (Signed)
Refill request for GABAPENTIN 400 MG CAPSULE   LOV - 08/16/22 Next OV - not scheduled Last refill - 01/28/22 #[phone]/[id number]

## 2022-12-06 ENCOUNTER — Other Ambulatory Visit: Payer: Self-pay | Admitting: Family Medicine

## 2022-12-08 DIAGNOSIS — Z800 Family history of malignant neoplasm of digestive organs: Secondary | ICD-10-CM | POA: Diagnosis not present

## 2022-12-08 DIAGNOSIS — Z01818 Encounter for other preprocedural examination: Secondary | ICD-10-CM | POA: Diagnosis not present

## 2023-02-10 DIAGNOSIS — Z01818 Encounter for other preprocedural examination: Secondary | ICD-10-CM | POA: Diagnosis not present

## 2023-02-10 DIAGNOSIS — K219 Gastro-esophageal reflux disease without esophagitis: Secondary | ICD-10-CM | POA: Diagnosis not present

## 2023-02-10 DIAGNOSIS — I10 Essential (primary) hypertension: Secondary | ICD-10-CM | POA: Diagnosis not present

## 2023-02-10 DIAGNOSIS — F17200 Nicotine dependence, unspecified, uncomplicated: Secondary | ICD-10-CM | POA: Diagnosis not present

## 2023-02-10 DIAGNOSIS — R0683 Snoring: Secondary | ICD-10-CM | POA: Diagnosis not present

## 2023-02-10 DIAGNOSIS — I739 Peripheral vascular disease, unspecified: Secondary | ICD-10-CM | POA: Diagnosis not present

## 2023-02-10 DIAGNOSIS — G459 Transient cerebral ischemic attack, unspecified: Secondary | ICD-10-CM | POA: Diagnosis not present

## 2023-02-14 DIAGNOSIS — Z8673 Personal history of transient ischemic attack (TIA), and cerebral infarction without residual deficits: Secondary | ICD-10-CM | POA: Diagnosis not present

## 2023-02-14 DIAGNOSIS — I6521 Occlusion and stenosis of right carotid artery: Secondary | ICD-10-CM | POA: Diagnosis not present

## 2023-02-14 DIAGNOSIS — I10 Essential (primary) hypertension: Secondary | ICD-10-CM | POA: Diagnosis not present

## 2023-02-21 ENCOUNTER — Other Ambulatory Visit: Payer: Self-pay | Admitting: Family Medicine

## 2023-03-08 ENCOUNTER — Encounter: Payer: Self-pay | Admitting: Gastroenterology

## 2023-03-08 NOTE — H&P (Signed)
Pre-Procedure H&P   Patient ID: Michael Chung is a 62 y.o. male.  Gastroenterology Provider: Annamaria Helling, DO  Referring Provider: Dawson Bills, NP PCP: Tonia Ghent, MD  Date: 03/09/2023  HPI [name] is a 62 y.o. male who presents today for Colonoscopy for Screening- fhx of colon cancer .  Patient with family history of colon cancer in his brother diagnosed around age 64. The patient last underwent colonoscopy in October 2015 only demonstrating hyperplastic rectal polyps and internal hemorrhoids.  He was recommended to undergo repeat colonoscopy in 2020.  Patient reports regular bowel movements without melena or hematochezia.  Most recent lab work hemoglobin 13.2 MCV 87.6 platelets 199,000 creatinine 1.18  Patient is on Plavix and aspirin which has been held for this procedure (since 03/04/23) TIA in 2022, right carotid stenosis status post stent placement 2022.  Previous tobacco use quit as of 2023 Status post right and left hip replacement March 2023 echocardiogram mild LVH otherwise normal with normal EF   Past Medical History:  Diagnosis Date   Arthritis    Coffee ground emesis    2016 after nsaid use   CVA (cerebral vascular accident)    Dupuytren contracture 06/04/2013   right long, ring, small fingers   GERD (gastroesophageal reflux disease)    uses OTC as needed   Hypertension     Past Surgical History:  Procedure Laterality Date   CAROTID PTA/STENT INTERVENTION Right 08/16/2021   Procedure: CAROTID PTA/STENT INTERVENTION;  Surgeon: Algernon Huxley, MD;  Location: [facility];  Service: Cardiovascular;  Laterality: Right;   COLONOSCOPY     DUPUYTREN CONTRACTURE RELEASE Right 06/27/2013   Procedure: EXCISION DUPUYTRENS RIGHT LONG AND RING  FINGERS;  Surgeon: Cammie Sickle., MD;  Location: [place];  Service: Orthopedics;  Laterality: Right;   LEFT HEART CATH AND CORONARY ANGIOGRAPHY N/A 03/30/2022   Procedure: LEFT  HEART CATH AND CORONARY ANGIOGRAPHY;  Surgeon: Yolonda Kida, MD;  Location: [place] [facility];  Service: Cardiovascular;  Laterality: N/A;   LOWER EXTREMITY ANGIOGRAPHY Left 01/06/2022   Procedure: LOWER EXTREMITY ANGIOGRAPHY;  Surgeon: Algernon Huxley, MD;  Location: [facility];  Service: Cardiovascular;  Laterality: Left;   TOTAL HIP ARTHROPLASTY Left 11/25/2015   Procedure: TOTAL HIP ARTHROPLASTY;  Surgeon: Earnestine Leys, MD;  Location: [facility];  Service: Orthopedics;  Laterality: Left;   TOTAL HIP ARTHROPLASTY Right    2020    Family History Brother- crc ~70y/o No other h/o GI disease or malignancy  Review of Systems  Constitutional:  Negative for activity change, appetite change, chills, diaphoresis, fatigue, fever and unexpected weight change.  HENT:  Negative for trouble swallowing and voice change.   Respiratory:  Negative for shortness of breath and wheezing.   Cardiovascular:  Negative for chest pain, palpitations and leg swelling.  Gastrointestinal:  Negative for abdominal distention, abdominal pain, anal bleeding, blood in stool, constipation, diarrhea, nausea and vomiting.  Musculoskeletal:  Negative for arthralgias and myalgias.  Skin:  Negative for color change and pallor.  Neurological:  Negative for dizziness, syncope and weakness.  Psychiatric/Behavioral:  Negative for confusion. The patient is not nervous/anxious.   All other systems reviewed and are negative.    Medications No current facility-administered medications on file prior to encounter.   Current Outpatient Medications on File Prior to Encounter  Medication Sig Dispense Refill   amLODipine (NORVASC) 10 MG tablet Take 1 tablet (10 mg total)  by mouth daily. 90 tablet 3   hydrochlorothiazide (HYDRODIURIL) 12.5 MG tablet Take 1 tablet (12.5 mg total) by mouth daily. 90 tablet 3   aspirin EC 81 MG EC tablet Take 1 tablet (81 mg total) by mouth daily at 6 (six) AM. Swallow whole. 30 tablet 0    clopidogrel (PLAVIX) 75 MG tablet Take 1 tablet (75 mg total) by mouth daily. 90 tablet 3   cyclobenzaprine (FLEXERIL) 10 MG tablet TAKE 1 TABLET (10 MG TOTAL) BY MOUTH DAILY AS NEEDED FOR MUSCLE SPASMS (SEDATION CAUTION.). 30 tablet 1   gabapentin (NEURONTIN) 400 MG capsule TAKE 1 CAPSULE (400 MG TOTAL) BY MOUTH 3 (THREE) TIMES DAILY AS NEEDED. 270 capsule 1   ibuprofen (ADVIL) 800 MG tablet TAKE 1 TABLET (800 MG TOTAL) BY MOUTH DAILY AS NEEDED (FOR PAIN. TAKE WITH FOOD.). 90 tablet 1   pantoprazole (PROTONIX) 20 MG tablet Take 1 tablet (20 mg total) by mouth daily. 90 tablet 3   tadalafil (CIALIS) 5 MG tablet Take 1 tablet (5 mg total) by mouth daily.      Pertinent medications related to GI and procedure were reviewed by me with the patient prior to the procedure   Current Facility-Administered Medications:    0.9 %  sodium chloride infusion, , Intravenous, Continuous, Annamaria Helling, DO, Last Rate: 20 mL/hr at 03/09/23 0829, New Bag at 03/09/23 0829      Allergies  Allergen Reactions   Citrus Anaphylaxis    Tongue swelling with eating oranges   Nsaids Other (See Comments)    H/o coffee ground emesis but has tolerated nsaid use with concurrent PPI use   Allergies were reviewed by me prior to the procedure  Objective   Body mass index is 30.58 kg/m. Vitals:   03/09/23 0819  BP: (!) 155/110  Pulse: 69  Resp: 17  Temp: (!) 96 F (35.6 C)  TempSrc: Temporal  SpO2: 97%  Weight: 105.1 kg  Height: 6\' 1"  (1.854 m)     Physical Exam Vitals and nursing note reviewed.  Constitutional:      General: He is not in acute distress.    Appearance: Normal appearance. He is not ill-appearing, toxic-appearing or diaphoretic.  HENT:     Head: Normocephalic and atraumatic.     Nose: Nose normal.     Mouth/Throat:     Mouth: Mucous membranes are moist.     Pharynx: Oropharynx is clear.  Eyes:     General: No scleral icterus.    Extraocular Movements: Extraocular movements  intact.  Cardiovascular:     Rate and Rhythm: Normal rate and regular rhythm.     Heart sounds: Normal heart sounds. No murmur heard.    No friction rub. No gallop.  Pulmonary:     Effort: Pulmonary effort is normal. No respiratory distress.     Breath sounds: Normal breath sounds. No wheezing, rhonchi or rales.  Abdominal:     General: Bowel sounds are normal. There is no distension.     Palpations: Abdomen is soft.     Tenderness: There is no abdominal tenderness. There is no guarding or rebound.  Musculoskeletal:     Cervical back: Neck supple.     Right lower leg: No edema.     Left lower leg: No edema.  Skin:    General: Skin is warm and dry.     Coloration: Skin is not jaundiced or pale.  Neurological:     General: No focal deficit present.  Mental Status: He is alert and oriented to person, place, and time. Mental status is at baseline.  Psychiatric:        Mood and Affect: Mood normal.        Behavior: Behavior normal.        Thought Content: Thought content normal.        Judgment: Judgment normal.      Assessment:  [name] is a 62 y.o. male  who presents today for Colonoscopy for Screening-fhx of colon cancer .  Plan:  Colonoscopy with possible intervention today  Colonoscopy with possible biopsy, control of bleeding, polypectomy, and interventions as necessary has been discussed with the patient/patient representative. Informed consent was obtained from the patient/patient representative after explaining the indication, nature, and risks of the procedure including but not limited to death, bleeding, perforation, missed neoplasm/lesions, cardiorespiratory compromise, and reaction to medications. Opportunity for questions was given and appropriate answers were provided. Patient/patient representative has verbalized understanding is amenable to undergoing the procedure.   Annamaria Helling, DO  [facility]  Portions of the record  may have been created with voice recognition software. Occasional wrong-word or 'sound-a-like' substitutions may have occurred due to the inherent limitations of voice recognition software.  Read the chart carefully and recognize, using context, where substitutions may have occurred.

## 2023-03-09 ENCOUNTER — Other Ambulatory Visit: Payer: Self-pay

## 2023-03-09 ENCOUNTER — Ambulatory Visit: Payer: 59 | Admitting: Anesthesiology

## 2023-03-09 ENCOUNTER — Ambulatory Visit
Admission: RE | Admit: 2023-03-09 | Discharge: 2023-03-09 | Disposition: A | Discharge status: Home / Self Care | Payer: 59 | Attending: Gastroenterology | Admitting: Gastroenterology

## 2023-03-09 ENCOUNTER — Encounter: Payer: Self-pay | Admitting: Gastroenterology

## 2023-03-09 ENCOUNTER — Encounter
Admission: RE | Disposition: A | Discharge status: Home / Self Care | Payer: Self-pay | Source: Home / Self Care | Attending: Gastroenterology

## 2023-03-09 DIAGNOSIS — D123 Benign neoplasm of transverse colon: Secondary | ICD-10-CM | POA: Diagnosis not present

## 2023-03-09 DIAGNOSIS — Z955 Presence of coronary angioplasty implant and graft: Secondary | ICD-10-CM | POA: Diagnosis not present

## 2023-03-09 DIAGNOSIS — K649 Unspecified hemorrhoids: Secondary | ICD-10-CM | POA: Diagnosis not present

## 2023-03-09 DIAGNOSIS — Z7902 Long term (current) use of antithrombotics/antiplatelets: Secondary | ICD-10-CM | POA: Insufficient documentation

## 2023-03-09 DIAGNOSIS — Z8719 Personal history of other diseases of the digestive system: Secondary | ICD-10-CM | POA: Insufficient documentation

## 2023-03-09 DIAGNOSIS — D120 Benign neoplasm of cecum: Secondary | ICD-10-CM | POA: Insufficient documentation

## 2023-03-09 DIAGNOSIS — Z87891 Personal history of nicotine dependence: Secondary | ICD-10-CM | POA: Diagnosis not present

## 2023-03-09 DIAGNOSIS — K644 Residual hemorrhoidal skin tags: Secondary | ICD-10-CM | POA: Diagnosis not present

## 2023-03-09 DIAGNOSIS — Z800 Family history of malignant neoplasm of digestive organs: Secondary | ICD-10-CM | POA: Insufficient documentation

## 2023-03-09 DIAGNOSIS — K640 First degree hemorrhoids: Secondary | ICD-10-CM | POA: Diagnosis not present

## 2023-03-09 DIAGNOSIS — K219 Gastro-esophageal reflux disease without esophagitis: Secondary | ICD-10-CM | POA: Insufficient documentation

## 2023-03-09 DIAGNOSIS — Z1211 Encounter for screening for malignant neoplasm of colon: Secondary | ICD-10-CM | POA: Insufficient documentation

## 2023-03-09 DIAGNOSIS — M1990 Unspecified osteoarthritis, unspecified site: Secondary | ICD-10-CM | POA: Insufficient documentation

## 2023-03-09 DIAGNOSIS — K5730 Diverticulosis of large intestine without perforation or abscess without bleeding: Secondary | ICD-10-CM | POA: Insufficient documentation

## 2023-03-09 DIAGNOSIS — I10 Essential (primary) hypertension: Secondary | ICD-10-CM | POA: Insufficient documentation

## 2023-03-09 DIAGNOSIS — Z7982 Long term (current) use of aspirin: Secondary | ICD-10-CM | POA: Insufficient documentation

## 2023-03-09 DIAGNOSIS — K635 Polyp of colon: Secondary | ICD-10-CM | POA: Diagnosis not present

## 2023-03-09 HISTORY — DX: Essential (primary) hypertension: I10

## 2023-03-09 HISTORY — PX: COLONOSCOPY: SHX5424

## 2023-03-09 SURGERY — COLONOSCOPY
Anesthesia: General

## 2023-03-09 MED ORDER — SODIUM CHLORIDE 0.9 % IV SOLN: INTRAVENOUS | Status: DC

## 2023-03-09 MED ORDER — VASOPRESSIN 20 UNIT/ML IV SOLN
INTRAVENOUS | Status: DC | PRN
  Administered 2023-03-09 (×3): 1 [IU] via INTRAVENOUS

## 2023-03-09 MED ORDER — PROPOFOL 10 MG/ML IV BOLUS
INTRAVENOUS | Status: DC | PRN
  Administered 2023-03-09: 70 mg via INTRAVENOUS

## 2023-03-09 MED ORDER — LIDOCAINE HCL (CARDIAC) PF 100 MG/5ML IV SOSY
PREFILLED_SYRINGE | INTRAVENOUS | Status: DC | PRN
  Administered 2023-03-09: 100 mg via INTRAVENOUS

## 2023-03-09 MED ORDER — PROPOFOL 500 MG/50ML IV EMUL
INTRAVENOUS | Status: DC | PRN
  Administered 2023-03-09: 165 ug/kg/min via INTRAVENOUS

## 2023-03-09 NOTE — Op Note (Signed)
[facility] Gastroenterology Patient Name: Michael Chung Procedure Date: 03/09/2023 9:13 AM MRN: [id number] Account #: [id number] Date of Birth: 02-09-1961 Admit Type: Outpatient Age: 62 Room: [facility] ENDO ROOM 1 Gender: Male Note Status: Finalized Instrument Name: Jasper Riling [id number] Procedure:             Colonoscopy Indications:           Screening in patient at increased risk: Family history                         of 1st-degree relative with colorectal cancer Providers:             Rueben Bash, DO Referring MD:          Elveria Rising. Damita Dunnings, MD (Referring MD) Medicines:             Monitored Anesthesia Care Complications:         No immediate complications. Estimated blood loss:                         Minimal. Procedure:             Pre-Anesthesia Assessment:                        - Prior to the procedure, a History and Physical was                         performed, and patient medications and allergies were                         reviewed. The patient is competent. The risks and                         benefits of the procedure and the sedation options and                         risks were discussed with the patient. All questions                         were answered and informed consent was obtained.                         Patient identification and proposed procedure were                         verified by the physician, the nurse, the anesthetist                         and the technician in the endoscopy suite. Mental                         Status Examination: alert and oriented. Airway                         Examination: normal oropharyngeal airway and neck                         mobility. Respiratory Examination: clear to  auscultation. CV Examination: RRR, no murmurs, no S3                         or S4. Prophylactic Antibiotics: The patient does not                         require prophylactic antibiotics. Prior                          Anticoagulants: The patient has taken Plavix                         (clopidogrel), last dose was 5 days prior to                         procedure. ASA Grade Assessment: III - A patient with                         severe systemic disease. After reviewing the risks and                         benefits, the patient was deemed in satisfactory                         condition to undergo the procedure. The anesthesia                         plan was to use monitored anesthesia care (MAC).                         Immediately prior to administration of medications,                         the patient was re-assessed for adequacy to receive                         sedatives. The heart rate, respiratory rate, oxygen                         saturations, blood pressure, adequacy of pulmonary                         ventilation, and response to care were monitored                         throughout the procedure. The physical status of the                         patient was re-assessed after the procedure.                        After obtaining informed consent, the colonoscope was                         passed under direct vision. Throughout the procedure,                         the patient's blood pressure, pulse, and oxygen  saturations were monitored continuously. The                         Colonoscope was introduced through the anus and                         advanced to the the terminal ileum, with                         identification of the appendiceal orifice and IC                         valve. The colonoscopy was performed without                         difficulty. The patient tolerated the procedure well.                         The quality of the bowel preparation was evaluated                         using the BBPS [facility] Bowel Preparation Scale) with                         scores of: Right Colon = 3, Transverse Colon = 3 and                          Left Colon = 3 (entire mucosa seen well with no                         residual staining, small fragments of stool or opaque                         liquid). The total BBPS score equals 9. The terminal                         ileum, ileocecal valve, appendiceal orifice, and                         rectum were photographed. Findings:      Skin tags were found on perianal exam.      The digital rectal exam was normal. Pertinent negatives include normal       sphincter tone.      The terminal ileum appeared normal. Estimated blood loss: none.      Scattered small-mouthed diverticula were found in the entire colon.       Estimated blood loss: none.      Non-bleeding internal hemorrhoids were found during retroflexion. The       hemorrhoids were Grade I (internal hemorrhoids that do not prolapse).       Estimated blood loss: none.      A 1 to 2 mm polyp was found in the cecum. The polyp was sessile. The       polyp was removed with a jumbo cold forceps. Resection and retrieval       were complete. Estimated blood loss was minimal.      An 11 to 13 mm polyp was found in the transverse colon. The polyp was  sessile. The polyp was removed with a cold snare. Resection and       retrieval were complete. Estimated blood loss was minimal.      The exam was otherwise without abnormality on direct and retroflexion       views. Impression:            - Perianal skin tags found on perianal exam.                        - The examined portion of the ileum was normal.                        - Diverticulosis in the entire examined colon.                        - Non-bleeding internal hemorrhoids.                        - One 1 to 2 mm polyp in the cecum, removed with a                         jumbo cold forceps. Resected and retrieved.                        - One 11 to 13 mm polyp in the transverse colon,                         removed with a cold snare. Resected and  retrieved.                        - The examination was otherwise normal on direct and                         retroflexion views. Recommendation:        - Patient has a contact number available for                         emergencies. The signs and symptoms of potential                         delayed complications were discussed with the patient.                         Return to normal activities tomorrow. Written                         discharge instructions were provided to the patient.                        - Discharge patient to home.                        - Resume previous diet.                        - Continue present medications.                        - No aspirin, ibuprofen, naproxen, or other  non-steroidal anti-inflammatory drugs for 4 days after                         polyp removal.                        - Resume Plavix (clopidogrel) at prior dose in 4 days.                         Refer to managing physician for further adjustment of                         therapy.                        - Await pathology results.                        - Repeat colonoscopy for surveillance based on                         pathology results.                        - Return to referring physician as previously                         scheduled.                        - The findings and recommendations were discussed with                         the patient. Procedure Code(s):     --- Professional ---                        [phone], Colonoscopy, flexible; with removal of                         tumor(s), polyp(s), or other lesion(s) by snare                         technique                        45380, 30, Colonoscopy, flexible; with biopsy, single                         or multiple Diagnosis Code(s):     --- Professional ---                        Z80.0, Family history of malignant neoplasm of                         digestive organs                         K64.0, First degree hemorrhoids                        D12.0, Benign neoplasm of cecum  D12.3, Benign neoplasm of transverse colon (hepatic                         flexure or splenic flexure)                        K64.4, Residual hemorrhoidal skin tags                        K57.30, Diverticulosis of large intestine without                         perforation or abscess without bleeding CPT copyright 2022 American Medical Association. All rights reserved. The codes documented in this report are preliminary and upon coder review may  be revised to meet current compliance requirements. Attending Participation:      I personally performed the entire procedure. Volney American, DO Annamaria Helling DO, DO 03/09/2023 10:03:22 AM This report has been signed electronically. Number of Addenda: 0 Note Initiated On: 03/09/2023 9:13 AM Scope Withdrawal Time: 0 hours 24 minutes 34 seconds  Total Procedure Duration: 0 hours 28 minutes 3 seconds  Estimated Blood Loss:  Estimated blood loss was minimal.      [facility]

## 2023-03-09 NOTE — Anesthesia Procedure Notes (Signed)
Procedure Name: General with mask airway Date/Time: 03/09/2023 9:32 AM  Performed by: Kelton Pillar, CRNAPre-anesthesia Checklist: Patient identified, Emergency Drugs available, Suction available and Patient being monitored Patient Re-evaluated:Patient Re-evaluated prior to induction Oxygen Delivery Method: Simple face mask Induction Type: IV induction Placement Confirmation: positive ETCO2, CO2 detector and breath sounds checked- equal and bilateral Dental Injury: Teeth and Oropharynx as per pre-operative assessment

## 2023-03-09 NOTE — Transfer of Care (Signed)
Immediate Anesthesia Transfer of Care Note  Patient: Michael Chung  Procedure(s) Performed: COLONOSCOPY  Patient Location: Endoscopy Unit  Anesthesia Type:General  Level of Consciousness: drowsy and patient cooperative  Airway & Oxygen Therapy: Patient Spontanous Breathing and Patient connected to face mask oxygen  Post-op Assessment: Report given to RN and Post -op Vital signs reviewed and stable  Post vital signs: Reviewed and stable  Last Vitals:  Vitals Value Taken Time  BP 84/64 03/09/23 1005  Temp 36.1 C 03/09/23 1000  Pulse 49 03/09/23 1007  Resp 14 03/09/23 1007  SpO2 100 % 03/09/23 1007  Vitals shown include unvalidated device data.  Last Pain:  Vitals:   03/09/23 1000  TempSrc: Temporal  PainSc: Asleep         Complications: No notable events documented.

## 2023-03-09 NOTE — Interval H&P Note (Signed)
History and Physical Interval Note: Preprocedure H&P from 03/09/23  was reviewed and there was no interval change after seeing and examining the patient.  Written consent was obtained from the patient after discussion of risks, benefits, and alternatives. Patient has consented to proceed with Colonoscopy with possible intervention   03/09/2023 9:20 AM  Michael Chung  has presented today for surgery, with the diagnosis of FH: colon cancer (Z80.0).  The various methods of treatment have been discussed with the patient and family. After consideration of risks, benefits and other options for treatment, the patient has consented to  Procedure(s): COLONOSCOPY (N/A) as a surgical intervention.  The patient's history has been reviewed, patient examined, no change in status, stable for surgery.  I have reviewed the patient's chart and labs.  Questions were answered to the patient's satisfaction.     Annamaria Helling

## 2023-03-09 NOTE — Anesthesia Postprocedure Evaluation (Signed)
Anesthesia Post Note  Patient: SHYLER WEMPE  Procedure(s) Performed: COLONOSCOPY  Patient location during evaluation: Endoscopy Anesthesia Type: General Level of consciousness: awake and alert Pain management: pain level controlled Vital Signs Assessment: post-procedure vital signs reviewed and stable Respiratory status: spontaneous breathing, nonlabored ventilation, respiratory function stable and patient connected to nasal cannula oxygen Cardiovascular status: blood pressure returned to baseline and stable Postop Assessment: no apparent nausea or vomiting Anesthetic complications: no   No notable events documented.   Last Vitals:  Vitals:   03/09/23 1010 03/09/23 1020  BP: 93/79 (!) 101/90  Pulse:  62  Resp:    Temp:    SpO2:  97%    Last Pain:  Vitals:   03/09/23 1030  TempSrc:   PainSc: 0-No pain                 Precious Haws Andrew Blasius

## 2023-03-09 NOTE — Anesthesia Preprocedure Evaluation (Addendum)
Anesthesia Evaluation  Patient identified by MRN, date of birth, ID band Patient awake    Reviewed: Allergy & Precautions, NPO status , Patient's Chart, lab work & pertinent test results  History of Anesthesia Complications Negative for: history of anesthetic complications  Airway Mallampati: III  TM Distance: >3 FB Neck ROM: full    Dental  (+) Chipped   Pulmonary neg shortness of breath, former smoker   Pulmonary exam normal        Cardiovascular Exercise Tolerance: Good hypertension, Normal cardiovascular exam+ dysrhythmias      Neuro/Psych CVA  negative psych ROS   GI/Hepatic Neg liver ROS,GERD  Controlled,,  Endo/Other  negative endocrine ROS    Renal/GU negative Renal ROS  negative genitourinary   Musculoskeletal   Abdominal   Peds  Hematology negative hematology ROS (+)   Anesthesia Other Findings Past Medical History: No date: Arthritis No date: Coffee ground emesis     Comment:  2016 after nsaid use No date: CVA (cerebral vascular accident) 06/04/2013: Dupuytren contracture     Comment:  right long, ring, small fingers No date: GERD (gastroesophageal reflux disease)     Comment:  uses OTC as needed  Past Surgical History: 08/16/2021: CAROTID PTA/STENT INTERVENTION; Right     Comment:  Procedure: CAROTID PTA/STENT INTERVENTION;  Surgeon:               Algernon Huxley, MD;  Location: [place] [facility];                Service: Cardiovascular;  Laterality: Right; 06/27/2013: DUPUYTREN CONTRACTURE RELEASE; Right     Comment:  Procedure: EXCISION DUPUYTRENS RIGHT LONG AND RING                FINGERS;  Surgeon: Cammie Sickle., MD;  Location:               [place];  Service: Orthopedics;                Laterality: Right; 03/30/2022: LEFT HEART CATH AND CORONARY ANGIOGRAPHY; N/A     Comment:  Procedure: LEFT HEART CATH AND CORONARY ANGIOGRAPHY;                Surgeon: Yolonda Kida, MD;  Location: [place]              [facility];  Service: Cardiovascular;  Laterality: N/A; 01/06/2022: LOWER EXTREMITY ANGIOGRAPHY; Left     Comment:  Procedure: LOWER EXTREMITY ANGIOGRAPHY;  Surgeon: Algernon Huxley, MD;  Location: [place] [facility];  Service:               Cardiovascular;  Laterality: Left; 11/25/2015: TOTAL HIP ARTHROPLASTY; Left     Comment:  Procedure: TOTAL HIP ARTHROPLASTY;  Surgeon: Earnestine Leys, MD;  Location: [facility];  Service: Orthopedics;                Laterality: Left; No date: TOTAL HIP ARTHROPLASTY; Right     Comment:  2020     Reproductive/Obstetrics negative OB ROS                             Anesthesia Physical Anesthesia Plan  ASA: 3  Anesthesia Plan: General   Post-op Pain  Management:    Induction: Intravenous  PONV Risk Score and Plan: Propofol infusion and TIVA  Airway Management Planned: Natural Airway and Nasal Cannula  Additional Equipment:   Intra-op Plan:   Post-operative Plan:   Informed Consent: I have reviewed the patients History and Physical, chart, labs and discussed the procedure including the risks, benefits and alternatives for the proposed anesthesia with the patient or authorized representative who has indicated his/her understanding and acceptance.     Dental Advisory Given  Plan Discussed with: Anesthesiologist, CRNA and Surgeon  Anesthesia Plan Comments: (Patient consented for risks of anesthesia including but not limited to:  - adverse reactions to medications - risk of airway placement if required - damage to eyes, teeth, lips or other oral mucosa - nerve damage due to positioning  - sore throat or hoarseness - Damage to heart, brain, nerves, lungs, other parts of body or loss of life  Patient voiced understanding.)       Anesthesia Quick Evaluation

## 2023-03-10 ENCOUNTER — Encounter: Payer: Self-pay | Admitting: Gastroenterology

## 2023-03-10 LAB — SURGICAL PATHOLOGY

## 2023-05-08 ENCOUNTER — Other Ambulatory Visit: Payer: Self-pay | Admitting: Family Medicine

## 2023-05-08 NOTE — Telephone Encounter (Signed)
Patient will be due for CPE in sept; please call to set up appt.

## 2023-05-09 NOTE — Telephone Encounter (Signed)
Spoke to pt, scheduled cpe for 08/24/23

## 2023-06-12 ENCOUNTER — Other Ambulatory Visit: Payer: Self-pay | Admitting: Family Medicine

## 2023-06-12 MED ORDER — IBUPROFEN 800 MG PO TABS: 800.0000 mg | ORAL_TABLET | Freq: Every day | ORAL | 1 refills | Status: DC | PRN

## 2023-06-12 NOTE — Telephone Encounter (Signed)
Last office visit 08/16/22 for CPE.  Last refilled 05/08/23 for #90 with 1 refill.  Next appt: CPE 08/24/2023.

## 2023-06-12 NOTE — Telephone Encounter (Signed)
Prescription Request  06/12/2023  LOV: 08/16/2022  What is the name of the medication or equipment?  ibuprofen (ADVIL) 800 MG tablet   Patient has three tablets left  Have you contacted your pharmacy to request a refill? No   Which pharmacy would you like this sent to?  [id number] - [place], [place] - [address] [place] [address] Phone: [phone] Fax: [phone]    Patient notified that their request is being sent to the clinical staff for review and that they should receive a response within 2 business days.   Please advise at [facility] [phone]

## 2023-07-17 ENCOUNTER — Encounter: Payer: Self-pay | Admitting: Family Medicine

## 2023-07-17 MED ORDER — HYDROCHLOROTHIAZIDE 12.5 MG PO TABS: 12.5000 mg | ORAL_TABLET | Freq: Every day | ORAL | 0 refills | Status: DC

## 2023-07-17 MED ORDER — IBUPROFEN 800 MG PO TABS: 800.0000 mg | ORAL_TABLET | Freq: Every day | ORAL | 1 refills | Status: DC | PRN

## 2023-07-17 NOTE — Telephone Encounter (Signed)
Patient called in regarding this request, states he was never able to pick up medication from this refill date. Patient is requesting this be re ordered, says he has had trouble with the pharmacy. Please advise patient if needed

## 2023-07-17 NOTE — Addendum Note (Signed)
Addended by: Wendie Simmer B on: 07/17/2023 12:43 PM   Modules accepted: Orders

## 2023-07-17 NOTE — Telephone Encounter (Signed)
Error

## 2023-07-17 NOTE — Telephone Encounter (Signed)
New rxs sent. 

## 2023-07-20 ENCOUNTER — Encounter (INDEPENDENT_AMBULATORY_CARE_PROVIDER_SITE_OTHER): Payer: Self-pay

## 2023-08-08 ENCOUNTER — Other Ambulatory Visit: Payer: Self-pay | Admitting: Family Medicine

## 2023-08-15 ENCOUNTER — Other Ambulatory Visit: Payer: Self-pay | Admitting: Family Medicine

## 2023-08-24 ENCOUNTER — Ambulatory Visit (INDEPENDENT_AMBULATORY_CARE_PROVIDER_SITE_OTHER): Payer: 59 | Admitting: Family Medicine

## 2023-08-24 ENCOUNTER — Encounter: Payer: Self-pay | Admitting: Family Medicine

## 2023-08-24 VITALS — BP 136/80 | HR 79 | Temp 97.9°F | Ht 73.0 in | Wt 230.0 lb

## 2023-08-24 DIAGNOSIS — Z0000 Encounter for general adult medical examination without abnormal findings: Secondary | ICD-10-CM | POA: Diagnosis not present

## 2023-08-24 DIAGNOSIS — Z23 Encounter for immunization: Secondary | ICD-10-CM | POA: Diagnosis not present

## 2023-08-24 DIAGNOSIS — Z125 Encounter for screening for malignant neoplasm of prostate: Secondary | ICD-10-CM

## 2023-08-24 DIAGNOSIS — Z7189 Other specified counseling: Secondary | ICD-10-CM

## 2023-08-24 DIAGNOSIS — I10 Essential (primary) hypertension: Secondary | ICD-10-CM

## 2023-08-24 DIAGNOSIS — E785 Hyperlipidemia, unspecified: Secondary | ICD-10-CM

## 2023-08-24 DIAGNOSIS — M549 Dorsalgia, unspecified: Secondary | ICD-10-CM

## 2023-08-24 DIAGNOSIS — Z8673 Personal history of transient ischemic attack (TIA), and cerebral infarction without residual deficits: Secondary | ICD-10-CM

## 2023-08-24 MED ORDER — CLOPIDOGREL BISULFATE 75 MG PO TABS: 75.0000 mg | ORAL_TABLET | Freq: Every day | ORAL | 3 refills | Status: DC

## 2023-08-24 MED ORDER — AMLODIPINE BESYLATE 10 MG PO TABS: 10.0000 mg | ORAL_TABLET | Freq: Every day | ORAL | 3 refills | Status: DC

## 2023-08-24 MED ORDER — PANTOPRAZOLE SODIUM 20 MG PO TBEC: 20.0000 mg | DELAYED_RELEASE_TABLET | Freq: Every day | ORAL | 3 refills | Status: DC

## 2023-08-24 MED ORDER — HYDROCHLOROTHIAZIDE 12.5 MG PO TABS: 12.5000 mg | ORAL_TABLET | Freq: Every day | ORAL | 3 refills | Status: DC

## 2023-08-24 NOTE — Patient Instructions (Addendum)
Go to the lab on the way out.   If you have mychart we'll likely use that to update you.    Take care.  Glad to see you.  Let me know if you want to get lung cancer screening set up.

## 2023-08-24 NOTE — Progress Notes (Signed)
CPE- See plan.  Routine anticipatory guidance given to patient.  See health maintenance.  The possibility exists that previously documented standard health maintenance information may have been brought forward from a previous encounter into this note.  If needed, that same information has been updated to reflect the current situation based on today's encounter.    Tetanus 2016 Flu done 2024 PNA d/w pt. Shingles done prev covid vaccine prev done Colonoscopy 2024 Prostate cancer screening pending 2024 Living will d/w pt.  Wife designated if patient were incapacitated.   Diet and exercise d/w pt. Not due for AAA screening.    Lung cancer screening d/w pt.  He can consider.    Elevated Cholesterol: Using medications without problems: yes Muscle aches: no Diet compliance: d/w pt.  Exercise: d/w pt.   Hypertension:    Using medication without problems or lightheadedness:  yes Chest pain with exertion: no Edema:no Short of breath:no Labs pending.  Tolerating prn use (not every day) nsaid with PPI concurrently.  Still taking gabapentin for his back, used as needed.  No ADE on med.    H/o CVA on aspirin and plavix.  Still on statin.  No new sx.    PMH and SH reviewed  Meds, vitals, and allergies reviewed.   ROS: Per HPI.  Unless specifically indicated otherwise in HPI, the patient denies:  General: fever. Eyes: acute vision changes ENT: sore throat Cardiovascular: chest pain Respiratory: SOB GI: vomiting GU: dysuria Musculoskeletal: acute back pain Derm: acute rash Neuro: acute motor dysfunction Psych: worsening mood Endocrine: polydipsia Heme: bleeding Allergy: hayfever  GEN: nad, alert and oriented HEENT: ncat NECK: supple w/o LA CV: rrr. PULM: ctab, no inc wob ABD: soft, +bs EXT: no edema SKIN: no acute rash

## 2023-08-25 LAB — CBC WITH DIFFERENTIAL/PLATELET
Basophils Absolute: 0 10*3/uL (ref 0.0–0.1)
Basophils Relative: 0.9 % (ref 0.0–3.0)
Eosinophils Absolute: 0.1 10*3/uL (ref 0.0–0.7)
Eosinophils Relative: 2.8 % (ref 0.0–5.0)
HCT: 40.3 % (ref 39.0–52.0)
Hemoglobin: 13.4 g/dL (ref 13.0–17.0)
Lymphocytes Relative: 37.9 % (ref 12.0–46.0)
Lymphs Abs: 1.7 10*3/uL (ref 0.7–4.0)
MCHC: 33.3 g/dL (ref 30.0–36.0)
MCV: 89.2 fl (ref 78.0–100.0)
Monocytes Absolute: 0.8 10*3/uL (ref 0.1–1.0)
Monocytes Relative: 19 % — ABNORMAL HIGH (ref 3.0–12.0)
Neutro Abs: 1.8 10*3/uL (ref 1.4–7.7)
Neutrophils Relative %: 39.4 % — ABNORMAL LOW (ref 43.0–77.0)
Platelets: 229 10*3/uL (ref 150.0–400.0)
RBC: 4.52 Mil/uL (ref 4.22–5.81)
RDW: 14 % (ref 11.5–15.5)
WBC: 4.4 10*3/uL (ref 4.0–10.5)

## 2023-08-25 LAB — LIPID PANEL
Cholesterol: 120 mg/dL (ref 0–200)
HDL: 29.9 mg/dL — ABNORMAL LOW (ref >39.00)
LDL Cholesterol: 71 mg/dL (ref 0–99)
NonHDL: 90.43
Total CHOL/HDL Ratio: 4
Triglycerides: 99 mg/dL (ref 0.0–149.0)
VLDL: 19.8 mg/dL (ref 0.0–40.0)

## 2023-08-25 LAB — COMPREHENSIVE METABOLIC PANEL
ALT: 10 U/L (ref 0–53)
AST: 18 U/L (ref 0–37)
Albumin: 4.3 g/dL (ref 3.5–5.2)
Alkaline Phosphatase: 56 U/L (ref 39–117)
BUN: 20 mg/dL (ref 6–23)
CO2: 29 mEq/L (ref 19–32)
Calcium: 9.5 mg/dL (ref 8.4–10.5)
Chloride: 104 mEq/L (ref 96–112)
Creatinine, Ser: 1.26 mg/dL (ref 0.40–1.50)
GFR: 61.42 mL/min (ref >60.00)
Glucose, Bld: 87 mg/dL (ref 70–99)
Potassium: 3.5 mEq/L (ref 3.5–5.1)
Sodium: 141 mEq/L (ref 135–145)
Total Bilirubin: 0.7 mg/dL (ref 0.2–1.2)
Total Protein: 7.1 g/dL (ref 6.0–8.3)

## 2023-08-25 LAB — PSA: PSA: 0.24 ng/mL (ref 0.10–4.00)

## 2023-08-27 DIAGNOSIS — M549 Dorsalgia, unspecified: Secondary | ICD-10-CM | POA: Insufficient documentation

## 2023-08-27 DIAGNOSIS — E785 Hyperlipidemia, unspecified: Secondary | ICD-10-CM | POA: Insufficient documentation

## 2023-08-27 NOTE — Assessment & Plan Note (Signed)
Continue amlodipine.  Continue to record those up.  Continue work on diet and exercise.

## 2023-08-27 NOTE — Assessment & Plan Note (Signed)
Continue atorvastatin.  See notes on labs. ?

## 2023-08-27 NOTE — Assessment & Plan Note (Signed)
Living will d/w pt.  Wife designated if patient were incapacitated.   ?

## 2023-08-27 NOTE — Assessment & Plan Note (Signed)
H/o CVA on aspirin and plavix.  Still on statin.  No new sx.

## 2023-08-27 NOTE — Assessment & Plan Note (Signed)
Tolerating prn use (not every day) nsaid with PPI concurrently.  Still taking gabapentin for his back, used as needed.  No ADE on med.   Continue as is.  He agrees.

## 2023-08-27 NOTE — Assessment & Plan Note (Signed)
Tetanus 2016 Flu done 2024 PNA d/w pt. Shingles done prev covid vaccine prev done Colonoscopy 2024 Prostate cancer screening pending 2024 Living will d/w pt.  Wife designated if patient were incapacitated.   Diet and exercise d/w pt. Not due for AAA screening.    Lung cancer screening d/w pt.  He can consider.

## 2024-02-08 DIAGNOSIS — Z6831 Body mass index (BMI) 31.0-31.9, adult: Secondary | ICD-10-CM | POA: Diagnosis not present

## 2024-02-08 DIAGNOSIS — F17200 Nicotine dependence, unspecified, uncomplicated: Secondary | ICD-10-CM | POA: Diagnosis not present

## 2024-02-08 DIAGNOSIS — I739 Peripheral vascular disease, unspecified: Secondary | ICD-10-CM | POA: Diagnosis not present

## 2024-02-08 DIAGNOSIS — E66811 Obesity, class 1: Secondary | ICD-10-CM | POA: Diagnosis not present

## 2024-02-08 DIAGNOSIS — G459 Transient cerebral ischemic attack, unspecified: Secondary | ICD-10-CM | POA: Diagnosis not present

## 2024-02-08 DIAGNOSIS — R0683 Snoring: Secondary | ICD-10-CM | POA: Diagnosis not present

## 2024-02-08 DIAGNOSIS — I10 Essential (primary) hypertension: Secondary | ICD-10-CM | POA: Diagnosis not present

## 2024-02-08 DIAGNOSIS — K219 Gastro-esophageal reflux disease without esophagitis: Secondary | ICD-10-CM | POA: Diagnosis not present

## 2024-02-08 DIAGNOSIS — E6609 Other obesity due to excess calories: Secondary | ICD-10-CM | POA: Diagnosis not present

## 2024-02-22 ENCOUNTER — Other Ambulatory Visit (HOSPITAL_COMMUNITY): Payer: Self-pay

## 2024-03-04 ENCOUNTER — Other Ambulatory Visit: Payer: Self-pay | Admitting: Family Medicine

## 2024-07-19 ENCOUNTER — Other Ambulatory Visit: Payer: Self-pay | Admitting: Family Medicine

## 2024-08-26 ENCOUNTER — Other Ambulatory Visit: Payer: Self-pay | Admitting: Family Medicine

## 2024-08-26 ENCOUNTER — Ambulatory Visit (INDEPENDENT_AMBULATORY_CARE_PROVIDER_SITE_OTHER): Admitting: Family Medicine

## 2024-08-26 ENCOUNTER — Encounter: Payer: Self-pay | Admitting: Family Medicine

## 2024-08-26 VITALS — BP 144/90 | HR 71 | Temp 99.2°F | Ht 74.37 in | Wt 236.8 lb

## 2024-08-26 DIAGNOSIS — I6529 Occlusion and stenosis of unspecified carotid artery: Secondary | ICD-10-CM

## 2024-08-26 DIAGNOSIS — E785 Hyperlipidemia, unspecified: Secondary | ICD-10-CM

## 2024-08-26 DIAGNOSIS — Z0000 Encounter for general adult medical examination without abnormal findings: Secondary | ICD-10-CM | POA: Diagnosis not present

## 2024-08-26 DIAGNOSIS — I739 Peripheral vascular disease, unspecified: Secondary | ICD-10-CM

## 2024-08-26 DIAGNOSIS — Z23 Encounter for immunization: Secondary | ICD-10-CM

## 2024-08-26 DIAGNOSIS — I10 Essential (primary) hypertension: Secondary | ICD-10-CM | POA: Diagnosis not present

## 2024-08-26 DIAGNOSIS — Z7189 Other specified counseling: Secondary | ICD-10-CM

## 2024-08-26 DIAGNOSIS — Z125 Encounter for screening for malignant neoplasm of prostate: Secondary | ICD-10-CM | POA: Diagnosis not present

## 2024-08-26 DIAGNOSIS — M549 Dorsalgia, unspecified: Secondary | ICD-10-CM

## 2024-08-26 DIAGNOSIS — M720 Palmar fascial fibromatosis [Dupuytren]: Secondary | ICD-10-CM

## 2024-08-26 DIAGNOSIS — R399 Unspecified symptoms and signs involving the genitourinary system: Secondary | ICD-10-CM

## 2024-08-26 MED ORDER — ATORVASTATIN CALCIUM 80 MG PO TABS: ORAL_TABLET | ORAL | 3 refills | Status: AC

## 2024-08-26 MED ORDER — HYDROCHLOROTHIAZIDE 12.5 MG PO TABS: 12.5000 mg | ORAL_TABLET | Freq: Every day | ORAL | 3 refills | Status: AC

## 2024-08-26 MED ORDER — TADALAFIL 5 MG PO TABS: 5.0000 mg | ORAL_TABLET | Freq: Every day | ORAL | 3 refills | Status: AC

## 2024-08-26 MED ORDER — CYCLOBENZAPRINE HCL 10 MG PO TABS: 10.0000 mg | ORAL_TABLET | Freq: Every day | ORAL | 1 refills | Status: DC | PRN

## 2024-08-26 MED ORDER — CLOPIDOGREL BISULFATE 75 MG PO TABS: 75.0000 mg | ORAL_TABLET | Freq: Every day | ORAL | 3 refills | Status: AC

## 2024-08-26 MED ORDER — PANTOPRAZOLE SODIUM 20 MG PO TBEC: 20.0000 mg | DELAYED_RELEASE_TABLET | Freq: Every day | ORAL | 3 refills | Status: AC

## 2024-08-26 MED ORDER — GABAPENTIN 400 MG PO CAPS: 400.0000 mg | ORAL_CAPSULE | Freq: Three times a day (TID) | ORAL | 3 refills | Status: AC | PRN

## 2024-08-26 MED ORDER — IBUPROFEN 800 MG PO TABS: 800.0000 mg | ORAL_TABLET | Freq: Every day | ORAL | 3 refills | Status: DC | PRN

## 2024-08-26 MED ORDER — AMLODIPINE BESYLATE 10 MG PO TABS: 10.0000 mg | ORAL_TABLET | Freq: Every day | ORAL | 3 refills | Status: AC

## 2024-08-26 NOTE — Progress Notes (Unsigned)
 CPE- See plan.  Routine anticipatory guidance given to patient.  See health maintenance.  The possibility exists that previously documented standard health maintenance information may have been brought forward from a previous encounter into this note.  If needed, that same information has been updated to reflect the current situation based on today's encounter.    Tetanus 2016 Flu done 2025 PNA d/w pt. Shingles done prev covid vaccine prev done Colonoscopy 2024 Prostate cancer screening pending 2025 Living will d/w pt.  Wife designated if patient were incapacitated.   Diet and exercise d/w pt. Not due for AAA screening.     Lung cancer screening d/w pt.  He can consider.    He saw cardiology this year.  D/w pt about vascular follow up.  No leg cramping with walking.  He has decreased but palpable DP pulses w/o skin breakdown. H/o carotid stent.   No nitroglycerin use.  Elevated Cholesterol: Using medications without problems: yes Muscle aches: no Diet compliance: d/w pt.  Exercise: d/w pt.    Hypertension:               Using medication without problems or lightheadedness:  yes Chest pain with exertion: no Edema:no Short of breath:no Labs pending. BP is lower at home.  Usually 120-130s SBP per patient report.    He had been tolerating prn use (not every day) nsaid with PPI concurrently.  Still taking gabapentin  for his back, used as needed.  No ADE on med.     H/o CVA on aspirin  and plavix .  Still on statin.  No new sx.    Variable nocturia noted, d/w pt about limiting fluid before bed.  Stream is still good.    PMH and SH reviewed  Meds, vitals, and allergies reviewed.   ROS: Per HPI.  Unless specifically indicated otherwise in HPI, the patient denies:  General: fever. Eyes: acute vision changes ENT: sore throat Cardiovascular: chest pain Respiratory: SOB GI: vomiting GU: dysuria Musculoskeletal: acute back pain Derm: acute rash Neuro: acute motor  dysfunction Psych: worsening mood Endocrine: polydipsia Heme: bleeding Allergy: hayfever  GEN: nad, alert and oriented HEENT: mucous membranes moist NECK: supple w/o LA CV: rrr. PULM: ctab, no inc wob ABD: soft, +bs EXT: no edema SKIN: no acute rash L 4th finger contracture.  He has decreased but palpable dorsalis pedis pulses.

## 2024-08-26 NOTE — Patient Instructions (Addendum)
 Go to the lab on the way out.   If you have mychart we'll likely use that to update you.    Take care.  Glad to see you. You should get a call about ortho and vascular appointments.  Update me as needed.

## 2024-08-27 LAB — CBC WITH DIFFERENTIAL/PLATELET
Basophils Absolute: 0 K/uL (ref 0.0–0.1)
Basophils Relative: 1.1 % (ref 0.0–3.0)
Eosinophils Absolute: 0.2 K/uL (ref 0.0–0.7)
Eosinophils Relative: 4.1 % (ref 0.0–5.0)
HCT: 37.1 % — ABNORMAL LOW (ref 39.0–52.0)
Hemoglobin: 12.6 g/dL — ABNORMAL LOW (ref 13.0–17.0)
Lymphocytes Relative: 39.2 % (ref 12.0–46.0)
Lymphs Abs: 1.7 K/uL (ref 0.7–4.0)
MCHC: 34 g/dL (ref 30.0–36.0)
MCV: 89.1 fl (ref 78.0–100.0)
Monocytes Absolute: 0.7 K/uL (ref 0.1–1.0)
Monocytes Relative: 16.9 % — ABNORMAL HIGH (ref 3.0–12.0)
Neutro Abs: 1.7 K/uL (ref 1.4–7.7)
Neutrophils Relative %: 38.7 % — ABNORMAL LOW (ref 43.0–77.0)
Platelets: 214 K/uL (ref 150.0–400.0)
RBC: 4.16 Mil/uL — ABNORMAL LOW (ref 4.22–5.81)
RDW: 13.8 % (ref 11.5–15.5)
WBC: 4.3 K/uL (ref 4.0–10.5)

## 2024-08-27 LAB — COMPREHENSIVE METABOLIC PANEL WITH GFR
ALT: 11 U/L (ref 0–53)
AST: 19 U/L (ref 0–37)
Albumin: 4.3 g/dL (ref 3.5–5.2)
Alkaline Phosphatase: 53 U/L (ref 39–117)
BUN: 17 mg/dL (ref 6–23)
CO2: 30 meq/L (ref 19–32)
Calcium: 9.5 mg/dL (ref 8.4–10.5)
Chloride: 105 meq/L (ref 96–112)
Creatinine, Ser: 1.15 mg/dL (ref 0.40–1.50)
GFR: 68.05 mL/min (ref >60.00)
Glucose, Bld: 81 mg/dL (ref 70–99)
Potassium: 3.3 meq/L — ABNORMAL LOW (ref 3.5–5.1)
Sodium: 142 meq/L (ref 135–145)
Total Bilirubin: 0.5 mg/dL (ref 0.2–1.2)
Total Protein: 6.6 g/dL (ref 6.0–8.3)

## 2024-08-27 LAB — LIPID PANEL
Cholesterol: 107 mg/dL (ref 0–200)
HDL: 28 mg/dL — ABNORMAL LOW (ref >39.00)
LDL Cholesterol: 64 mg/dL (ref 0–99)
NonHDL: 79.39
Total CHOL/HDL Ratio: 4
Triglycerides: 78 mg/dL (ref 0.0–149.0)
VLDL: 15.6 mg/dL (ref 0.0–40.0)

## 2024-08-27 LAB — PSA: PSA: 0.75 ng/mL (ref 0.10–4.00)

## 2024-08-28 ENCOUNTER — Ambulatory Visit: Payer: Self-pay | Admitting: Family Medicine

## 2024-08-28 DIAGNOSIS — R399 Unspecified symptoms and signs involving the genitourinary system: Secondary | ICD-10-CM | POA: Insufficient documentation

## 2024-08-28 DIAGNOSIS — I739 Peripheral vascular disease, unspecified: Secondary | ICD-10-CM | POA: Insufficient documentation

## 2024-08-28 DIAGNOSIS — D649 Anemia, unspecified: Secondary | ICD-10-CM

## 2024-08-28 NOTE — Assessment & Plan Note (Signed)
 Tetanus 2016 Flu done 2025 PNA d/w pt. Shingles done prev covid vaccine prev done Colonoscopy 2024 Prostate cancer screening pending 2025 Living will d/w pt.  Wife designated if patient were incapacitated.   Diet and exercise d/w pt. Not due for AAA screening.

## 2024-08-28 NOTE — Assessment & Plan Note (Signed)
 Labs pending. BP is lower at home.  Usually 120-130s SBP per patient report.  Continue amlodipine  and hydrochlorothiazide .

## 2024-08-28 NOTE — Assessment & Plan Note (Signed)
 D/w pt about vascular follow up.   H/o carotid stent.  No new neurologic symptoms.  Continue amlodipine  aspirin  Plavix  Lipitor hydrochlorothiazide .

## 2024-08-28 NOTE — Assessment & Plan Note (Signed)
 See notes on labs.  Continue atorvastatin .  Continue work on diet and exercise.

## 2024-08-28 NOTE — Assessment & Plan Note (Signed)
 Living will d/w pt.  Wife designated if patient were incapacitated.   ?

## 2024-08-28 NOTE — Assessment & Plan Note (Signed)
 Variable nocturia noted, d/w pt about limiting fluid before bed.  Stream is still good.  Reasonable to use Cialis .

## 2024-08-28 NOTE — Assessment & Plan Note (Signed)
 D/w pt about vascular follow up.  No leg cramping with walking.  He has decreased but palpable DP pulses w/o skin breakdown. Continue amlodipine  aspirin  Plavix  Lipitor hydrochlorothiazide .  Referral placed.

## 2024-08-28 NOTE — Assessment & Plan Note (Signed)
 He had been tolerating prn use (not every day) nsaid with PPI concurrently.  Still taking gabapentin  for his back, used as needed.  No ADE on med.   Would continue as is.

## 2024-08-28 NOTE — Assessment & Plan Note (Signed)
Refer to orthopedics 

## 2024-09-13 ENCOUNTER — Encounter: Payer: Self-pay | Admitting: *Deleted

## 2024-10-14 ENCOUNTER — Encounter: Payer: Self-pay | Admitting: *Deleted

## 2024-10-16 ENCOUNTER — Other Ambulatory Visit (INDEPENDENT_AMBULATORY_CARE_PROVIDER_SITE_OTHER): Payer: Self-pay | Admitting: Vascular Surgery

## 2024-10-16 DIAGNOSIS — I6523 Occlusion and stenosis of bilateral carotid arteries: Secondary | ICD-10-CM

## 2024-10-18 ENCOUNTER — Ambulatory Visit (INDEPENDENT_AMBULATORY_CARE_PROVIDER_SITE_OTHER): Admitting: Nurse Practitioner

## 2024-10-18 ENCOUNTER — Ambulatory Visit (INDEPENDENT_AMBULATORY_CARE_PROVIDER_SITE_OTHER)

## 2024-10-18 ENCOUNTER — Encounter (INDEPENDENT_AMBULATORY_CARE_PROVIDER_SITE_OTHER): Payer: Self-pay | Admitting: Nurse Practitioner

## 2024-10-18 VITALS — BP 146/88 | HR 64 | Resp 17 | Ht 74.0 in | Wt 236.0 lb

## 2024-10-18 DIAGNOSIS — I10 Essential (primary) hypertension: Secondary | ICD-10-CM | POA: Diagnosis not present

## 2024-10-18 DIAGNOSIS — I739 Peripheral vascular disease, unspecified: Secondary | ICD-10-CM | POA: Diagnosis not present

## 2024-10-18 DIAGNOSIS — I6523 Occlusion and stenosis of bilateral carotid arteries: Secondary | ICD-10-CM | POA: Diagnosis not present

## 2024-10-19 ENCOUNTER — Encounter (INDEPENDENT_AMBULATORY_CARE_PROVIDER_SITE_OTHER): Payer: Self-pay | Admitting: Nurse Practitioner

## 2024-10-19 NOTE — Progress Notes (Signed)
 Subjective:    Patient ID: Michael Chung, male    DOB: [birth year], 63 y.o.   MRN: [id number] Chief Complaint  Patient presents with   Follow-up    Carotid. Stenosis of carotid artery, unspecified laterality. Cleatus Molly.    HPI  Discussed the use of AI scribe software for clinical note transcription with the patient, who gave verbal consent to proceed.  History of Present Illness Michael Chung is a 63 year old male with carotid artery disease who presents for follow-up evaluation of his vascular health. He was referred by Dr. Cleatus for evaluation of his carotid disease.  He reports being told he has carotid artery disease with a 1-39% stenosis on the right side and no significant findings on the left side. He recalls that previous studies of his carotid arteries were reported to him as near-normal. He is asymptomatic, with no signs of stroke or TIA, such as slurred speech, facial drooping, or sudden vision changes. He reports that his carotid stenosis has been monitored and has remained below 60% according to prior evaluations.  He has a history of peripheral artery disease. He experiences mild leg pain after walking a certain distance, but it is not severe enough to limit daily activities. No pain at rest, wounds, or ulcers on his feet. No pain at night that wakes him up, nor any color changes in his feet.  His angiogram in 2023 noted an occlusion of the left external iliac artery as well as the left common femoral artery.  He has an occlusion of the left SFA which is reconstituted at the popliteal artery above the knee.  He has a history of a mini-stroke several years ago, which led to his initial consultation. He does not smoke or drink, which may contribute to the slow progression of his vascular disease.  He experiences occasional leg cramps, which he manages with tonic water. He is currently on Lipitor and aspirin  for his vascular health.   Results RADIOLOGY Carotid Ultrasound:  Right carotid artery stenosis 1-39%, left carotid artery without significant stenosis.   Review of Systems  Skin:  Negative for wound.  Neurological:  Negative for dizziness and headaches.  All other systems reviewed and are negative.      Objective:   Physical Exam Vitals reviewed.  HENT:     Head: Normocephalic.  Neck:     Vascular: No carotid bruit.  Cardiovascular:     Rate and Rhythm: Normal rate.  Pulmonary:     Effort: Pulmonary effort is normal.  Skin:    General: Skin is warm and dry.  Neurological:     Mental Status: He is alert and oriented to person, place, and time.  Psychiatric:        Mood and Affect: Mood normal.        Behavior: Behavior normal.        Thought Content: Thought content normal.        Judgment: Judgment normal.     BP (!) 146/88   Pulse 64   Resp 17   Ht 6' 2 (1.88 m)   Wt 236 lb (107 kg) Comment: per prior weight did not weigh in clinic  BMI 30.30 kg/m   Past Medical History:  Diagnosis Date   Arthritis    Coffee ground emesis    2016 after nsaid use   CVA (cerebral vascular accident) (HCC)    Dupuytren contracture 06/04/2013   right long, ring, small fingers   GERD (gastroesophageal  reflux disease)    uses OTC as needed   Hypertension     Social History   Socioeconomic History   Marital status: Married    Spouse name: Reena   Number of children: 3   Years of education: Not on file   Highest education level: Not on file  Occupational History   Occupation: Administrator  Tobacco Use   Smoking status: Former    Current packs/day: 0.00    Average packs/day: 1 pack/day for 20.0 years (20.0 ttl pk-yrs)    Types: Cigarettes    Start date: 06/2001    Quit date: 06/2021    Years since quitting: 3.3   Smokeless tobacco: Never  Vaping Use   Vaping status: Never Used  Substance and Sexual Activity   Alcohol use: No    Comment: none   Drug use: No   Sexual activity: Not on file  Other Topics Concern   Not on file   Social History Narrative   Education:  11th grade   Works at [employer]    Married 1987   Enjoys singing   Social Drivers of Corporate Investment Banker Strain: Not on file  Food Insecurity: Not on file  Transportation Needs: Not on file  Physical Activity: Not on file  Stress: Not on file  Social Connections: Not on file  Intimate Partner Violence: Not on file    Past Surgical History:  Procedure Laterality Date   CAROTID PTA/STENT INTERVENTION Right 08/16/2021   Procedure: CAROTID PTA/STENT INTERVENTION;  Surgeon: Marea Selinda RAMAN, MD;  Location: [facility];  Service: Cardiovascular;  Laterality: Right;   COLONOSCOPY     COLONOSCOPY N/A 03/09/2023   Procedure: COLONOSCOPY;  Surgeon: Onita Elspeth Sharper, DO;  Location: [facility];  Service: Gastroenterology;  Laterality: N/A;   DUPUYTREN CONTRACTURE RELEASE Right 06/27/2013   Procedure: EXCISION DUPUYTRENS RIGHT LONG AND RING  FINGERS;  Surgeon: Lamar LULLA Leonor Mickey., MD;  Location: [facility];  Service: Orthopedics;  Laterality: Right;   LEFT HEART CATH AND CORONARY ANGIOGRAPHY N/A 03/30/2022   Procedure: LEFT HEART CATH AND CORONARY ANGIOGRAPHY;  Surgeon: Florencio Cara BIRCH, MD;  Location: [facility];  Service: Cardiovascular;  Laterality: N/A;   LOWER EXTREMITY ANGIOGRAPHY Left 01/06/2022   Procedure: LOWER EXTREMITY ANGIOGRAPHY;  Surgeon: Marea Selinda RAMAN, MD;  Location: [facility];  Service: Cardiovascular;  Laterality: Left;   TOTAL HIP ARTHROPLASTY Left 11/25/2015   Procedure: TOTAL HIP ARTHROPLASTY;  Surgeon: Kayla Pinal, MD;  Location: [facility];  Service: Orthopedics;  Laterality: Left;   TOTAL HIP ARTHROPLASTY Right    2020    Family History  Problem Relation Age of Onset   Hypertension Father    Diabetes Father    Heart Problems Father    Diabetes Mother    Hypertension Mother    Dementia Mother    Colon cancer Brother    Colon cancer Brother    Prostate cancer  Neg Hx     Allergies  Allergen Reactions   Citrus Anaphylaxis    Tongue swelling with eating oranges   Nsaids Other (See Comments)    H/o coffee ground emesis but has tolerated nsaid use with concurrent PPI use       Latest Ref Rng & Units 08/26/2024    4:28 PM 08/24/2023    4:10 PM 08/10/2022    7:51 AM  CBC  WBC 4.0 - 10.5 K/uL 4.3  4.4  3.7  Hemoglobin 13.0 - 17.0 g/dL 87.3  86.5  86.7   Hematocrit 39.0 - 52.0 % 37.1  40.3  39.7   Platelets 150.0 - 400.0 K/uL 214.0  229.0  199.0       CMP     Component Value Date/Time   NA 142 08/26/2024 1628   K 3.3 (L) 08/26/2024 1628   CL 105 08/26/2024 1628   CO2 30 08/26/2024 1628   GLUCOSE 81 08/26/2024 1628   BUN 17 08/26/2024 1628   CREATININE 1.15 08/26/2024 1628   CREATININE 1.06 08/27/2021 1607   CALCIUM  9.5 08/26/2024 1628   PROT 6.6 08/26/2024 1628   ALBUMIN 4.3 08/26/2024 1628   AST 19 08/26/2024 1628   ALT 11 08/26/2024 1628   ALKPHOS 53 08/26/2024 1628   BILITOT 0.5 08/26/2024 1628   GFR 68.05 08/26/2024 1628   GFRNONAA >60 03/07/2022 0952     No results found.     Assessment & Plan:   1. Bilateral carotid artery stenosis (Primary) Assessment and Plan Assessment & Plan Right carotid artery atherosclerosis with history of transient ischemic attack Right carotid artery atherosclerosis with 1-39% stenosis. No significant stenosis on the left. No symptoms warranting intervention. Previous TIA with stenosis <50%, current stenosis <60%. Risks of intervention outweigh benefits. - Monitor carotid artery stenosis with annual ultrasound. - Educated on TIA and stroke symptoms, emphasizing immediate emergency care if symptoms occur.      2. PAD (peripheral artery disease) Peripheral arterial disease of lower extremities Peripheral arterial disease. No significant pain or wounds. No intervention required as symptoms do not interfere with daily activities. Monitoring advised for progression. - Monitor symptoms and  blood flow in lower extremities. - Educated on signs necessitating intervention, such as severe claudication, non-healing wounds, or nocturnal pain.  3. Primary hypertension Continue antihypertensive medications as already ordered, these medications have been reviewed and there are no changes at this time.   Current Outpatient Medications on File Prior to Visit  Medication Sig Dispense Refill   amLODipine  (NORVASC ) 10 MG tablet Take 1 tablet (10 mg total) by mouth daily. 90 tablet 3   aspirin  EC 81 MG EC tablet Take 1 tablet (81 mg total) by mouth daily at 6 (six) AM. Swallow whole. 30 tablet 0   atorvastatin  (LIPITOR) 80 MG tablet TAKE 1 TABLET BY MOUTH EVERYDAY AT BEDTIME 90 tablet 3   clopidogrel  (PLAVIX ) 75 MG tablet Take 1 tablet (75 mg total) by mouth daily. 90 tablet 3   gabapentin  (NEURONTIN ) 400 MG capsule Take 1 capsule (400 mg total) by mouth 3 (three) times daily as needed. 270 capsule 3   hydrochlorothiazide  (HYDRODIURIL ) 12.5 MG tablet Take 1 tablet (12.5 mg total) by mouth daily. 90 tablet 3   ibuprofen  (ADVIL ) 800 MG tablet Take 1 tablet (800 mg total) by mouth daily as needed (for pain.  take with food.). 90 tablet 3   pantoprazole  (PROTONIX ) 20 MG tablet Take 1 tablet (20 mg total) by mouth daily. 90 tablet 3   tadalafil  (CIALIS ) 5 MG tablet Take 1 tablet (5 mg total) by mouth daily. 90 tablet 3   cyclobenzaprine  (FLEXERIL ) 10 MG tablet Take 1 tablet (10 mg total) by mouth daily as needed for muscle spasms (Sedation caution.). (Patient not taking: Reported on 10/18/2024) 30 tablet 1   No current facility-administered medications on file prior to visit.    There are no Patient Instructions on file for this visit. No follow-ups on file.   Reni Hausner E Zechariah Bissonnette, NP

## 2024-11-06 DIAGNOSIS — M720 Palmar fascial fibromatosis [Dupuytren]: Secondary | ICD-10-CM | POA: Diagnosis not present

## 2024-11-22 DIAGNOSIS — M720 Palmar fascial fibromatosis [Dupuytren]: Secondary | ICD-10-CM | POA: Diagnosis not present

## 2024-12-08 ENCOUNTER — Other Ambulatory Visit: Payer: Self-pay | Admitting: Family Medicine

## 2024-12-28 ENCOUNTER — Other Ambulatory Visit: Payer: Self-pay | Admitting: Family Medicine

## 2024-12-30 NOTE — Telephone Encounter (Signed)
 Flexeril  Last filled:  11/28/24/, #30 Last OV:  08/26/24, CPE Next OV:  none

## 2025-10-17 ENCOUNTER — Encounter (INDEPENDENT_AMBULATORY_CARE_PROVIDER_SITE_OTHER)

## 2025-10-17 ENCOUNTER — Ambulatory Visit (INDEPENDENT_AMBULATORY_CARE_PROVIDER_SITE_OTHER): Admitting: Nurse Practitioner
